# Patient Record
Sex: Male | Born: 1950 | Race: White | Hispanic: No | Smoking: Former smoker
Health system: Southern US, Community
[De-identification: ages and names within clinical notes are randomized; demographics above are authoritative.]

## PROBLEM LIST (undated history)

## (undated) DIAGNOSIS — E7800 Pure hypercholesterolemia, unspecified: Secondary | ICD-10-CM

## (undated) DIAGNOSIS — Z9289 Personal history of other medical treatment: Secondary | ICD-10-CM

## (undated) DIAGNOSIS — B001 Herpesviral vesicular dermatitis: Secondary | ICD-10-CM

## (undated) DIAGNOSIS — N529 Male erectile dysfunction, unspecified: Secondary | ICD-10-CM

## (undated) DIAGNOSIS — I10 Essential (primary) hypertension: Secondary | ICD-10-CM

## (undated) DIAGNOSIS — M1990 Unspecified osteoarthritis, unspecified site: Secondary | ICD-10-CM

## (undated) DIAGNOSIS — K635 Polyp of colon: Secondary | ICD-10-CM

## (undated) DIAGNOSIS — I209 Angina pectoris, unspecified: Secondary | ICD-10-CM

## (undated) DIAGNOSIS — Z85828 Personal history of other malignant neoplasm of skin: Secondary | ICD-10-CM

## (undated) DIAGNOSIS — F419 Anxiety disorder, unspecified: Secondary | ICD-10-CM

## (undated) DIAGNOSIS — C801 Malignant (primary) neoplasm, unspecified: Secondary | ICD-10-CM

## (undated) DIAGNOSIS — K219 Gastro-esophageal reflux disease without esophagitis: Secondary | ICD-10-CM

## (undated) DIAGNOSIS — I2510 Atherosclerotic heart disease of native coronary artery without angina pectoris: Secondary | ICD-10-CM

## (undated) HISTORY — DX: Male erectile dysfunction, unspecified: N52.9

## (undated) HISTORY — DX: Personal history of other medical treatment: Z92.89

## (undated) HISTORY — PX: SHOULDER SURGERY: SHX246

## (undated) HISTORY — PX: SKIN SURGERY: SHX2413

## (undated) HISTORY — PX: FINGER FRACTURE SURGERY: SHX638

## (undated) HISTORY — DX: Polyp of colon: K63.5

## (undated) HISTORY — PX: SKIN CANCER EXCISION: SHX779

## (undated) HISTORY — PX: CARDIAC CATHETERIZATION: SHX172

## (undated) HISTORY — PX: BACK SURGERY: SHX140

## (undated) HISTORY — PX: CERVICAL FUSION: SHX112

---

## 1998-08-23 ENCOUNTER — Ambulatory Visit (HOSPITAL_COMMUNITY): Admission: RE | Admit: 1998-08-23 | Discharge: 1998-08-23 | Payer: Self-pay | Admitting: Orthopedic Surgery

## 1998-08-23 ENCOUNTER — Encounter: Payer: Self-pay | Admitting: Orthopedic Surgery

## 1998-10-22 ENCOUNTER — Emergency Department (HOSPITAL_COMMUNITY): Admission: EM | Admit: 1998-10-22 | Discharge: 1998-10-22 | Payer: Self-pay | Admitting: Emergency Medicine

## 1999-08-21 ENCOUNTER — Emergency Department (HOSPITAL_COMMUNITY): Admission: EM | Admit: 1999-08-21 | Discharge: 1999-08-21 | Payer: Self-pay | Admitting: Emergency Medicine

## 2000-01-01 ENCOUNTER — Emergency Department (HOSPITAL_COMMUNITY): Admission: EM | Admit: 2000-01-01 | Discharge: 2000-01-01 | Payer: Self-pay | Admitting: *Deleted

## 2001-05-20 ENCOUNTER — Encounter: Payer: Self-pay | Admitting: Geriatric Medicine

## 2001-05-20 ENCOUNTER — Encounter: Admission: RE | Admit: 2001-05-20 | Discharge: 2001-05-20 | Payer: Self-pay | Admitting: Geriatric Medicine

## 2001-05-24 ENCOUNTER — Encounter: Payer: Self-pay | Admitting: Geriatric Medicine

## 2001-05-24 ENCOUNTER — Ambulatory Visit (HOSPITAL_COMMUNITY): Admission: RE | Admit: 2001-05-24 | Discharge: 2001-05-24 | Payer: Self-pay | Admitting: Geriatric Medicine

## 2002-03-24 ENCOUNTER — Encounter: Payer: Self-pay | Admitting: Orthopedic Surgery

## 2002-03-24 ENCOUNTER — Encounter: Admission: RE | Admit: 2002-03-24 | Discharge: 2002-03-24 | Payer: Self-pay | Admitting: Orthopedic Surgery

## 2002-03-26 ENCOUNTER — Encounter: Payer: Self-pay | Admitting: Orthopedic Surgery

## 2002-03-26 ENCOUNTER — Encounter: Admission: RE | Admit: 2002-03-26 | Discharge: 2002-03-26 | Payer: Self-pay | Admitting: Orthopedic Surgery

## 2003-11-04 ENCOUNTER — Encounter (INDEPENDENT_AMBULATORY_CARE_PROVIDER_SITE_OTHER): Payer: Self-pay | Admitting: Specialist

## 2003-11-04 ENCOUNTER — Ambulatory Visit (HOSPITAL_COMMUNITY): Admission: RE | Admit: 2003-11-04 | Discharge: 2003-11-04 | Payer: Self-pay | Admitting: Gastroenterology

## 2005-09-19 ENCOUNTER — Encounter: Admission: RE | Admit: 2005-09-19 | Discharge: 2005-09-19 | Payer: Self-pay | Admitting: Orthopedic Surgery

## 2005-09-20 ENCOUNTER — Encounter: Admission: RE | Admit: 2005-09-20 | Discharge: 2005-09-20 | Payer: Self-pay | Admitting: Orthopedic Surgery

## 2006-01-21 ENCOUNTER — Emergency Department (HOSPITAL_COMMUNITY): Admission: EM | Admit: 2006-01-21 | Discharge: 2006-01-22 | Payer: Self-pay | Admitting: Emergency Medicine

## 2006-09-21 ENCOUNTER — Ambulatory Visit (HOSPITAL_COMMUNITY)
Admission: RE | Admit: 2006-09-21 | Discharge: 2006-09-21 | Payer: Self-pay | Admitting: Physical Medicine and Rehabilitation

## 2007-01-04 ENCOUNTER — Ambulatory Visit (HOSPITAL_COMMUNITY): Admission: RE | Admit: 2007-01-04 | Discharge: 2007-01-06 | Payer: Self-pay | Admitting: Neurosurgery

## 2007-04-29 ENCOUNTER — Encounter: Admission: RE | Admit: 2007-04-29 | Discharge: 2007-04-29 | Payer: Self-pay | Admitting: Geriatric Medicine

## 2007-10-15 ENCOUNTER — Encounter: Admission: RE | Admit: 2007-10-15 | Discharge: 2007-10-15 | Payer: Self-pay | Admitting: Interventional Cardiology

## 2007-10-21 ENCOUNTER — Inpatient Hospital Stay (HOSPITAL_BASED_OUTPATIENT_CLINIC_OR_DEPARTMENT_OTHER): Admission: RE | Admit: 2007-10-21 | Discharge: 2007-10-21 | Payer: Self-pay | Admitting: Interventional Cardiology

## 2008-05-09 ENCOUNTER — Ambulatory Visit (HOSPITAL_COMMUNITY): Admission: RE | Admit: 2008-05-09 | Discharge: 2008-05-09 | Payer: Self-pay | Admitting: Orthopedic Surgery

## 2008-08-25 ENCOUNTER — Ambulatory Visit: Payer: Self-pay | Admitting: Family Medicine

## 2008-08-25 ENCOUNTER — Inpatient Hospital Stay (HOSPITAL_COMMUNITY): Admission: EM | Admit: 2008-08-25 | Discharge: 2008-08-26 | Payer: Self-pay | Admitting: Emergency Medicine

## 2008-11-07 DIAGNOSIS — Z9289 Personal history of other medical treatment: Secondary | ICD-10-CM

## 2008-11-07 HISTORY — DX: Personal history of other medical treatment: Z92.89

## 2008-11-20 ENCOUNTER — Ambulatory Visit: Payer: Self-pay | Admitting: *Deleted

## 2008-11-21 ENCOUNTER — Observation Stay (HOSPITAL_COMMUNITY): Admission: EM | Admit: 2008-11-21 | Discharge: 2008-11-23 | Payer: Self-pay | Admitting: Emergency Medicine

## 2009-01-04 ENCOUNTER — Encounter: Admission: RE | Admit: 2009-01-04 | Discharge: 2009-01-04 | Payer: Self-pay | Admitting: Neurosurgery

## 2009-03-22 ENCOUNTER — Encounter: Admission: RE | Admit: 2009-03-22 | Discharge: 2009-03-22 | Payer: Self-pay | Admitting: Orthopedic Surgery

## 2009-04-07 ENCOUNTER — Encounter: Admission: RE | Admit: 2009-04-07 | Discharge: 2009-04-07 | Payer: Self-pay | Admitting: Orthopedic Surgery

## 2009-04-13 ENCOUNTER — Inpatient Hospital Stay (HOSPITAL_COMMUNITY): Admission: RE | Admit: 2009-04-13 | Discharge: 2009-04-14 | Payer: Self-pay | Admitting: Neurosurgery

## 2010-10-13 LAB — CBC
HCT: 38.7 % — ABNORMAL LOW (ref 39.0–52.0)
MCV: 94 fL (ref 78.0–100.0)
Platelets: 160 10*3/uL (ref 150–400)
RBC: 4.11 MIL/uL — ABNORMAL LOW (ref 4.22–5.81)
RDW: 13.1 % (ref 11.5–15.5)
WBC: 4.5 10*3/uL (ref 4.0–10.5)

## 2010-10-13 LAB — BASIC METABOLIC PANEL
CO2: 31 mEq/L (ref 19–32)
Calcium: 9.3 mg/dL (ref 8.4–10.5)
Chloride: 106 mEq/L (ref 96–112)
Creatinine, Ser: 0.83 mg/dL (ref 0.4–1.5)
GFR calc Af Amer: >60 mL/min (ref >60)
Glucose, Bld: 93 mg/dL (ref 70–99)
Sodium: 142 mEq/L (ref 135–145)

## 2010-10-18 LAB — PROTIME-INR
INR: 1.1 (ref 0.00–1.49)
Prothrombin Time: 14.1 seconds (ref 11.6–15.2)

## 2010-10-18 LAB — BASIC METABOLIC PANEL
Calcium: 8.8 mg/dL (ref 8.4–10.5)
Chloride: 105 mEq/L (ref 96–112)
Creatinine, Ser: 0.91 mg/dL (ref 0.4–1.5)
GFR calc Af Amer: >60 mL/min (ref >60)

## 2010-10-18 LAB — CBC
MCHC: 34.8 g/dL (ref 30.0–36.0)
MCV: 89.8 fL (ref 78.0–100.0)
MCV: 88.7 fL (ref 78.0–100.0)
RBC: 3.92 MIL/uL — ABNORMAL LOW (ref 4.22–5.81)
RBC: 4.18 MIL/uL — ABNORMAL LOW (ref 4.22–5.81)
RDW: 12.7 % (ref 11.5–15.5)
WBC: 7.6 10*3/uL (ref 4.0–10.5)
WBC: 5 10*3/uL (ref 4.0–10.5)

## 2010-10-18 LAB — POCT CARDIAC MARKERS
CKMB, poc: <1 ng/mL — ABNORMAL LOW (ref 1.0–8.0)
Myoglobin, poc: 75 ng/mL (ref 12–200)

## 2010-10-18 LAB — LIPID PANEL
LDL Cholesterol: 84 mg/dL (ref 0–99)
Total CHOL/HDL Ratio: 2.7 RATIO
Triglycerides: 30 mg/dL (ref <150)
VLDL: 6 mg/dL (ref 0–40)

## 2010-10-18 LAB — DIFFERENTIAL
Lymphocytes Relative: 25 % (ref 12–46)
Lymphs Abs: 1.2 10*3/uL (ref 0.7–4.0)
Monocytes Relative: 8 % (ref 3–12)
Neutro Abs: 3.3 10*3/uL (ref 1.7–7.7)
Neutrophils Relative %: 67 % (ref 43–77)

## 2010-10-18 LAB — HEPARIN LEVEL (UNFRACTIONATED)
Heparin Unfractionated: <0.1 IU/mL — ABNORMAL LOW (ref 0.30–0.70)
Heparin Unfractionated: 0.54 IU/mL (ref 0.30–0.70)

## 2010-10-18 LAB — CARDIAC PANEL(CRET KIN+CKTOT+MB+TROPI)
CK, MB: 2.1 ng/mL (ref 0.3–4.0)
Relative Index: INVALID (ref 0.0–2.5)
Total CK: 68 U/L (ref 7–232)
Troponin I: 0.01 ng/mL (ref 0.00–0.06)

## 2010-10-25 LAB — TROPONIN I: Troponin I: <0.01 ng/mL (ref 0.00–0.06)

## 2010-10-25 LAB — POCT CARDIAC MARKERS
CKMB, poc: <1 ng/mL — ABNORMAL LOW (ref 1.0–8.0)
Troponin i, poc: <0.05 ng/mL (ref 0.00–0.09)

## 2010-10-25 LAB — CBC
MCHC: 34.9 g/dL (ref 30.0–36.0)
MCV: 89.9 fL (ref 78.0–100.0)
MCV: 89.3 fL (ref 78.0–100.0)
Platelets: 193 10*3/uL (ref 150–400)
Platelets: 191 10*3/uL (ref 150–400)
RDW: 13.3 % (ref 11.5–15.5)
RDW: 13.3 % (ref 11.5–15.5)
WBC: 7.9 10*3/uL (ref 4.0–10.5)
WBC: 7.4 10*3/uL (ref 4.0–10.5)

## 2010-10-25 LAB — COMPREHENSIVE METABOLIC PANEL
AST: 26 U/L (ref 0–37)
Albumin: 3.6 g/dL (ref 3.5–5.2)
Chloride: 105 mEq/L (ref 96–112)
Creatinine, Ser: 0.85 mg/dL (ref 0.4–1.5)
GFR calc Af Amer: >60 mL/min (ref >60)
Total Bilirubin: 0.4 mg/dL (ref 0.3–1.2)
Total Protein: 6 g/dL (ref 6.0–8.3)

## 2010-10-25 LAB — HEPATIC FUNCTION PANEL
ALT: 27 U/L (ref 0–53)
Bilirubin, Direct: <0.1 mg/dL (ref 0.0–0.3)
Total Protein: 6 g/dL (ref 6.0–8.3)

## 2010-10-25 LAB — BASIC METABOLIC PANEL
BUN: 8 mg/dL (ref 6–23)
BUN: 9 mg/dL (ref 6–23)
Calcium: 8.9 mg/dL (ref 8.4–10.5)
Creatinine, Ser: 0.88 mg/dL (ref 0.4–1.5)
Creatinine, Ser: 0.93 mg/dL (ref 0.4–1.5)
GFR calc non Af Amer: >60 mL/min (ref >60)
GFR calc non Af Amer: >60 mL/min (ref >60)
Glucose, Bld: 109 mg/dL — ABNORMAL HIGH (ref 70–99)
Glucose, Bld: 97 mg/dL (ref 70–99)

## 2010-10-25 LAB — DIFFERENTIAL
Basophils Absolute: 0 10*3/uL (ref 0.0–0.1)
Eosinophils Relative: 2 % (ref 0–5)
Lymphocytes Relative: 26 % (ref 12–46)
Lymphs Abs: 2.1 10*3/uL (ref 0.7–4.0)
Monocytes Absolute: 0.7 10*3/uL (ref 0.1–1.0)
Monocytes Relative: 9 % (ref 3–12)
Neutro Abs: 4.9 10*3/uL (ref 1.7–7.7)

## 2010-10-25 LAB — LIPID PANEL
HDL: 49 mg/dL (ref >39)
LDL Cholesterol: 98 mg/dL (ref 0–99)
Total CHOL/HDL Ratio: 3.3 RATIO
Triglycerides: 73 mg/dL (ref <150)
VLDL: 15 mg/dL (ref 0–40)

## 2010-10-25 LAB — MAGNESIUM: Magnesium: 2.3 mg/dL (ref 1.5–2.5)

## 2010-10-25 LAB — PROTIME-INR: INR: 1 (ref 0.00–1.49)

## 2010-10-25 LAB — HEPARIN LEVEL (UNFRACTIONATED): Heparin Unfractionated: 0.59 IU/mL (ref 0.30–0.70)

## 2010-10-25 LAB — CK TOTAL AND CKMB (NOT AT ARMC)
CK, MB: 0.8 ng/mL (ref 0.3–4.0)
Relative Index: INVALID (ref 0.0–2.5)
Total CK: 38 U/L (ref 7–232)

## 2010-10-25 LAB — APTT: aPTT: 86 seconds — ABNORMAL HIGH (ref 24–37)

## 2010-11-22 NOTE — H&P (Signed)
Edward [name], Edward [name] NO.:  [id number]   MEDICAL RECORD NO.:  [id number]          PATIENT TYPE:  INP   LOCATION:  6525                         FACILITY:  [facility]   PHYSICIAN:  Vernice Jefferson, MD          DATE OF BIRTH:  [birth year]   DATE OF ADMISSION:  08/24/2008  DATE OF DISCHARGE:                              HISTORY & PHYSICAL   REASON FOR ADMISSION:  Chest pain x3 days.   HISTORY OF PRESENT ILLNESS:  The patient is a 60 year old white male  with history of nonobstructive moderate coronary artery disease at last  cath in March 2009 who comes in with complaints of chest discomfort.  Reports that his chest discomfort has been in a crescendo pattern over  the past 3 days.  Reportedly gets chest pain about every 1-2 months;  however, over the past 3 days, the patient reports it has been occurring  about every 4-6 hours.  Reports this chest pain has been occurring with  rest and exertion.  Does not have any associated symptoms.  Additionally, he reports that chest pain has been somewhat relieved with  nitroglycerin occasionally.  Today, the chest pain was at its worse at  10 and presented to the ED and resolved while waiting here in the ED and  after receiving nitroglycerin.  The patient currently is chest pain  free.   PAST MEDICAL HISTORY:  1. Coronary artery disease status post left heart catheterization      secondary to abnormal nuclear study in April 2009.  At that time,      he had a 50-70% mid LAD, a 70% small OM-1 lesion, and a 50% mid      RCA.  His left ventricular systolic function was 75% by left      ventriculogram.  2. Hypertension.  3. DJD.  4. Depression.   SOCIAL HISTORY:  Lives in [place].  Negative for any smoking,  alcohol, or drug abuse.   FAMILY HISTORY:  Reviewed and is noncontributory of the patient's  current medical condition.   ALLERGIES:  No known drug allergies.   MEDICATIONS:  1. Aspirin 81 mg a day.  2. Protonix 40 mg a  day.  3. Zocor 80 mg nightly.  4. Avapro 40 mg a day.  5. Wellbutrin 50 mg a day.  6. Fish oil 1 g b.i.d.   REVIEW OF SYSTEMS:  Negative 11-point review of systems except for those  dictated in the above HPI.   PHYSICAL EXAMINATION:  VITAL SIGNS:  Blood pressure is 168/84, heart  rate of 115, respirations 12.  He is afebrile.  GENERAL:  Well-developed, well-nourished white man, in no acute  distress.  HEENT:  Moist mucous membranes.  No scleral icterus or conjunctival  pallor.  NECK:  Supple.  Full range of motion.  No jugular venous distention.  CARDIOVASCULAR:  Regular rate and rhythm.  No murmurs, rubs, or gallops.  CHEST:  Clear to auscultation bilaterally.  No wheezes, rales, or  rhonchi.  ABDOMEN: Soft, nontender, nondistended.  Normoactive bowel sounds.  EXTREMITIES:  No peripheral edema.  Pulses 2+ bilaterally.  NEURO:  Nonfocal.   His EKG demonstrates sinus tachycardia but no acute changes.  Chest x-  ray demonstrates no acute infiltrative process.   LABORATORY DATA:  Significant for hemoglobin of 14.4, white count of  7.9.  BUN and creatinine 11.83.  His first set of biomarkers are  negative.   IMPRESSION:  1. Acute coronary syndrome, unstable angina.  2. Nonobstructive coronary artery disease, but last left heart      catheterization were benign.  3. Hypertension.  4. Gastroesophageal reflux disease.   PLAN:  Admit the patient to hospital for Dr. Verdis Prime.  We will cycle  biomarkers, check a.m. EKG, and initiate heparin protocol for acute  coronary syndrome given possibility of acute plaque ruptures etiology of  her chest pain.  Pending biomarkers, eval an EKG.  May consider  noninvasive versus invasive risk stratification in the a.m.  We will  give him morphine and nitroglycerin as needed for pain.      Vernice Jefferson, MD  Electronically Signed     Vernice Jefferson, MD  Electronically Signed    JT/MEDQ  D:  08/25/2008  T:  08/25/2008  Job:  [id number]

## 2010-11-22 NOTE — Cardiovascular Report (Signed)
Edward [name], Edward [name] NO.:  [id number]   MEDICAL RECORD NO.:  [id number]          PATIENT TYPE:  INP   LOCATION:  6525                         FACILITY:  [facility]   PHYSICIAN:  Lyn Records, M.D.   DATE OF BIRTH:  Mar 19, 1951   DATE OF PROCEDURE:  08/25/2008  DATE OF DISCHARGE:                            CARDIAC CATHETERIZATION   INDICATIONS FOR PROCEDURE:  Progressive angina pectoris in this 60-year-  old gentleman with a history of moderate coronary artery disease  documented by prior catheterization within the past 12 months.   PROCEDURES PERFORMED:  1. Left heart catheterization.  2. Selective coronary angiography.  3. Left ventriculography.   DESCRIPTION:  A 6-French sheath was placed in the right femoral artery  using the modified Seldinger technique.  A 6-French A2 multipurpose  catheter was used for hemodynamic recordings, left ventriculography by  hand injection, and selective left and right coronary angiography.  We  used a #4 6-French left coronary catheter for left coronary angiography.  Intracoronary nitroglycerin 200 mcg was administered without  complications.  The case was terminated after reviewing the digital  images.   RESULTS:  1. Hemodynamic data:      a.     Aortic pressure 94/54.      b.     Left ventricular pressure 101/2 mmHg.  2. Left ventriculography:  The left ventricular cavity size is normal.      The systolic function is normal.  The ejection fraction is 60%.  No      mitral regurgitation is noted.  3. Coronary angiography.      a.     Left main coronary:  The left main coronary artery is       patent, but contains 30% proximal narrowing.      b.     Left anterior descending coronary:  The LAD is a large       vessel that reaches around the left ventricular apex.  It gives       origin to a very early first diagonal branch.  There is ostial       eccentric 60-70% narrowing in the first diagonal.  There is       diffuse mid  LAD narrowing up to 60-70% as well as diffuse second       diagonal narrowing.  No high-grade focal stenosis is noted.      c.     Circumflex artery:  The circumflex coronary artery gives       origin to 4 obtuse marginal branches.  The first 2 of which are       relatively large.  The second branch contains severe ostial and       proximal narrowing greater than 90%.  The fourth and fifth obtuse       marginal branches are large and free of significant obstruction.      d.     Right coronary:  The right coronary artery is dominant and       gives origin to a mid eccentric 40% narrowing.  The  PDA and left       ventricular branches are free of any significant obstruction.   CONCLUSIONS:  1. Moderately severe mid left anterior descending and second diagonal      atherosclerosis.  Moderate obstruction in the ostium of the large      first diagonal.  Moderate mid circumflex obstruction with severe      second obtuse marginal obstruction.  The second obtuse marginal is      small and could potentially be the source of the patient's pain.      There is mild-to-moderate mid right coronary artery disease.  When      compared to the prior study, there is      not a significant change.  2. Overall normal left ventricular function.   PLAN:  Intensify antianginal regimen by adding long-acting nitrate  therapy.      Lyn Records, M.D.  Electronically Signed     HWS/MEDQ  D:  08/25/2008  T:  08/25/2008  Job:  [id number]

## 2010-11-22 NOTE — Discharge Summary (Signed)
NAMEKEELYN, FJELSTAD NO.:  [id number]   MEDICAL RECORD NO.:  [id number]          PATIENT TYPE:  INP   LOCATION:  2313                         FACILITY:  [facility]   PHYSICIAN:  Lyn Records, M.D.   DATE OF BIRTH:  June 15, 1951   DATE OF ADMISSION:  11/20/2008  DATE OF DISCHARGE:  11/23/2008                               DISCHARGE SUMMARY   DISCHARGE DIAGNOSES:  1. Chest pain, resolved.  2. Coronary artery disease, status post Cardiolite this admission for      ischemia, negative.  3. Hyperlipidemia, treated.  4. Hypertension, treated.   HOSPITAL COURSE:  Mr. Gloster is a 60 year old male patient who has known  coronary artery disease.  He was recently discharged in February with  the cath showing a 25% left main, LAD with diffuse mid and diagonal  lesions up to 70%, a left circumflex was small with 60-70% stenosis.  The OM2 had 90% stenosis.  He was treated medically.   He is readmitted, and he states for chest pain and we did a Cardiolite  specifically in these areas were ischemic in nature, but they were not.  He was going to be discharged home, but he again started having chest  discomfort, but a GI cocktail seemed to help him.   MEDICATIONS:  We did have him on Lopressor 25 mg a day, he became  bradycardic in the 40s later that morning, which I assume was from his  sleep, but we will not go ahead and keep him on his medication.  Instead, I will increase his Imdur to 60 mg a day and he will otherwise  take these medications.  1. Enteric-coated aspirin 325 mg a day.  2. Bupropion HCl 150 mg 2 tablets daily.  3. Protonix 40 mg a day.  4. Avapro 150 mg a day.  5. Isosorbide mononitrate 30 mg 2 tablets daily.  6. Hydrocodone p.r.n.  7. Zocor 80 mg p.o. daily.  8. Sublingual nitroglycerin p.r.n. pain.  9. Ambien 10 mg as needed for sleep.   DISCHARGE INSTRUCTIONS:  The patient is to remain on low-sodium, heart-  healthy diet.  Return to work on Nov 24, 2008.   To remain on low-sodium,  heart-healthy diet.  Increase activity slowly.  Follow up with Dr.  Effie Shy, nurse practitioner on Nov 26, 2008, at 2:40 p.m.      Guy Franco, P.A.      Lyn Records, M.D.  Electronically Signed    LB/MEDQ  D:  11/23/2008  T:  11/23/2008  Job:  [id number]   cc:   Lyn Records, M.D.

## 2010-11-22 NOTE — Discharge Summary (Signed)
Edward [name], Edward [name] NO.:  [id number]   MEDICAL RECORD NO.:  [id number]          PATIENT TYPE:  INP   LOCATION:  6525                         FACILITY:  [facility]   PHYSICIAN:  Lyn Records, M.D.   DATE OF BIRTH:  12-07-1950   DATE OF ADMISSION:  08/24/2008  DATE OF DISCHARGE:  08/26/2008                               DISCHARGE SUMMARY   DISCHARGE DIAGNOSES:  1. Chest pain, resolved.  2. Coronary artery disease, medical management.  3. Hypertension.  4. Degenerative joint disease.  5. Hyperlipidemia.   HOSPITAL COURSE:  [name] is a 60 year old male patient who has had  crescendo angina complaint.  He was admitted to the hospital and his EKG  was unremarkable.  Lab studies showed cardiac markers which were  negative.  He does have total cholesterol of 162, LDL 98, HDL 49,  triglycerides 93.  Sodium 138, potassium 3.9, BUN 9, creatinine 0.93.  Hemoglobin 14.7, hematocrit 42, white count 7.4, platelets 191.  TSH  2.415.  Because of his complaint and he has had a past cath by [facility] several years ago and had nonobstructive disease, we felt  that it was prudent to go ahead and restudy him.   Cardiac catheterization showed normal EF.  Left main 25%, LAD with  diffuse proximal, and mid up to 70% stenosis with a first diagonal with  a 70% ostial stenosis.  The circumflex had an OM2 with a 90% stenosis  and had a small mid circumflex lesion 60-70%.  RCA had 50% mid stenosis.  Dr. Katrinka Blazing felt that the patient had moderate coronary artery disease,  but felt that this could be managed medically and thought placing him on  Imdur.   He was discharged to home on August 26, 2008, in stable, but improved  condition.   DISCHARGE MEDICATIONS:  1. Imdur 30 mg a day.  2. Sublingual nitroglycerin p.r.n. chest pain.  3. Enteric-coated aspirin 325 mg a day.  4. Protonix 40 mg a day.  5. Zocor 80 mg a day.  6. Avapro 150 mg a day.  7. Fish oil daily.  8.  Wellbutrin daily.   Remain on a low-sodium, heart-healthy diet.  Clean cath site gently with  soap and water without scrubbing.  Increase activity slowly.  No lifting  over 10 pounds for 1 week.  No driving for 2 days.  Follow up with Dr.  Effie Shy, nurse practitioner on September 09, 2008 at 9:30 a.m.      Guy Franco, P.A.      Lyn Records, M.D.  Electronically Signed    LB/MEDQ  D:  08/26/2008  T:  08/26/2008  Job:  [id number]

## 2010-11-22 NOTE — Op Note (Signed)
NAMECALI, [name] NO.:  [id number]   MEDICAL RECORD NO.:  [id number]          PATIENT TYPE:  OIB   LOCATION:  5153                         FACILITY:  [facility]   PHYSICIAN:  Danae Orleans. Venetia Maxon, M.D.  DATE OF BIRTH:  09/01/1950   DATE OF PROCEDURE:  01/04/2007  DATE OF DISCHARGE:                               OPERATIVE REPORT   PREOPERATIVE DIAGNOSIS:  Herniated lumbar disc L5-S1, right, with  spondylosis, degenerative disease, and radiculopathy.   POSTOPERATIVE DIAGNOSIS:  Herniated lumbar disc L5-S1, right, with  spondylosis, degenerative disease, and radiculopathy.   PROCEDURE:  Right L5-S1 microdiscectomy with microdissection.   ASSISTANT:  Danae Orleans. Venetia Maxon, M.D.   ASSISTANT:  Cristi Loron, M.D.   ANESTHESIA:  General endotracheal anesthesia.   BLOOD LOSS:  Minimal.   COMPLICATIONS:  None.   DISPOSITION:  Recovery.   INDICATIONS:  Edward [name] is a 60 year old man with a right L5-S1 disc  herniation with severe right leg pain.  It was elected to take him to  surgery for right L5-S1 microdiscectomy.   DESCRIPTION OF PROCEDURE:  Edward [name] was brought to the operating room.  Following the satisfactory and uncomplicated induction of general  endotracheal anesthesia and placement of intravenous lines, the patient  was placed in the prone position on the operating table.  His low back  was shaved then prepped and draped in the usual sterile fashion.  The  area of planned incision was infiltrated with 0.25% Marcaine, 0.5%  lidocaine, 1:200,000 epinephrine.  An incision was made in the midline  and carried to the lumbodorsal fascia which was incised to the right  side of midline.  Subperiosteal dissection was performed exposing the L5-  S1 interspace.  Intraoperative x-ray confirmed correct orientation with  the marker probe at the L5-S1 level.  Subsequently, a hemilaminectomy of  L5 was performed with removal of inferior and lateral aspect of the L5  lamina.  A foraminotomy was performed overlying the superior aspect of  the sacrum. The ligamentum flavum was then detached and removed in a  piecemeal fashion.  The lateral recess was also decompressed.  The  microscope was brought into field. Using microdissection technique, the  S1 nerve root was mobilized medially exposing a thinly contained  fragment of herniated disc material directly beneath the take off of the  S1 nerve root.  This fragment of disc material was then removed.  This  lead directly into the interspace and the disc was quite degenerated.  Multiple additional fragments were removed.  The lateral and medial  aspects of the interspace were also decompressed.  Hemostasis was  assured.  The disc space was irrigated with no evidence of residual disc  material.  The operative site was then bathed in Depo-Medrol and  fentanyl.  The lumbodorsal fascia was closed with 0 Vicryl sutures, the  subcutaneous tissues were approximated 2-0  Vicryl interrupted inverted sutures, and skin edges were approximated 3-  0 Vicryl subcuticular stitch.  The wound was dressed with Dermabond.  The patient was extubated in the operating room and taken to  the  recovery room in stable satisfactory having tolerated the operation  well.  Counts were correct at the end of the case.      Danae Orleans. Venetia Maxon, M.D.  Electronically Signed     JDS/MEDQ  D:  01/04/2007  T:  01/05/2007  Job:  [id number]

## 2010-11-22 NOTE — Cardiovascular Report (Signed)
NAMEJENNINGS, CORADO NO.:  [id number]   MEDICAL RECORD NO.:  [id number]          PATIENT TYPE:  OIB   LOCATION:  1962                         FACILITY:  [facility]   PHYSICIAN:  Lyn Records, M.D.   DATE OF BIRTH:  1951-02-14   DATE OF PROCEDURE:  10/21/2007  DATE OF DISCHARGE:  10/21/2007                            CARDIAC CATHETERIZATION   INDICATION:  Atypical chest pain with Cardiolite study done on  08/28/2007 with perfusion abnormalities in the mid-anterior wall and  inferobasal region.  Normal LVEF.  The study is being done to define  coronary anatomy given the patient's risk factors including a strong  family history of premature atherosclerosis.   PROCEDURES PERFORMED:  1. Left heart catheterization.  2. Selective coronary angiography.  3. Left ventriculography.  4. Intracoronary nitroglycerin.   DESCRIPTION:  After informed consent, a 4-French sheath was placed in  the right femoral artery using modified Seldinger technique.  A 4-French  A2 multipurpose catheter was used for hemodynamic recordings, left  ventriculography by hand injection, and selective right coronary  angiography.  We used a JL4 and a JR4, 4-French catheters for left and  right coronary angiography respectively.  200 mcg of intracoronary  nitroglycerin was administered into the left coronary.  The patient  tolerated the procedure without complications.   The patient received 3 mg of IV Versed and 50 mcg of fentanyl for  sedation.  Xylocaine 1% was used for local anesthesia.   Bimanual compression was used for hemostasis with good results.   RESULTS:  1. Hemodynamic data:      a.     Aortic pressure 142/74.      b.     Left ventricular pressure 141/12.  2. Left ventriculography:  The LV cavity size and function are normal.      EF is 75%.  3. Coronary angiography.      a.     Left main coronary:  Left main coronary artery is widely       patent.      b.     Left anterior  descending coronary:  Left anterior descending       coronary artery is large.  It divides into a large first diagonal       that really almost has the distribution of the ramus intermedius       branch.  There is also a moderate-sized second diagonal.  The LAD       in the mid segment is moderately and acutely diseased.  The first       diagonal contains ostial 50% narrowing.  The LAD beyond the first       diagonal contains diffuse 50-70% narrowing.  The second diagonal       beyond the paracentral periphery origin of the LAD contains       moderate diffuse disease, but no high-grade obstruction.  No high-       grade focal obstructions were noted in the LAD or either the large       diagonal.  c.     Circumflex artery:  The circumflex coronary artery is large.       It gives origin to three obtuse marginal branches.  The first       obtuse marginal branch contains 70% ostial and proximal narrowing.       Of the obtuse marginal branches noted, the first, second, and       third, which begin to arise from the circumflex are all equal in       size.  Left atrial recurrent branch arises from the mid       circumflex.      d.     Right coronary:  The right coronary artery contains an       eccentric 50% mid vessel stenosis.  It gives origin to PDA branch       and three left ventricular branches.  No significant obstructive       lesions were noted in the distal vessel.   CONCLUSIONS:  1. The patient has moderately severe coronary atherosclerosis without      focal high-grade obstruction in the major epicardial coronaries.      The involvement includes primarily the mid right coronary, the mid      LAD, and the mid circumflex.  There is high-grade obstruction in      the small first obtuse marginal and also 50% obstruction in the      first diagonal, which is a large vessel that rises near the ostium      of the left main.  2. Normal left ventricular function.   PLAN:  Aggressive  risk factor modification.  Nitropress for episodes of  chest discomfort, as the patient would be predisposed to endothelial  dysfunction.      Lyn Records, M.D.  Electronically Signed     HWS/MEDQ  D:  10/21/2007  T:  10/22/2007  Job:  [id number]

## 2010-11-22 NOTE — H&P (Signed)
NAMETAL, KEMPKER NO.:  [id number]   MEDICAL RECORD NO.:  [id number]          PATIENT TYPE:  INP   LOCATION:  2313                         FACILITY:  [facility]   PHYSICIAN:  Jennelle Human. Marisue Humble, MD DATE OF BIRTH:  [birth year]   DATE OF ADMISSION:  11/20/2008  DATE OF DISCHARGE:                              HISTORY & PHYSICAL   CARDIOLOGIST:  Lyn Records, MD, [facility].   CHIEF COMPLAINT:  Chest pain.   HISTORY OF PRESENT ILLNESS:  This is a 60 year old gentleman with known  moderate coronary artery disease who started having chest pain around  7:00 p.m. with which nitro did not relieve.  He felt short of breath and  diaphoretic.  He later approximately an hour later to be exact took an  aspirin 325 and another nitroglycerin without any benefit.  He left the  function that he was at and felt presyncopal on the drive home, so he  stopped at a fire station where he was subsequently given nitroglycerin  again and brought in via ambulance.  His pain is now down to 2-3 from an  8/10 on nitroglycerin drip and heparin.   PAST MEDICAL HISTORY:  1. Moderate-to-severe coronary artery disease.  He was recently      hospitalized in February for unstable angina.  At this time, he was      found to have a 25% left main and LAD with diffuse proximal and mid      disease up to 70% with the first diagonal of 70% as well.  His      circumflex was small in the midportion approximately 60-70%, and he      had an OM-2 that was small and approximately 90% stenosed.  His RCA      had 50% mid stenosis.  At that time, no PCI was done and he was      medically managed.  2. Hypertension.  3. Degenerative joint disease.  4. Hyperlipidemia.   SOCIAL HISTORY:  Lives in [place].  No tobacco or alcohol products.   FAMILY HISTORY:  With known coronary artery disease.   REVIEW OF SYSTEMS:  Negative x10 except that stated in the HPI.   ALLERGIES:  No allergies.    MEDICATIONS:  1. Imdur 30 mg daily.  2. Aspirin 325 mg daily.  3. Protonix 40 mg daily.  4. Zocor 80 mg daily.  5. Avapro 150 mg daily.  6. Fish oil 1 g daily.  7. Wellbutrin 300 mg daily.   PHYSICAL EXAMINATION:  VITAL SIGNS:  Currently afebrile, pulse is 75,  respirations 16, blood pressure 116/60.  HEENT:  Normal.  NECK:  Normal jugular venous pressure.  Carotid upstrokes normal.  No  bruits.  CARDIOVASCULAR:  Regular rate and rhythm without murmurs, gallops, or  rubs.  LUNGS:  Clear to auscultation bilaterally.  ABDOMEN:  Soft, nondistended, nontender.  Good bowel sounds.  No  hepatosplenomegaly.  EXTREMITIES:  No clubbing, cyanosis, or edema.  Dorsalis pedis,  posterior tibial pulses are normal.   Chest x-ray, normal.  ECG shows normal  sinus rhythm without any evidence  of ST or T-wave changes.   Labs show a white count of 5 and H and H of 12.8 and 37, platelets 166.  Sodium is 138, potassium 3.4, chloride 105, bicarb 23, BUN and  creatinine is 11 and 0.91.  INR is 1.1.  First set of cardiac enzymes  are negative.   ASSESSMENT AND PLAN:  1. Unstable angina.  We will continue to cycle his cardiac enzymes.      He is currently on a heparin drip and has been loaded with aspirin.      He has been treated with Imdur at home; however, currently he is on      a nitroglycerin drip.  In addition, I will add a beta-blocker to      his regimen.  2. Hypertension, currently controlled.  3. Hyperlipidemia.  We will check fasting lipid panel in the morning.  4. Followup.  Currently, he will be placed on Stepdown since he is on      a nitroglycerin drip and be closely monitored.  We will follow up      on his cardiac enzymes.      Jennelle Human Marisue Humble, MD  Electronically Signed     GBS/MEDQ  D:  11/21/2008  T:  11/21/2008  Job:  [id number]

## 2010-11-25 NOTE — Op Note (Signed)
NAME:  Edward [name], Edward [name]                             ACCOUNT NO.:  [id number]   MEDICAL RECORD NO.:  [id number]                   PATIENT TYPE:  AMB   LOCATION:  ENDO                                 FACILITY:  [facility]   PHYSICIAN:  Danise Edge, M.D.                DATE OF BIRTH:  07/10/1951   DATE OF PROCEDURE:  11/04/2003  DATE OF DISCHARGE:                                 OPERATIVE REPORT   PROCEDURE:  Esophagogastroduodenoscopy, colonoscopy and polypectomy.   INDICATIONS:  Edward [name] is a 60 year old male, born 1950-12-28.  Mr.  Edward [name] has chronic gastroesophageal reflux manifested by heartburn.  He is  taking a proton pump inhibitor.  When he takes the proton pump inhibitor, he  does not experience heartburn, dysphagia or odynophagia.   Edward [name] has undergone hemorrhoid surgery in the past.  He has passed fresh  blood with otherwise normal bowel movements.  He is scheduled to undergo his  first screening colonoscopy with polypectomy to prevent colon cancer.   ENDOSCOPIST:  Danise Edge, M.D.   PREMEDICATION:  Versed 10 mg, Demerol 100 mg for both procedures.   ESOPHAGOGASTRODUODENOSCOPY:  After obtaining informed consent, Edward [name] was  placed on the left lateral decubitus position.  I administered intravenous  Demerol and intravenous Versed to achieve conscious sedation for the  procedure.  The patient's blood pressure, oxygen saturation and cardiac  rhythm were monitored throughout the procedure and documented in the medical  record.   The Olympus gastroscope was passed through the posterior hypopharynx into  the proximal esophagus without difficulty.  The hypopharynx, larynx and  vocal cords appeared normal.   Esophagoscopy:  The proximal, mid and lower segments of the esophageal  mucosa appear normal.  The squamocolumnar junction and esophagogastric junction are noted at 40 cm  from the incisor teeth.  Endoscopically there is no evidence for the  presence of  erosive esophagitis, Barrett's esophagus, esophageal mucosal  scarring, or esophageal obstruction.   Gastroscopy:  Retroflexed view of the gastric cardia and fundus was normal.  The gastric body, antrum and pylorus appeared normal.   Duodenoscopy:  The duodenal bulb, mid duodenum and distal duodenum appeared  normal.   ASSESSMENT:  Chronic gastroesophageal reflux associated with a completely  normal esophagogastroduodenoscopy.   PROCTOCOLONOSCOPY WITH POLYPECTOMY:  Anal inspection and digital rectal exam  were normal.  The prostate was nonnodular.  The Olympus adjustable pediatric  colonoscope was introduced into the rectum and advanced to the cecum.  Colonic preparation for the exam today was excellent.   Rectum:  From the distal rectum, a 2 mm sessile polyp was removed with the  electrocautery snare.  Sigmoid colon and descending colon:  From the distal sigmoid colon, a 1 mm  sessile polyp was removed with the cold biopsy forceps.  Splenic flexure:  Normal.  Transverse colon:  Normal.  Hepatic flexure:  Normal.  Ascending colon:  Normal.  Cecum and ileocecal valve:  Normal.   ASSESSMENT:  A small polyp was removed from the distal rectum and a  diminutive polyp was removed from the distal sigmoid colon.  There is no  endoscopic evidence for the presence of colorectal cancer.  There is no  lower gastrointestinal bleeding.   RECOMMENDATIONS:  Repeat colonoscopy in five years if polyps return  neoplastic pathologically.                                               Danise Edge, M.D.    MJ/MEDQ  D:  11/04/2003  T:  11/04/2003  Job:  [id number]   cc:   Hal T. Stoneking, M.D.  [address] [place], [place] [address]  Fax: [phone]

## 2011-04-26 LAB — CBC
Platelets: 241
RDW: 12.6
WBC: 6.3

## 2011-05-31 NOTE — H&P (Signed)
Natale Milch DOB: February 22, 1951 Single / Language: Undefined / Race: Undefined Male   History of Present Illness The patient is a 60 year old male who presents today for follow up of their knee. The patient is being followed for their right knee pain. Symptoms reported today include: pain. The patient feels that they are doing well (The injection helped some but he still has pain.). The patient presents today following MRI.    Subjective  Natale Milch came in. His knee is still locked on the right. He can not totally extend his knee. He said he feels like something is catching in his knee.   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 12/22/1992 Pain in joint, shoulder (719.41). 03/27/1997 Disorder, shoulder region NEC (726.2). 06/16/1997 Sprain/strain, shoulder/arm NEC (840.8). 09/21/1997 Sprain/strain, rotator cuff (840.4). 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 02/05/1998 Degeneration, cervical disc (722.4). 08/19/1998 Pain in joint, upper arm (719.42). 09/28/1998 Epicondylitis, lateral (726.32). 10/21/1998 Wound open, finger w/o complication (883.0). 01/02/2000 Lumbago (724.2). 12/14/2006   Allergies No Known Drug Allergies.    Family History Cerebrovascular Accident. sister Depression. sister Congestive Heart Failure. mother and sister Heart Disease. mother and sister Diabetes Mellitus. sister Heart disease in male family member before age 58 Rheumatoid Arthritis. mother Hypertension. mother and sister   Social History Drug/Alcohol Rehab (Currently). 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  no Exercise. Exercises daily; does running / walking Children. 2 Alcohol use. former drinker Marital status. divorced Pain Contract. yes Number of flights of stairs before winded. 2-3 Tobacco use. Never smoker. former smoker; smoke(d) less than 1/2 pack(s) per day No alcohol use   Medication History Vicodin (5-500MG  Tablet, Oral) Active. Aspirin ( Oral) Specific dose unknown - Active. BuPROPion HCl (SR) (150MG  Tablet ER 12HR, Oral) Active. Protonix ( Oral) Specific dose unknown - Active. Avapro (150MG  Tablet, Oral) Active. Isosorbide Mononitrate CR (30MG  Tablet ER 24HR, Oral) Active. Fish Oil Active. Crestor ( Oral) Specific dose unknown - Active. CoQ10 Maximum Strength ( Oral) Specific dose unknown - Active. Multivitamin ( Oral) Specific dose unknown - Active. Nitroglycerin CR ( Oral) Specific dose unknown - Active.   Past Surgical History Spinal Surgery Spinal Decompression. neck and lower back Anal Fissure Repair Arthroscopy of Shoulder. left Rotator Cuff Repair. bilateral Neck Disc Surgery   Other Problems Migraine Headache Hypercholesterolemia Skin Cancer Coronary artery disease Anxiety Disorder Depression High blood pressure Gastroesophageal Reflux Disease   Objective  He has a knee effusion. His collateral ligaments and cruciates are intact. Popliteal space is normal. Circulation is intact. Calves soft, nontender, no phlebitis. Hip is negative.    RADIOGRAPHS: I went through the MRI report here. He has a flap tear of the posterior horn of the medial meniscus. He has some arthritic changes in the knee, about 50% of cartilage thickness of the femoral trochlear groove is involved. He has a large knee effusion, 6 mm wide posterior intra-articular osteochondral loose body.    Plans  He is going to need to have as we discussed arthroscopic medial meniscectomy and clean out of the right knee, general anesthesia. We went over the possible  complications today which are rare such as infection, blood clots, etc. As I mentioned to Sahli those are extremely rare. The patient wants to wait until after Christmas so we will write  this all up and have it ready. He will buy a bottle of adult aspirin 325 mg. He will start taking that after surgery. I do not want him on aspirin at least two to three days prior to surgery.    Jacki Cones, MD

## 2011-06-02 ENCOUNTER — Encounter (HOSPITAL_COMMUNITY): Payer: Self-pay

## 2011-06-12 ENCOUNTER — Other Ambulatory Visit: Payer: Self-pay

## 2011-06-12 ENCOUNTER — Ambulatory Visit (HOSPITAL_COMMUNITY)
Admission: RE | Admit: 2011-06-12 | Discharge: 2011-06-12 | Disposition: A | Discharge status: Home / Self Care | Payer: Medicare Other | Source: Ambulatory Visit | Attending: Orthopedic Surgery | Admitting: Orthopedic Surgery

## 2011-06-12 ENCOUNTER — Encounter (HOSPITAL_COMMUNITY)
Admission: RE | Admit: 2011-06-12 | Discharge: 2011-06-12 | Disposition: A | Discharge status: Home / Self Care | Payer: Medicare Other | Source: Ambulatory Visit | Attending: Orthopedic Surgery | Admitting: Orthopedic Surgery

## 2011-06-12 ENCOUNTER — Encounter (HOSPITAL_COMMUNITY): Payer: Self-pay

## 2011-06-12 HISTORY — DX: Anxiety disorder, unspecified: F41.9

## 2011-06-12 HISTORY — DX: Gastro-esophageal reflux disease without esophagitis: K21.9

## 2011-06-12 HISTORY — DX: Unspecified osteoarthritis, unspecified site: M19.90

## 2011-06-12 HISTORY — DX: Essential (primary) hypertension: I10

## 2011-06-12 HISTORY — DX: Pure hypercholesterolemia, unspecified: E78.00

## 2011-06-12 HISTORY — DX: Atherosclerotic heart disease of native coronary artery without angina pectoris: I25.10

## 2011-06-12 HISTORY — DX: Malignant (primary) neoplasm, unspecified: C80.1

## 2011-06-12 LAB — CBC
HCT: 39.2 % (ref 39.0–52.0)
Hemoglobin: 13.8 g/dL (ref 13.0–17.0)
RDW: 12.8 % (ref 11.5–15.5)
WBC: 5.1 10*3/uL (ref 4.0–10.5)

## 2011-06-12 LAB — COMPREHENSIVE METABOLIC PANEL
ALT: 15 U/L (ref 0–53)
AST: 16 U/L (ref 0–37)
Alkaline Phosphatase: 53 U/L (ref 39–117)
CO2: 27 mEq/L (ref 19–32)
Calcium: 9.4 mg/dL (ref 8.4–10.5)
GFR calc Af Amer: >90 mL/min (ref >90)
GFR calc non Af Amer: >90 mL/min (ref >90)
Glucose, Bld: 79 mg/dL (ref 70–99)
Potassium: 4.2 mEq/L (ref 3.5–5.1)
Sodium: 142 mEq/L (ref 135–145)

## 2011-06-12 LAB — SURGICAL PCR SCREEN
MRSA, PCR: NEGATIVE
Staphylococcus aureus: NEGATIVE

## 2011-06-12 LAB — DIFFERENTIAL
Basophils Absolute: 0 10*3/uL (ref 0.0–0.1)
Basophils Relative: 0 % (ref 0–1)
Lymphocytes Relative: 27 % (ref 12–46)
Monocytes Absolute: 0.5 10*3/uL (ref 0.1–1.0)
Monocytes Relative: 10 % (ref 3–12)
Neutro Abs: 3.1 10*3/uL (ref 1.7–7.7)
Neutrophils Relative %: 62 % (ref 43–77)

## 2011-06-12 LAB — URINALYSIS, ROUTINE W REFLEX MICROSCOPIC
Bilirubin Urine: NEGATIVE
Glucose, UA: NEGATIVE mg/dL
Hgb urine dipstick: NEGATIVE
Protein, ur: NEGATIVE mg/dL
Specific Gravity, Urine: 1.026 (ref 1.005–1.030)
Urobilinogen, UA: 0.2 mg/dL (ref 0.0–1.0)

## 2011-06-12 NOTE — Patient Instructions (Addendum)
20 Dashun LEE DEMITRIS POKORNY  06/12/2011   Your procedure is scheduled on:  12/04/121  Report to [facility] at 10:30 AM.  Call this number if you have problems the morning of surgery: [phone]   Remember:   Do not eat food:After Midnight.  May have clear liquids: up to 4 Hours before arrival.  Clear liquids include soda, tea, black coffee, apple or grape juice, broth.  Take these medicines the morning of surgery with A SIP OF WATER: WELLBUTRIN / IMDUR / RANEXA / VICODIN IF NEEDED   Do not wear jewelry, make-up or nail polish.  Do not wear lotions, powders, or perfumes. You may wear deodorant.  Do not shave 48 hours prior to surgery.  Do not bring valuables to the hospital.  Contacts, dentures or bridgework may not be worn into surgery.  Leave suitcase in the car. After surgery it may be brought to your room.  For patients admitted to the hospital, checkout time is 11:00 AM the day of discharge.   Patients discharged the day of surgery will not be allowed to drive home.  Name and phone number of your driver:   Special Instructions: CHG Shower Use Special Wash: 1/2 bottle night before surgery and 1/2 bottle morning of surgery.   Please read over the following fact sheets that you were given: MRSA Information

## 2011-06-13 ENCOUNTER — Encounter (HOSPITAL_COMMUNITY): Payer: Self-pay | Admitting: *Deleted

## 2011-06-13 ENCOUNTER — Ambulatory Visit (HOSPITAL_COMMUNITY)
Admission: RE | Admit: 2011-06-13 | Discharge: 2011-06-13 | Disposition: A | Discharge status: Home / Self Care | Payer: Medicare Other | Source: Ambulatory Visit | Attending: Orthopedic Surgery | Admitting: Orthopedic Surgery

## 2011-06-13 ENCOUNTER — Ambulatory Visit (HOSPITAL_COMMUNITY): Payer: Medicare Other | Admitting: Anesthesiology

## 2011-06-13 ENCOUNTER — Encounter (HOSPITAL_COMMUNITY): Payer: Self-pay | Admitting: Anesthesiology

## 2011-06-13 ENCOUNTER — Encounter (HOSPITAL_COMMUNITY)
Admission: RE | Disposition: A | Discharge status: Home / Self Care | Payer: Self-pay | Source: Ambulatory Visit | Attending: Orthopedic Surgery

## 2011-06-13 ENCOUNTER — Ambulatory Visit: Admit: 2011-06-13 | Payer: Self-pay | Admitting: Orthopedic Surgery

## 2011-06-13 DIAGNOSIS — M2240 Chondromalacia patellae, unspecified knee: Secondary | ICD-10-CM | POA: Insufficient documentation

## 2011-06-13 DIAGNOSIS — M23205 Derangement of unspecified medial meniscus due to old tear or injury, unspecified knee: Secondary | ICD-10-CM | POA: Diagnosis present

## 2011-06-13 DIAGNOSIS — M1710 Unilateral primary osteoarthritis, unspecified knee: Secondary | ICD-10-CM | POA: Insufficient documentation

## 2011-06-13 HISTORY — PX: KNEE ARTHROSCOPY: SHX127

## 2011-06-13 SURGERY — ARTHROSCOPY, KNEE
Anesthesia: General | Site: Knee | Laterality: Right

## 2011-06-13 SURGERY — ARTHROSCOPY, KNEE
Anesthesia: General | Site: Knee | Laterality: Right | Wound class: Clean

## 2011-06-13 MED ORDER — ONDANSETRON HCL 4 MG/2ML IJ SOLN
INTRAMUSCULAR | Status: AC
  Administered 2011-06-13: 4 mg
  Filled 2011-06-13: qty 2

## 2011-06-13 MED ORDER — OXYCODONE-ACETAMINOPHEN 10-650 MG PO TABS: 1.0000 | ORAL_TABLET | Freq: Four times a day (QID) | ORAL | Status: AC | PRN

## 2011-06-13 MED ORDER — ASPIRIN 325 MG PO TABS
325.0000 mg | ORAL_TABLET | Freq: Two times a day (BID) | ORAL | Status: DC
  Filled 2011-06-13 (×3): qty 1

## 2011-06-13 MED ORDER — FENTANYL CITRATE 0.05 MG/ML IJ SOLN
25.0000 ug | INTRAMUSCULAR | Status: DC | PRN
  Administered 2011-06-13: 25 ug via INTRAVENOUS
  Administered 2011-06-13: 50 ug via INTRAVENOUS
  Administered 2011-06-13: 25 ug via INTRAVENOUS
  Administered 2011-06-13: 50 ug via INTRAVENOUS

## 2011-06-13 MED ORDER — FENTANYL CITRATE 0.05 MG/ML IJ SOLN
INTRAMUSCULAR | Status: AC
  Filled 2011-06-13: qty 2

## 2011-06-13 MED ORDER — PROMETHAZINE HCL 25 MG/ML IJ SOLN: 6.2500 mg | INTRAMUSCULAR | Status: DC | PRN

## 2011-06-13 MED ORDER — BACITRACIN ZINC 500 UNIT/GM EX OINT
TOPICAL_OINTMENT | CUTANEOUS | Status: DC | PRN
  Administered 2011-06-13: 1 via TOPICAL

## 2011-06-13 MED ORDER — LACTATED RINGERS IR SOLN
Status: DC | PRN
  Administered 2011-06-13: 3000 mL
  Administered 2011-06-13: 6000 mL

## 2011-06-13 MED ORDER — LACTATED RINGERS IV SOLN
INTRAVENOUS | Status: DC | PRN
  Administered 2011-06-13 (×2): via INTRAVENOUS

## 2011-06-13 MED ORDER — PROPOFOL 10 MG/ML IV BOLUS
INTRAVENOUS | Status: DC | PRN
  Administered 2011-06-13: 180 mg via INTRAVENOUS

## 2011-06-13 MED ORDER — CEFAZOLIN SODIUM 1-5 GM-% IV SOLN
INTRAVENOUS | Status: AC
  Filled 2011-06-13: qty 100

## 2011-06-13 MED ORDER — ACETAMINOPHEN 10 MG/ML IV SOLN
INTRAVENOUS | Status: DC | PRN
  Administered 2011-06-13: 1000 mg via INTRAVENOUS

## 2011-06-13 MED ORDER — LIDOCAINE HCL (CARDIAC) 20 MG/ML IV SOLN
INTRAVENOUS | Status: DC | PRN
  Administered 2011-06-13: 100 mg via INTRAVENOUS

## 2011-06-13 MED ORDER — MIDAZOLAM HCL 5 MG/5ML IJ SOLN
INTRAMUSCULAR | Status: DC | PRN
  Administered 2011-06-13: 2 mg via INTRAVENOUS

## 2011-06-13 MED ORDER — LACTATED RINGERS IV SOLN: INTRAVENOUS | Status: DC

## 2011-06-13 MED ORDER — ACETAMINOPHEN 10 MG/ML IV SOLN
INTRAVENOUS | Status: AC
  Filled 2011-06-13: qty 100

## 2011-06-13 MED ORDER — BUPIVACAINE-EPINEPHRINE PF 0.25-1:200000 % IJ SOLN
INTRAMUSCULAR | Status: AC
  Filled 2011-06-13: qty 30

## 2011-06-13 MED ORDER — FENTANYL CITRATE 0.05 MG/ML IJ SOLN
INTRAMUSCULAR | Status: DC | PRN
  Administered 2011-06-13: 50 ug via INTRAVENOUS
  Administered 2011-06-13: 100 ug via INTRAVENOUS
  Administered 2011-06-13 (×2): 50 ug via INTRAVENOUS

## 2011-06-13 MED ORDER — BUPIVACAINE-EPINEPHRINE 0.25% -1:200000 IJ SOLN
INTRAMUSCULAR | Status: DC | PRN
  Administered 2011-06-13: 30 mL

## 2011-06-13 MED ORDER — CEFAZOLIN SODIUM-DEXTROSE 2-3 GM-% IV SOLR
2.0000 g | Freq: Once | INTRAVENOUS | Status: AC
  Administered 2011-06-13: 2 g via INTRAVENOUS

## 2011-06-13 MED ORDER — BACITRACIN ZINC 500 UNIT/GM EX OINT
TOPICAL_OINTMENT | CUTANEOUS | Status: AC
  Filled 2011-06-13: qty 15

## 2011-06-13 SURGICAL SUPPLY — 26 items
BANDAGE ELASTIC 4 VELCRO ST LF (GAUZE/BANDAGES/DRESSINGS) ×2 IMPLANT
BLADE GREAT WHITE 4.2 (BLADE) ×2 IMPLANT
BNDG COHESIVE 6X5 TAN STRL LF (GAUZE/BANDAGES/DRESSINGS) ×2 IMPLANT
CLOTH BEACON ORANGE TIMEOUT ST (SAFETY) ×2 IMPLANT
DRAPE LG THREE QUARTER DISP (DRAPES) ×2 IMPLANT
DRSG PAD ABDOMINAL 8X10 ST (GAUZE/BANDAGES/DRESSINGS) ×4 IMPLANT
DURAPREP 26ML APPLICATOR (WOUND CARE) ×2 IMPLANT
GLOVE BIOGEL PI IND STRL 8.5 (GLOVE) ×1 IMPLANT
GLOVE BIOGEL PI INDICATOR 8.5 (GLOVE) ×1
GLOVE ECLIPSE 8.0 STRL XLNG CF (GLOVE) ×4 IMPLANT
GOWN PREVENTION PLUS LG XLONG (DISPOSABLE) ×4 IMPLANT
GOWN PREVENTION PLUS XLARGE (GOWN DISPOSABLE) ×2 IMPLANT
GOWN STRL NON-REIN LRG LVL3 (GOWN DISPOSABLE) ×2 IMPLANT
GOWN STRL REIN XL XLG (GOWN DISPOSABLE) ×4 IMPLANT
MANIFOLD NEPTUNE II (INSTRUMENTS) ×2 IMPLANT
PACK ARTHROSCOPY WL (CUSTOM PROCEDURE TRAY) ×2 IMPLANT
PACK ICE MAXI GEL EZY WRAP (MISCELLANEOUS) ×2 IMPLANT
PAD MASON LEG HOLDER (PIN) ×2 IMPLANT
SET ARTHROSCOPY TUBING (MISCELLANEOUS) ×2
SET ARTHROSCOPY TUBING LN (MISCELLANEOUS) ×1 IMPLANT
SUT ETHILON 3 0 PS 1 (SUTURE) ×2 IMPLANT
TOWEL OR 17X26 10 PK STRL BLUE (TOWEL DISPOSABLE) ×6 IMPLANT
TUBING CONNECTING 10 (TUBING) ×2 IMPLANT
WAND 90 DEG TURBOVAC W/CORD (SURGICAL WAND) IMPLANT
WATER STERILE IRR 500ML POUR (IV SOLUTION) ×1 IMPLANT
WRAP KNEE MAXI GEL POST OP (GAUZE/BANDAGES/DRESSINGS) ×2 IMPLANT

## 2011-06-13 NOTE — Anesthesia Procedure Notes (Addendum)
Procedure Name: LMA Insertion Date/Time: 06/13/2011 1:22 PM Performed by: Tanith Dagostino, Ricki Rodriguez Pre-anesthesia Checklist: Patient identified, Emergency Drugs available, Suction available, Patient being monitored and Timeout performed Patient Re-evaluated:Patient Re-evaluated prior to inductionOxygen Delivery Method: Circle System Utilized Preoxygenation: Pre-oxygenation with 100% oxygen Intubation Type: IV induction Ventilation: Mask ventilation without difficulty LMA: LMA with gastric port inserted LMA Size: 4.0

## 2011-06-13 NOTE — Transfer of Care (Signed)
Immediate Anesthesia Transfer of Care Note  Patient: Edward [name]  Procedure(s) Performed:  ARTHROSCOPY KNEE - Right Knee Arthroscopy with Medial Menisectomy  Patient Location: PACU  Anesthesia Type: General  Level of Consciousness: awake, alert  and oriented  Airway & Oxygen Therapy: Patient connected to face mask oxygen  Post-op Assessment: Report given to PACU RN  Post vital signs: Reviewed and stable  Complications: No apparent anesthesia complications

## 2011-06-13 NOTE — Anesthesia Preprocedure Evaluation (Signed)
Anesthesia Evaluation  Patient identified by MRN, date of birth, ID band Patient awake    Reviewed: Allergy & Precautions, H&P , NPO status , Patient's Chart, lab work & pertinent test results  Airway Mallampati: II TM Distance: >3 FB Neck ROM: Full    Dental  (+) Teeth Intact and Dental Advisory Given   Pulmonary neg pulmonary ROS,  clear to auscultation  Pulmonary exam normal       Cardiovascular hypertension, Pt. on medications + CAD - DOE Regular Normal    Neuro/Psych PSYCHIATRIC DISORDERS Negative Neurological ROS  Negative Psych ROS   GI/Hepatic negative GI ROS, Neg liver ROS, GERD-  Medicated and Controlled,  Endo/Other  Negative Endocrine ROS  Renal/GU negative Renal ROS  Genitourinary negative   Musculoskeletal negative musculoskeletal ROS (+)   Abdominal Normal abdominal exam  (+)   Peds negative pediatric ROS (+)  Hematology negative hematology ROS (+)   Anesthesia Other Findings   Reproductive/Obstetrics negative OB ROS                           Anesthesia Physical Anesthesia Plan  ASA: III  Anesthesia Plan: General   Post-op Pain Management:    Induction: Intravenous  Airway Management Planned: LMA  Additional Equipment:   Intra-op Plan:   Post-operative Plan:   Informed Consent: I have reviewed the patients History and Physical, chart, labs and discussed the procedure including the risks, benefits and alternatives for the proposed anesthesia with the patient or authorized representative who has indicated his/her understanding and acceptance.   Dental advisory given  Plan Discussed with: CRNA  Anesthesia Plan Comments:         Anesthesia Quick Evaluation

## 2011-06-13 NOTE — Brief Op Note (Signed)
06/13/2011  2:09 PM  PATIENT:  Edward [name] Edward [name]  60 y.o. male  PRE-OPERATIVE DIAGNOSIS:  Right Knee Medial Meniscus Tear  POST-OPERATIVE DIAGNOSIS:  Right Knee Medial Meniscus Tear  PROCEDURE:  Procedure(s): ARTHROSCOPY KNEE  SURGEON:  Surgeon(s): Kandice Schmelter A Miral Hoopes  PHYSICIAN ASSISTANT:   ASSISTANTS: Nurse   ANESTHESIA:   local and general  EBL:  Total I/O In: 1000 [I.V.:1000] Out: -   BLOOD ADMINISTERED:none  DRAINS: none   LOCAL MEDICATIONS USED:  MARCAINE 30CC of 0.25%  SPECIMEN:  No Specimen  DISPOSITION OF SPECIMEN:  N/A  COUNTS:  YES  TOURNIQUET:  * No tourniquets in log *  DICTATION: .Other Dictation: Dictation Number [id number]  PLAN OF CARE: Discharge to home after PACU  PATIENT DISPOSITION:  PACU - hemodynamically stable.   Delay start of Pharmacological VTE agent (>24hrs) due to surgical blood loss or risk of bleeding:  {YES/NO/NOT APPLICABLE:20182

## 2011-06-13 NOTE — Interval H&P Note (Signed)
History and Physical Interval Note:  06/13/2011 12:39 PM  Edward [name]  has presented today for surgery, with the diagnosis of Right Knee Medial Meniscus Tear  The various methods of treatment have been discussed with the patient and family. After consideration of risks, benefits and other options for treatment, the patient has consented to  Procedure(s): ARTHROSCOPY KNEE as a surgical intervention .  The patients' history has been reviewed, patient examined, no change in status, stable for surgery.  I have reviewed the patients' chart and labs.  Questions were answered to the patient's satisfaction.     [name]

## 2011-06-13 NOTE — Anesthesia Postprocedure Evaluation (Signed)
Anesthesia Post Note  Patient: Edward [name]  Procedure(s) Performed:  ARTHROSCOPY KNEE - Right Knee Arthroscopy with Medial Menisectomy  Anesthesia type: General  Patient location: PACU  Post pain: Pain level controlled  Post assessment: Post-op Vital signs reviewed  Last Vitals:  Filed Vitals:   06/13/11 1442  BP: 131/66  Pulse:   Temp:   Resp: 16    Post vital signs: Reviewed  Level of consciousness: sedated  Complications: No apparent anesthesia complications

## 2011-06-13 NOTE — Op Note (Signed)
NAMECRISTIAN, DAVITT                  ACCOUNT NO.:  [id number]  MEDICAL RECORD NO.:  [id number]  LOCATION:  [facility]                         FACILITY:  [facility]  PHYSICIAN:  Georges Lynch. Koichi Platte, M.D.DATE OF BIRTH:  06/16/1951  DATE OF PROCEDURE:  06/13/2011 DATE OF DISCHARGE:                              OPERATIVE REPORT   SURGEON:  Georges Lynch. Darrelyn Hillock, M.D.  ASSISTANT:  Nurse.  PREOPERATIVE DIAGNOSES: 1. Severe degenerative arthritis, right knee. 2. Bucket-handle tear of the medial meniscus, right knee.  POSTOPERATIVE DIAGNOSES: 1. Severe degenerative arthritis, right knee. 2. Bucket-handle tear of the medial meniscus, right knee.  OPERATION: 1. Diagnostic arthroscopy, right knee. 2. Medial meniscectomy right knee. 3. Synovectomy, right knee. 4. Abrasion chondroplasty, medial femoral condyle, right knee. 5. Abrasion chondroplasty, patella, right knee. 6. Microfracture technique of the femoral condyle right knee.  PROCEDURE:  Under general anesthesia, routine orthopedic prep and drape in the right lower extremity was carried out.  The patient's right lower extremity was placed in the knee holder.  The appropriate time-out was carried out before any incisions were made.  Also at this time, his appropriate the right leg was marked in the holding area.  The surgeon as I mentioned, was Dr. Darrelyn Hillock, assistant was the nurse.  The procedure under general anesthesia, routine orthopedic prep and drape was carried out.  A small punctate incision made in suprapatellar pouch, inflow cannula was inserted.  Knee was distended with saline.  Another small punctate incision was made in the anterolateral joint.  The arthroscope was entered from lateral approach and a complete diagnostic arthroscopy was carried out.  I went through the medial portal and at this time, went up into the suprapatellar pouch, did a synovectomy and also did an abrasion chondroplasty, patella.  He had rather severe  chondromalacia of the patella.  I went down in the lateral joint.  The lateral joint showed some very mild arthritic changes.  The medial joint showed rather significant arthritic changes.  He had a large bucket-handle tear of the medial meniscus.  I introduced a shaver suction device followed by the ArthroCare and did a medial meniscectomy.  I also did an abrasion chondroplasty, medial femoral condyle followed by microfracture technique of the medial femoral condyle.  Cruciates were intact.  I thoroughly irrigated out the knee, removed all the fluid, closed all 3 punctate incisions with 3-0 nylon suture.  I injected 30 mL of 0.25% Marcaine epinephrine in the joint.  He also had 1 g of IV Ancef preop. Postop, the patient will be maintained on aspirin protocol 325 mg b.i.d., and anticoagulant. Also on Percocet 10/650 one every 4 hours p.r.n. for pain.  He will be seen in the office in 10 to 12 days or prior to if there is a problem. He will be on crutches, partial to full weightbearing as tolerated.          ______________________________ Georges Lynch. Darrelyn Hillock, M.D.     RAG/MEDQ  D:  06/13/2011  T:  06/13/2011  Job:  [id number]

## 2011-06-13 NOTE — Anesthesia Postprocedure Evaluation (Signed)
Anesthesia Post Note  Patient: Edward [name]  Procedure(s) Performed:  ARTHROSCOPY KNEE - Right Knee Arthroscopy with Medial Menisectomy  Anesthesia type: General  Patient location: PACU  Post pain: Pain level controlled  Post assessment: Post-op Vital signs reviewed  Last Vitals:  Filed Vitals:   06/13/11 1037  BP: 110/72  Pulse: 58  Temp: 36.9 C  Resp: 18    Post vital signs: Reviewed  Level of consciousness: sedated  Complications: No apparent anesthesia complications

## 2011-06-15 ENCOUNTER — Encounter (HOSPITAL_COMMUNITY): Payer: Self-pay | Admitting: Orthopedic Surgery

## 2011-09-04 ENCOUNTER — Encounter (HOSPITAL_COMMUNITY): Payer: Self-pay

## 2011-10-25 ENCOUNTER — Other Ambulatory Visit: Payer: Self-pay

## 2013-03-24 ENCOUNTER — Other Ambulatory Visit (HOSPITAL_COMMUNITY): Payer: Self-pay | Admitting: Orthopaedic Surgery

## 2013-03-24 ENCOUNTER — Ambulatory Visit (HOSPITAL_COMMUNITY)
Admission: RE | Admit: 2013-03-24 | Discharge: 2013-03-24 | Disposition: A | Discharge status: Home / Self Care | Payer: Medicare Other | Source: Ambulatory Visit | Attending: Orthopaedic Surgery | Admitting: Orthopaedic Surgery

## 2013-03-24 DIAGNOSIS — M25512 Pain in left shoulder: Secondary | ICD-10-CM

## 2013-03-24 DIAGNOSIS — Z1389 Encounter for screening for other disorder: Secondary | ICD-10-CM | POA: Insufficient documentation

## 2013-04-17 ENCOUNTER — Encounter: Payer: Self-pay | Admitting: Interventional Cardiology

## 2013-04-18 ENCOUNTER — Ambulatory Visit (INDEPENDENT_AMBULATORY_CARE_PROVIDER_SITE_OTHER): Payer: Medicare Other | Admitting: Interventional Cardiology

## 2013-04-18 ENCOUNTER — Encounter: Payer: Self-pay | Admitting: Interventional Cardiology

## 2013-04-18 VITALS — BP 119/74 | HR 60 | Ht 70.0 in | Wt 194.0 lb

## 2013-04-18 DIAGNOSIS — I25119 Atherosclerotic heart disease of native coronary artery with unspecified angina pectoris: Secondary | ICD-10-CM | POA: Insufficient documentation

## 2013-04-18 DIAGNOSIS — I10 Essential (primary) hypertension: Secondary | ICD-10-CM | POA: Insufficient documentation

## 2013-04-18 DIAGNOSIS — I2510 Atherosclerotic heart disease of native coronary artery without angina pectoris: Secondary | ICD-10-CM

## 2013-04-18 MED ORDER — NITROGLYCERIN 0.4 MG SL SUBL: 0.4000 mg | SUBLINGUAL_TABLET | SUBLINGUAL | Status: DC | PRN

## 2013-04-18 NOTE — Patient Instructions (Addendum)
Your physician recommends that you continue on your current medications as directed. Please refer to the Current Medication list given to you today.  Your physician recommends that you schedule a follow-up appointment in: 6-9 months

## 2013-04-18 NOTE — Progress Notes (Signed)
Patient ID: Edward [name], male   DOB: 1951/03/19, 62 y.o.   MRN: [id number]    HPI Edward [name] is having occasional angina. It is nitroglycerin responsive. He is relatively sedentary. He has no physical limitations because of CAD. He denies orthopnea, PND, palpitations, claudication, and other CV symptoms. His last exercise treadmill test was in April 2014 on which he did quite well.  No Known Allergies  Current Outpatient Prescriptions  Medication Sig Dispense Refill  . Ascorbic Acid (VITAMIN C) 1000 MG tablet Take 1,000 mg by mouth daily.        Marland Kitchen aspirin 325 MG tablet Take 325 mg by mouth daily.        . calcium carbonate (OS-CAL) 600 MG TABS Take 600 mg by mouth daily.        . Coenzyme Q10 (COQ10) 100 MG CAPS Take 1 capsule by mouth daily.        . Flaxseed, Linseed, (FLAXSEED OIL) 1000 MG CAPS Take 1 capsule by mouth daily.        Marland Kitchen HYDROcodone-acetaminophen (NORCO) 10-325 MG per tablet Take 1 tablet by mouth every 6 (six) hours as needed for pain.      Marland Kitchen losartan (COZAAR) 100 MG tablet Take 50 mg by mouth every evening.       . mometasone (NASONEX) 50 MCG/ACT nasal spray Place 2 sprays into the nose daily as needed. For sinus congestion       . Multiple Vitamins-Minerals (MULTIVITAMINS THER. W/MINERALS) TABS Take 1 tablet by mouth daily.        . nitroGLYCERIN (NITROSTAT) 0.4 MG SL tablet Place 0.4 mg under the tongue every 5 (five) minutes as needed for chest pain.      . Omega-3 Fatty Acids (FISH OIL) 1000 MG CAPS Take 1 capsule by mouth daily.        . pantoprazole (PROTONIX) 40 MG tablet Take 40 mg by mouth daily.       . ranolazine (RANEXA) 1000 MG SR tablet Take 500 mg by mouth every morning.       . rosuvastatin (CRESTOR) 20 MG tablet Take 20 mg by mouth every evening.       . vitamin E 400 UNIT capsule Take 400 Units by mouth daily.        Marland Kitchen zolpidem (AMBIEN) 10 MG tablet Take 10 mg by mouth at bedtime.         No current facility-administered medications for this  visit.    Past Medical History  Diagnosis Date  . Hypertension   . History of meniscal tear   . Arthritis   . GERD (gastroesophageal reflux disease)   . Anxiety   . Depression   . Cancer     skin cancer  . CAD (coronary artery disease)     with high grade obstruction second obtuse marginal and diffuse LAD and circumflex disease by cath 2010  . Erectile dysfunction   . Elevated cholesterol     LDL Goal < 70  . History of nuclear stress test 5/10    no ischemia  . Colon polyp     Past Surgical History  Procedure Laterality Date  . Shoulder surgery      x3 left / x1 right  . Back surgery    . Cervical fusion    . Knee arthroscopy  06/13/2011    Procedure: ARTHROSCOPY KNEE;  Surgeon: Jacki Cones;  Location: [facility];  Service: Orthopedics;  Laterality: Right;  Right Knee  Arthroscopy with Medial Menisectomy  . Cardiac catheterization      2/10, normal LV function    ROS: Denies transient neurological symptoms. Limited by bilateral knee arthritis.  PHYSICAL EXAM BP 119/74  Pulse 60  Ht 5\' 10"  (1.778 m)  Wt 194 lb (87.998 kg)  BMI 27.84 kg/m2 Chest is clear No murmur, rub, click, or gallop is heard on cardiac auscultation Extremities reveal no edema. Posterior tibial pulses are 2+. No carotid bruits are heard.  EKG: Normal sinus rhythm with normal tracing.  ASSESSMENT AND PLAN  1. Coronary disease with stable angina pectoris. The coronary disease was nonobstructive at the time of last cath in 2011. In April exercise treadmill test was nonischemic.  2. Blood pressure is under good control.  3. Hyperlipidemia, on therapy, and followed by primary care.  Overall, Edward [name] is doing well. I encouraged physical activity. We discussed nitroglycerin usage. We'll plan to see him back in 6 months. We'll likely do a functional test at some point in 2015.

## 2013-05-22 ENCOUNTER — Other Ambulatory Visit: Payer: Self-pay | Admitting: Neurosurgery

## 2013-05-26 ENCOUNTER — Other Ambulatory Visit (HOSPITAL_COMMUNITY): Payer: Self-pay | Admitting: *Deleted

## 2013-05-26 ENCOUNTER — Encounter (HOSPITAL_COMMUNITY): Payer: Self-pay | Admitting: Pharmacy Technician

## 2013-05-26 NOTE — H&P (Signed)
> [address] [place], [place] [id number] Phone: [phone]   Patient ID:   [id number] Patient: Edward [name]  Date of Birth: 05-Mar-1951 Visit Type: Office Visit   Date: 05/21/2013 01:45 PM Provider: Danae Orleans. Venetia Maxon    This 62 year old male presents for neck pain.  HISTORY OF PRESENT ILLNESS: 1.  neck pain   Mr. Cowdrey returns to discuss his MRI results.  He reports only worsening pain since his October visit.  MRI scanned into [facility].  Patient is examining room coaching his left arm and complaining of severe pain.  At this point he is having less pain into his hand and more into his biceps.  Patient's examination reveals significant left biceps weakness.  He also has rotator cuff pathology.  He has been told that he needs to have surgery on his left shoulder but is very concerned about his neck and left arm pain.  He continues to have a positive Spurling maneuver to the left and has weakness in his left biceps at 4/5.   Medical/Surgical/Interim History  Reviewed, no change.  Last detailed document date:04/14/2013.    PAST MEDICAL HISTORY, SURGICAL HISTORY, FAMILY HISTORY, SOCIAL HISTORY AND REVIEW OF SYSTEMS I have reviewed the patient's past medical, surgical, family and social history as well as the comprehensive review of systems as included on the [facility] history form dated, which I have signed.  Family History: Reviewed, no changes.  Last detailed document date:04/14/2013.   Social History: Reviewed, no changes. Last detailed document date: 04/14/2013.      MEDICATIONS(added, continued or stopped this visit):   Medication Dose Prescribed Else Ind Started Stopped  aspirin 81 mg chewable tablet 81 mg Y    B12 5,000 mcg-100 mcg sublingual lozenge 5,000 mcg-100 mcg Y    Crestor 20 mg tablet 20 mg Y    cyanocobalamin 2 mg-levomefolate cal 1.13 mg-pyridoxine 25 mg tablet 2 mg-1.13 mg-25 mg Y    Fish Oil 100 mg-160  mg-1,000 mg capsule 100 mg-160 mg-1,000 mg Y    hydrocodone 5 mg-acetaminophen 325 mg tablet 5 mg-325 mg Y    losartan 50 mg tablet 50 mg Y    multivitamin tablet  Y    Protonix 40 mg tablet,delayed release 40 mg Y    Xanax 0.5 mg tablet 0.5 mg Y       ALLERGIES:  Ingredient Reaction Medication Name Comment  NO KNOWN ALLERGIES     No known allergies.   Vitals Date Temp F BP Pulse Ht In Wt Lb BMI BSA Pain Score  05/21/2013    70 210 30.13        DIAGNOSTIC RESULTS MRI of the cervical spine was reviewed which demonstrates a new disc herniation at C5-C6 on the left.  I reviewed plain radiographs which appeared to show solid arthrodesis at the C6-C7 level although there is a linear lucency through the bone graft which raises the possibility of nonunion.    IMPRESSION Patient has significant left C6 radiculopathy.  He has a herniated disc at C5-C6 on the left.  Assessment/Plan # Detail Type Description   1. Assessment Cervical disc disorder w/ radiculopathy (723.4).       2. Assessment Neck pain (723.1).       3. Assessment Cervical disk displacement w/o myelopathy (722.0).       4. Assessment Cervical disc degeneration (722.4).       5. Assessment Rotator cuff arthropathy (716.91).  I recommended the patient undergo surgery.  This will consist of exploration of prior fusion at C6-C7 with anterior cervical decompression and fusion at C5-C6 level.  He says he is in miserable pain in the seated relief and is quite uncomfortable.  Risks and benefits were discussed in detail with the patient he wishes to proceed.  Orders: Diagnostic Procedures: Assessment Procedure  723.4 ACDF - C5-C6 with exploration of fusion c6/7 [facility]             Provider:  Danae Orleans. Venetia Maxon  05/23/2013 03:30 PM Dictation edited by: Danae Orleans. Venetia Maxon    CC Providers: Sheran Luz [address] [place], [place]  [address]- ----------------------------------------------------------------------------------------------------------------------------------------------------------------------         Electronically signed by Danae Orleans Venetia Maxon on 05/23/2013 03:31 PM  > [address] [place] [address] [place], [place] [id number] Phone: [phone]   Patient ID:   [id number] Patient: Edward [name]  Date of Birth: 08/12/1950 Visit Type: Office Visit   Date: 04/14/2013 11:45 AM Provider: Danae Orleans. Venetia Maxon   Historian: self  This 62 year old male presents for neck pain.  HISTORY OF PRESENT ILLNESS: 1.  neck pain   Patient is currently complaining of left hand pain and weakness.  This appears to involve his hand intrinsics and finger extensors.  He has full strength on the right hand for strength in his left arm with the exception of he intrinsics and finger extensors at 4/5.  He does not have a positive Tinel's sign at the left elbow.  He has been doing well from the standpoint of his low back.     PAST MEDICAL/SURGICAL HISTORY  (Detailed)  Disease/disorder Onset Date Management Date Comments    Discectomy, lumbar      Spinal fusion, cervical    Hyperlipidemia          Family History  (Detailed) Patient reports there is no relevant family history.   SOCIAL HISTORY  (Detailed) Tobacco use reviewed. Preferred language is Unknown.   Smoking status: Never smoker.  SMOKING STATUS Use Status Type Smoking Status Usage Per Day Years Used Total Pack Years  no/never  Never smoker             Medications (added, continued or stopped this visit):    Allergies:  Ingredient Reaction Medication Name Comment  NO KNOWN ALLERGIES     No known allergies.   Vitals Date Temp F BP Pulse Ht In Wt Lb BMI BSA Pain Score  04/14/2013  116/73 63 70 210 30.13  6/10        IMPRESSION Impression is of a left C8 radiculopathy with hand intrinsic weakness.  I have recommended that the patient  undergo an MRI of his cervical spine.  I will see him back after this has been done.  Completed Orders (this encounter) Order Details Reason Side Interpretation Result Initial Treatment Date Region  Cervical Spine- AP/Lat/Flex/Ex      04/14/2013    Assessment/Plan # Detail Type Description   1. Assessment Cervical disc disorder w/ radiculopathy (723.4).       2. Assessment Neck pain (723.1).        Cervical MRI as soon as possible with followup to me.  Orders: Diagnostic Procedures: Assessment Procedure   Cervical Spine- AP/Lat/Flex/Ex  723.4 MRI Spinal/cerv W/o Contrast             Provider:  Danae Orleans. Venetia Maxon  04/18/2013 03:40 PM Dictation edited by: Danae Orleans. Venetia Maxon    CC  Providers: Sheran Luz [address] [place], [place] [address]- ----------------------------------------------------------------------------------------------------------------------------------------------------------------------         Electronically signed by Danae Orleans. Venetia Maxon on 04/18/2013 03:40 PM

## 2013-05-26 NOTE — Pre-Procedure Instructions (Signed)
Edward [name]  05/26/2013   Your procedure is scheduled on:  Thursday, May 29, 2013 at 11:45 AM.   Report to [facility] Entrance "A" at 9:45 AM.   Call this number if you have problems the morning of surgery: [phone]   Remember:   Do not eat food or drink liquids after midnight Wednesday, 05/28/13.   Take these medicines the morning of surgery with A SIP OF WATER: pantoprazole (PROTONIX), ranolazine (RANEXA), HYDROcodone-acetaminophen (NORCO/VICODIN) - if needed, ALPRAZolam Prudy Feeler) - if needed.  Stop all Vitamins, Herbal Medications, Fish Oil, and Aspirin as of today, 05/27/13.                Do not wear jewelry.  Do not wear lotions, powders, or cologne. You may wear deodorant.             Men may shave face and neck.  Do not bring valuables to the hospital.  [facility] is not responsible                  for any belongings or valuables.               Contacts, dentures or bridgework may not be worn into surgery.  Leave suitcase in the car. After surgery it may be brought to your room.  For patients admitted to the hospital, discharge time is determined by your                treatment team.                 Special Instructions: Shower using CHG 2 nights before surgery and the night before surgery.  If you shower the day of surgery use CHG.  Use special wash - you have one bottle of CHG for all showers.  You should use approximately 1/3 of the bottle for each shower.   Please read over the following fact sheets that you were given: Pain Booklet, Coughing and Deep Breathing, MRSA Information and Surgical Site Infection Prevention

## 2013-05-27 ENCOUNTER — Ambulatory Visit (HOSPITAL_COMMUNITY)
Admission: RE | Admit: 2013-05-27 | Discharge: 2013-05-27 | Disposition: A | Discharge status: Home / Self Care | Payer: Medicare Other | Source: Ambulatory Visit | Attending: Neurosurgery | Admitting: Neurosurgery

## 2013-05-27 ENCOUNTER — Encounter (HOSPITAL_COMMUNITY): Payer: Self-pay

## 2013-05-27 ENCOUNTER — Encounter (HOSPITAL_COMMUNITY)
Admission: RE | Admit: 2013-05-27 | Discharge: 2013-05-27 | Disposition: A | Discharge status: Home / Self Care | Payer: Medicare Other | Source: Ambulatory Visit | Attending: Neurosurgery | Admitting: Neurosurgery

## 2013-05-27 DIAGNOSIS — Z01818 Encounter for other preprocedural examination: Secondary | ICD-10-CM | POA: Insufficient documentation

## 2013-05-27 HISTORY — DX: Angina pectoris, unspecified: I20.9

## 2013-05-27 LAB — CBC
HCT: 42.8 % (ref 39.0–52.0)
Hemoglobin: 15.2 g/dL (ref 13.0–17.0)
Platelets: 174 10*3/uL (ref 150–400)
RBC: 5.02 MIL/uL (ref 4.22–5.81)
WBC: 7.6 10*3/uL (ref 4.0–10.5)

## 2013-05-27 LAB — BASIC METABOLIC PANEL
Calcium: 9.4 mg/dL (ref 8.4–10.5)
GFR calc Af Amer: >90 mL/min (ref >90)
GFR calc non Af Amer: >90 mL/min (ref >90)
Glucose, Bld: 96 mg/dL (ref 70–99)
Sodium: 137 mEq/L (ref 135–145)

## 2013-05-27 LAB — SURGICAL PCR SCREEN
MRSA, PCR: NEGATIVE
Staphylococcus aureus: NEGATIVE

## 2013-05-28 MED ORDER — CEFAZOLIN SODIUM-DEXTROSE 2-3 GM-% IV SOLR: 2.0000 g | INTRAVENOUS | Status: DC

## 2013-05-29 ENCOUNTER — Encounter (HOSPITAL_COMMUNITY)
Admission: RE | Disposition: A | Discharge status: Home / Self Care | Payer: Self-pay | Source: Ambulatory Visit | Attending: Neurosurgery

## 2013-05-29 ENCOUNTER — Inpatient Hospital Stay (HOSPITAL_COMMUNITY): Payer: Medicare Other

## 2013-05-29 ENCOUNTER — Inpatient Hospital Stay (HOSPITAL_COMMUNITY): Payer: Medicare Other | Admitting: Anesthesiology

## 2013-05-29 ENCOUNTER — Encounter (HOSPITAL_COMMUNITY): Payer: Self-pay

## 2013-05-29 ENCOUNTER — Inpatient Hospital Stay (HOSPITAL_COMMUNITY)
Admission: RE | Admit: 2013-05-29 | Discharge: 2013-05-30 | DRG: 473 | Disposition: A | Discharge status: Home / Self Care | Payer: Medicare Other | Source: Ambulatory Visit | Attending: Neurosurgery | Admitting: Neurosurgery

## 2013-05-29 ENCOUNTER — Encounter (HOSPITAL_COMMUNITY): Payer: Medicare Other | Admitting: Anesthesiology

## 2013-05-29 DIAGNOSIS — M5030 Other cervical disc degeneration, unspecified cervical region: Secondary | ICD-10-CM | POA: Diagnosis present

## 2013-05-29 DIAGNOSIS — F341 Dysthymic disorder: Secondary | ICD-10-CM | POA: Diagnosis present

## 2013-05-29 DIAGNOSIS — Z472 Encounter for removal of internal fixation device: Secondary | ICD-10-CM

## 2013-05-29 DIAGNOSIS — M5020 Other cervical disc displacement, unspecified cervical region: Principal | ICD-10-CM | POA: Diagnosis present

## 2013-05-29 DIAGNOSIS — Z79899 Other long term (current) drug therapy: Secondary | ICD-10-CM

## 2013-05-29 DIAGNOSIS — I2510 Atherosclerotic heart disease of native coronary artery without angina pectoris: Secondary | ICD-10-CM | POA: Diagnosis present

## 2013-05-29 DIAGNOSIS — K219 Gastro-esophageal reflux disease without esophagitis: Secondary | ICD-10-CM | POA: Diagnosis present

## 2013-05-29 DIAGNOSIS — I10 Essential (primary) hypertension: Secondary | ICD-10-CM | POA: Diagnosis present

## 2013-05-29 DIAGNOSIS — Z7982 Long term (current) use of aspirin: Secondary | ICD-10-CM

## 2013-05-29 DIAGNOSIS — Z981 Arthrodesis status: Secondary | ICD-10-CM

## 2013-05-29 DIAGNOSIS — Z87891 Personal history of nicotine dependence: Secondary | ICD-10-CM

## 2013-05-29 HISTORY — PX: ANTERIOR CERVICAL DECOMP/DISCECTOMY FUSION: SHX1161

## 2013-05-29 HISTORY — PX: NECK SURGERY: SHX720

## 2013-05-29 SURGERY — ANTERIOR CERVICAL DECOMPRESSION/DISCECTOMY FUSION 1 LEVEL
Anesthesia: General | Site: Neck | Wound class: Clean

## 2013-05-29 MED ORDER — PANTOPRAZOLE SODIUM 40 MG PO TBEC: 40.0000 mg | DELAYED_RELEASE_TABLET | Freq: Every day | ORAL | Status: DC

## 2013-05-29 MED ORDER — MIDAZOLAM HCL 5 MG/5ML IJ SOLN
INTRAMUSCULAR | Status: DC | PRN
  Administered 2013-05-29: 2 mg via INTRAVENOUS

## 2013-05-29 MED ORDER — FLEET ENEMA 7-19 GM/118ML RE ENEM
1.0000 | ENEMA | Freq: Once | RECTAL | Status: AC | PRN
  Filled 2013-05-29: qty 1

## 2013-05-29 MED ORDER — ARTIFICIAL TEARS OP OINT
TOPICAL_OINTMENT | OPHTHALMIC | Status: DC | PRN
  Administered 2013-05-29: 1 via OPHTHALMIC

## 2013-05-29 MED ORDER — FENTANYL CITRATE 0.05 MG/ML IJ SOLN: 50.0000 ug | Freq: Once | INTRAMUSCULAR | Status: DC

## 2013-05-29 MED ORDER — HEMOSTATIC AGENTS (NO CHARGE) OPTIME
TOPICAL | Status: DC | PRN
  Administered 2013-05-29: 1 via TOPICAL

## 2013-05-29 MED ORDER — ALUM & MAG HYDROXIDE-SIMETH 200-200-20 MG/5ML PO SUSP: 30.0000 mL | Freq: Four times a day (QID) | ORAL | Status: DC | PRN

## 2013-05-29 MED ORDER — SENNA 8.6 MG PO TABS
1.0000 | ORAL_TABLET | Freq: Two times a day (BID) | ORAL | Status: DC
  Filled 2013-05-29 (×2): qty 1

## 2013-05-29 MED ORDER — VITAMIN E 180 MG (400 UNIT) PO CAPS
400.0000 [IU] | ORAL_CAPSULE | Freq: Every day | ORAL | Status: DC
  Filled 2013-05-29 (×2): qty 1

## 2013-05-29 MED ORDER — CEFAZOLIN SODIUM-DEXTROSE 2-3 GM-% IV SOLR
INTRAVENOUS | Status: AC
  Administered 2013-05-29: 2 g via INTRAVENOUS
  Filled 2013-05-29: qty 50

## 2013-05-29 MED ORDER — THROMBIN 5000 UNITS EX SOLR
CUTANEOUS | Status: DC | PRN
  Administered 2013-05-29 (×2): 5000 [IU] via TOPICAL

## 2013-05-29 MED ORDER — COQ10 100 MG PO CAPS: 1.0000 | ORAL_CAPSULE | Freq: Every day | ORAL | Status: DC

## 2013-05-29 MED ORDER — VITAMIN B-12 1000 MCG PO TABS
1000.0000 ug | ORAL_TABLET | Freq: Every day | ORAL | Status: DC
  Filled 2013-05-29 (×2): qty 1

## 2013-05-29 MED ORDER — HYDROCODONE-ACETAMINOPHEN 5-325 MG PO TABS
1.0000 | ORAL_TABLET | ORAL | Status: DC | PRN
  Administered 2013-05-29: 1 via ORAL
  Administered 2013-05-29: 2 via ORAL
  Filled 2013-05-29: qty 1
  Filled 2013-05-29: qty 2

## 2013-05-29 MED ORDER — OXYCODONE HCL 5 MG PO TABS
5.0000 mg | ORAL_TABLET | Freq: Once | ORAL | Status: AC | PRN
  Administered 2013-05-29: 5 mg via ORAL

## 2013-05-29 MED ORDER — MAGNESIUM OXIDE 400 MG PO TABS
400.0000 mg | ORAL_TABLET | Freq: Every day | ORAL | Status: DC
  Filled 2013-05-29 (×2): qty 1

## 2013-05-29 MED ORDER — DOCUSATE SODIUM 100 MG PO CAPS
100.0000 mg | ORAL_CAPSULE | Freq: Two times a day (BID) | ORAL | Status: DC
  Administered 2013-05-29: 100 mg via ORAL
  Filled 2013-05-29 (×3): qty 1

## 2013-05-29 MED ORDER — ONDANSETRON HCL 4 MG/2ML IJ SOLN
4.0000 mg | INTRAMUSCULAR | Status: DC | PRN
  Administered 2013-05-30: 4 mg via INTRAVENOUS
  Filled 2013-05-29: qty 2

## 2013-05-29 MED ORDER — ONDANSETRON HCL 4 MG/2ML IJ SOLN
INTRAMUSCULAR | Status: DC | PRN
  Administered 2013-05-29: 4 mg via INTRAVENOUS

## 2013-05-29 MED ORDER — SODIUM CHLORIDE 0.9 % IV SOLN: 250.0000 mL | INTRAVENOUS | Status: DC

## 2013-05-29 MED ORDER — HYDROMORPHONE HCL PF 1 MG/ML IJ SOLN
INTRAMUSCULAR | Status: AC
  Filled 2013-05-29: qty 1

## 2013-05-29 MED ORDER — METHOCARBAMOL 500 MG PO TABS
500.0000 mg | ORAL_TABLET | Freq: Four times a day (QID) | ORAL | Status: DC | PRN
  Administered 2013-05-29: 500 mg via ORAL
  Filled 2013-05-29: qty 1

## 2013-05-29 MED ORDER — MIDAZOLAM HCL 2 MG/2ML IJ SOLN: 1.0000 mg | INTRAMUSCULAR | Status: DC | PRN

## 2013-05-29 MED ORDER — SUFENTANIL CITRATE 50 MCG/ML IV SOLN
INTRAVENOUS | Status: DC | PRN
  Administered 2013-05-29: 15 ug via INTRAVENOUS
  Administered 2013-05-29: 5 ug via INTRAVENOUS

## 2013-05-29 MED ORDER — NEOSTIGMINE METHYLSULFATE 1 MG/ML IJ SOLN
INTRAMUSCULAR | Status: DC | PRN
  Administered 2013-05-29: 5 mg via INTRAVENOUS

## 2013-05-29 MED ORDER — RANOLAZINE ER 500 MG PO TB12: 250.0000 mg | ORAL_TABLET | Freq: Every day | ORAL | Status: DC

## 2013-05-29 MED ORDER — LIDOCAINE HCL (CARDIAC) 20 MG/ML IV SOLN
INTRAVENOUS | Status: DC | PRN
  Administered 2013-05-29: 100 mg via INTRAVENOUS

## 2013-05-29 MED ORDER — PROMETHAZINE HCL 25 MG/ML IJ SOLN: 6.2500 mg | INTRAMUSCULAR | Status: DC | PRN

## 2013-05-29 MED ORDER — KCL IN DEXTROSE-NACL 20-5-0.45 MEQ/L-%-% IV SOLN
INTRAVENOUS | Status: DC
  Filled 2013-05-29 (×3): qty 1000

## 2013-05-29 MED ORDER — SENNOSIDES-DOCUSATE SODIUM 8.6-50 MG PO TABS
1.0000 | ORAL_TABLET | Freq: Every evening | ORAL | Status: DC | PRN
  Administered 2013-05-29: 1 via ORAL
  Filled 2013-05-29: qty 1

## 2013-05-29 MED ORDER — CALCIUM CARBONATE 1250 (500 CA) MG PO TABS
1.0000 | ORAL_TABLET | Freq: Every day | ORAL | Status: DC
  Filled 2013-05-29 (×2): qty 1

## 2013-05-29 MED ORDER — LACTATED RINGERS IV SOLN
INTRAVENOUS | Status: DC
  Administered 2013-05-29: 10:00:00 via INTRAVENOUS

## 2013-05-29 MED ORDER — ZOLPIDEM TARTRATE 5 MG PO TABS: 5.0000 mg | ORAL_TABLET | Freq: Every evening | ORAL | Status: DC | PRN

## 2013-05-29 MED ORDER — BISACODYL 10 MG RE SUPP: 10.0000 mg | Freq: Every day | RECTAL | Status: DC | PRN

## 2013-05-29 MED ORDER — SODIUM CHLORIDE 0.9 % IJ SOLN: 3.0000 mL | INTRAMUSCULAR | Status: DC | PRN

## 2013-05-29 MED ORDER — ACETAMINOPHEN 325 MG PO TABS: 650.0000 mg | ORAL_TABLET | ORAL | Status: DC | PRN

## 2013-05-29 MED ORDER — PHENOL 1.4 % MT LIQD: 1.0000 | OROMUCOSAL | Status: DC | PRN

## 2013-05-29 MED ORDER — MORPHINE SULFATE 2 MG/ML IJ SOLN: 1.0000 mg | INTRAMUSCULAR | Status: DC | PRN

## 2013-05-29 MED ORDER — VITAMIN D-3 25 MCG (1000 UT) PO CAPS
1.0000 | ORAL_CAPSULE | Freq: Every day | ORAL | Status: DC
  Filled 2013-05-29: qty 1

## 2013-05-29 MED ORDER — PROPOFOL 10 MG/ML IV BOLUS
INTRAVENOUS | Status: DC | PRN
  Administered 2013-05-29: 180 mg via INTRAVENOUS

## 2013-05-29 MED ORDER — ATORVASTATIN CALCIUM 10 MG PO TABS
10.0000 mg | ORAL_TABLET | Freq: Every day | ORAL | Status: DC
  Administered 2013-05-29: 10 mg via ORAL
  Filled 2013-05-29 (×2): qty 1

## 2013-05-29 MED ORDER — ALPRAZOLAM 0.5 MG PO TABS
0.5000 mg | ORAL_TABLET | Freq: Every evening | ORAL | Status: DC | PRN
  Administered 2013-05-29: 0.5 mg via ORAL
  Filled 2013-05-29: qty 1

## 2013-05-29 MED ORDER — ZINC GLUCONATE 50 MG PO TABS: 50.0000 mg | ORAL_TABLET | Freq: Every day | ORAL | Status: DC

## 2013-05-29 MED ORDER — OXYCODONE HCL 5 MG PO TABS
ORAL_TABLET | ORAL | Status: AC
  Administered 2013-05-29: 5 mg via ORAL
  Filled 2013-05-29: qty 1

## 2013-05-29 MED ORDER — HYDROCODONE-ACETAMINOPHEN 5-325 MG PO TABS
1.0000 | ORAL_TABLET | Freq: Three times a day (TID) | ORAL | Status: DC
  Administered 2013-05-30: 1 via ORAL
  Filled 2013-05-29: qty 1

## 2013-05-29 MED ORDER — MENTHOL 3 MG MT LOZG: 1.0000 | LOZENGE | OROMUCOSAL | Status: DC | PRN

## 2013-05-29 MED ORDER — VITAMIN D3 25 MCG (1000 UNIT) PO TABS
1000.0000 [IU] | ORAL_TABLET | Freq: Every day | ORAL | Status: DC
  Filled 2013-05-29 (×2): qty 1

## 2013-05-29 MED ORDER — BUPIVACAINE HCL (PF) 0.5 % IJ SOLN
INTRAMUSCULAR | Status: DC | PRN
  Administered 2013-05-29: 2 mL

## 2013-05-29 MED ORDER — ROCURONIUM BROMIDE 100 MG/10ML IV SOLN
INTRAVENOUS | Status: DC | PRN
  Administered 2013-05-29: 50 mg via INTRAVENOUS
  Administered 2013-05-29: 10 mg via INTRAVENOUS

## 2013-05-29 MED ORDER — LOSARTAN POTASSIUM 50 MG PO TABS
50.0000 mg | ORAL_TABLET | Freq: Every evening | ORAL | Status: DC
  Administered 2013-05-29: 50 mg via ORAL
  Filled 2013-05-29 (×2): qty 1

## 2013-05-29 MED ORDER — NITROGLYCERIN 0.4 MG SL SUBL: 0.4000 mg | SUBLINGUAL_TABLET | SUBLINGUAL | Status: DC | PRN

## 2013-05-29 MED ORDER — LIDOCAINE-EPINEPHRINE 1 %-1:100000 IJ SOLN
INTRAMUSCULAR | Status: DC | PRN
  Administered 2013-05-29: 2 mL

## 2013-05-29 MED ORDER — ASPIRIN 325 MG PO TABS
325.0000 mg | ORAL_TABLET | Freq: Every day | ORAL | Status: DC
  Administered 2013-05-29: 325 mg via ORAL
  Filled 2013-05-29 (×2): qty 1

## 2013-05-29 MED ORDER — PHENYLEPHRINE HCL 10 MG/ML IJ SOLN
10.0000 mg | INTRAVENOUS | Status: DC | PRN
  Administered 2013-05-29: 20 ug/min via INTRAVENOUS

## 2013-05-29 MED ORDER — HYDROMORPHONE HCL PF 1 MG/ML IJ SOLN
INTRAMUSCULAR | Status: AC
  Administered 2013-05-29: 0.5 mg via INTRAVENOUS
  Filled 2013-05-29: qty 1

## 2013-05-29 MED ORDER — LACTATED RINGERS IV SOLN
INTRAVENOUS | Status: DC | PRN
  Administered 2013-05-29 (×2): via INTRAVENOUS

## 2013-05-29 MED ORDER — OXYCODONE-ACETAMINOPHEN 5-325 MG PO TABS
1.0000 | ORAL_TABLET | ORAL | Status: DC | PRN
  Filled 2013-05-29: qty 2

## 2013-05-29 MED ORDER — OXYCODONE HCL 5 MG/5ML PO SOLN: 5.0000 mg | Freq: Once | ORAL | Status: AC | PRN

## 2013-05-29 MED ORDER — DEXTROSE 5 % IV SOLN
500.0000 mg | Freq: Four times a day (QID) | INTRAVENOUS | Status: DC | PRN
  Filled 2013-05-29: qty 5

## 2013-05-29 MED ORDER — 0.9 % SODIUM CHLORIDE (POUR BTL) OPTIME
TOPICAL | Status: DC | PRN
  Administered 2013-05-29: 1000 mL

## 2013-05-29 MED ORDER — GLYCOPYRROLATE 0.2 MG/ML IJ SOLN
INTRAMUSCULAR | Status: DC | PRN
  Administered 2013-05-29: .6 mg via INTRAVENOUS

## 2013-05-29 MED ORDER — CALCIUM CARBONATE 600 MG PO TABS: 600.0000 mg | ORAL_TABLET | Freq: Every day | ORAL | Status: DC

## 2013-05-29 MED ORDER — LIDOCAINE HCL 4 % MT SOLN
OROMUCOSAL | Status: DC | PRN
  Administered 2013-05-29: 4 mL via TOPICAL

## 2013-05-29 MED ORDER — SODIUM CHLORIDE 0.9 % IJ SOLN
3.0000 mL | Freq: Two times a day (BID) | INTRAMUSCULAR | Status: DC
  Administered 2013-05-29 (×2): 3 mL via INTRAVENOUS

## 2013-05-29 MED ORDER — ACETAMINOPHEN 650 MG RE SUPP: 650.0000 mg | RECTAL | Status: DC | PRN

## 2013-05-29 MED ORDER — THERA M PLUS PO TABS: 1.0000 | ORAL_TABLET | Freq: Every day | ORAL | Status: DC

## 2013-05-29 MED ORDER — CEFAZOLIN SODIUM 1-5 GM-% IV SOLN
1.0000 g | Freq: Three times a day (TID) | INTRAVENOUS | Status: AC
  Administered 2013-05-29 – 2013-05-30 (×2): 1 g via INTRAVENOUS
  Filled 2013-05-29 (×3): qty 50

## 2013-05-29 MED ORDER — DEXAMETHASONE SODIUM PHOSPHATE 10 MG/ML IJ SOLN
INTRAMUSCULAR | Status: DC | PRN
  Administered 2013-05-29: 10 mg via INTRAVENOUS

## 2013-05-29 MED ORDER — HYDROMORPHONE HCL PF 1 MG/ML IJ SOLN
0.2500 mg | INTRAMUSCULAR | Status: DC | PRN
  Administered 2013-05-29 (×4): 0.5 mg via INTRAVENOUS

## 2013-05-29 MED ORDER — ADULT MULTIVITAMIN W/MINERALS CH
1.0000 | ORAL_TABLET | Freq: Every day | ORAL | Status: DC
  Filled 2013-05-29 (×2): qty 1

## 2013-05-29 SURGICAL SUPPLY — 75 items
ADH SKN CLS APL DERMABOND .7 (GAUZE/BANDAGES/DRESSINGS)
ADH SKN CLS LQ APL DERMABOND (GAUZE/BANDAGES/DRESSINGS) ×1
APL SKNCLS STERI-STRIP NONHPOA (GAUZE/BANDAGES/DRESSINGS)
BAG DECANTER FOR FLEXI CONT (MISCELLANEOUS) ×1 IMPLANT
BANDAGE GAUZE ELAST BULKY 4 IN (GAUZE/BANDAGES/DRESSINGS) IMPLANT
BENZOIN TINCTURE PRP APPL 2/3 (GAUZE/BANDAGES/DRESSINGS) IMPLANT
BIT DRILL 14MM (INSTRUMENTS) IMPLANT
BIT DRILL NEURO 2X3.1 SFT TUCH (MISCELLANEOUS) ×1 IMPLANT
BLADE ULTRA TIP 2M (BLADE) IMPLANT
BUR BARREL STRAIGHT FLUTE 4.0 (BURR) ×2 IMPLANT
CANISTER SUCT 3000ML (MISCELLANEOUS) ×2 IMPLANT
CONT SPEC 4OZ CLIKSEAL STRL BL (MISCELLANEOUS) ×2 IMPLANT
COVER MAYO STAND STRL (DRAPES) ×2 IMPLANT
DERMABOND ADHESIVE PROPEN (GAUZE/BANDAGES/DRESSINGS) ×1
DERMABOND ADVANCED (GAUZE/BANDAGES/DRESSINGS)
DERMABOND ADVANCED .7 DNX12 (GAUZE/BANDAGES/DRESSINGS) ×1 IMPLANT
DERMABOND ADVANCED .7 DNX6 (GAUZE/BANDAGES/DRESSINGS) IMPLANT
DRAPE LAPAROTOMY 100X72 PEDS (DRAPES) ×2 IMPLANT
DRAPE MICROSCOPE LEICA (MISCELLANEOUS) ×2 IMPLANT
DRAPE POUCH INSTRU U-SHP 10X18 (DRAPES) ×2 IMPLANT
DRAPE PROXIMA HALF (DRAPES) ×1 IMPLANT
DRESSING TELFA 8X3 (GAUZE/BANDAGES/DRESSINGS) IMPLANT
DRILL 14MM (INSTRUMENTS) ×2
DRILL NEURO 2X3.1 SOFT TOUCH (MISCELLANEOUS) ×2
DURAPREP 6ML APPLICATOR 50/CS (WOUND CARE) ×2 IMPLANT
ELECT COATED BLADE 2.86 ST (ELECTRODE) ×2 IMPLANT
ELECT REM PT RETURN 9FT ADLT (ELECTROSURGICAL) ×2
ELECTRODE REM PT RTRN 9FT ADLT (ELECTROSURGICAL) ×1 IMPLANT
GAUZE SPONGE 4X4 16PLY XRAY LF (GAUZE/BANDAGES/DRESSINGS) IMPLANT
GLOVE BIO SURGEON STRL SZ8 (GLOVE) ×2 IMPLANT
GLOVE BIOGEL PI IND STRL 7.0 (GLOVE) IMPLANT
GLOVE BIOGEL PI IND STRL 8 (GLOVE) ×1 IMPLANT
GLOVE BIOGEL PI IND STRL 8.5 (GLOVE) ×1 IMPLANT
GLOVE BIOGEL PI INDICATOR 7.0 (GLOVE) ×2
GLOVE BIOGEL PI INDICATOR 8 (GLOVE) ×1
GLOVE BIOGEL PI INDICATOR 8.5 (GLOVE) ×1
GLOVE ECLIPSE 7.5 STRL STRAW (GLOVE) ×1 IMPLANT
GLOVE ECLIPSE 8.0 STRL XLNG CF (GLOVE) ×2 IMPLANT
GLOVE EXAM NITRILE LRG STRL (GLOVE) IMPLANT
GLOVE EXAM NITRILE MD LF STRL (GLOVE) IMPLANT
GLOVE EXAM NITRILE XL STR (GLOVE) IMPLANT
GLOVE EXAM NITRILE XS STR PU (GLOVE) IMPLANT
GLOVE SURG SS PI 7.0 STRL IVOR (GLOVE) ×3 IMPLANT
GOWN BRE IMP SLV AUR LG STRL (GOWN DISPOSABLE) ×2 IMPLANT
GOWN BRE IMP SLV AUR XL STRL (GOWN DISPOSABLE) ×1 IMPLANT
GOWN STRL REIN 2XL LVL4 (GOWN DISPOSABLE) ×1 IMPLANT
HEAD HALTER (SOFTGOODS) ×2 IMPLANT
KIT BASIN OR (CUSTOM PROCEDURE TRAY) ×2 IMPLANT
KIT ROOM TURNOVER OR (KITS) ×2 IMPLANT
NDL HYPO 18GX1.5 BLUNT FILL (NEEDLE) IMPLANT
NDL HYPO 25X1 1.5 SAFETY (NEEDLE) ×1 IMPLANT
NDL SPNL 22GX3.5 QUINCKE BK (NEEDLE) ×1 IMPLANT
NEEDLE HYPO 18GX1.5 BLUNT FILL (NEEDLE) IMPLANT
NEEDLE HYPO 25X1 1.5 SAFETY (NEEDLE) ×2 IMPLANT
NEEDLE SPNL 22GX3.5 QUINCKE BK (NEEDLE) ×2 IMPLANT
NS IRRIG 1000ML POUR BTL (IV SOLUTION) ×2 IMPLANT
PACK LAMINECTOMY NEURO (CUSTOM PROCEDURE TRAY) ×2 IMPLANT
PAD ARMBOARD 7.5X6 YLW CONV (MISCELLANEOUS) ×6 IMPLANT
PIN DISTRACTION 14MM (PIN) ×4 IMPLANT
PLATE 16MM (Plate) ×1 IMPLANT
RUBBERBAND STERILE (MISCELLANEOUS) ×4 IMPLANT
SCREW 14MM (Screw) ×4 IMPLANT
SPACER ASSEM CERV LORD 8M (Spacer) ×1 IMPLANT
SPONGE GAUZE 4X4 12PLY (GAUZE/BANDAGES/DRESSINGS) IMPLANT
SPONGE INTESTINAL PEANUT (DISPOSABLE) ×2 IMPLANT
SPONGE SURGIFOAM ABS GEL SZ50 (HEMOSTASIS) ×2 IMPLANT
STAPLER SKIN PROX WIDE 3.9 (STAPLE) IMPLANT
STRIP CLOSURE SKIN 1/2X4 (GAUZE/BANDAGES/DRESSINGS) IMPLANT
SUT VIC AB 3-0 SH 8-18 (SUTURE) ×4 IMPLANT
SYR 20ML ECCENTRIC (SYRINGE) ×2 IMPLANT
SYR 3ML LL SCALE MARK (SYRINGE) IMPLANT
TOWEL OR 17X24 6PK STRL BLUE (TOWEL DISPOSABLE) ×2 IMPLANT
TOWEL OR 17X26 10 PK STRL BLUE (TOWEL DISPOSABLE) ×2 IMPLANT
TRAP SPECIMEN MUCOUS 40CC (MISCELLANEOUS) ×2 IMPLANT
WATER STERILE IRR 1000ML POUR (IV SOLUTION) ×2 IMPLANT

## 2013-05-29 NOTE — Preoperative (Signed)
Beta Blockers   Reason not to administer Beta Blockers:Not Applicable 

## 2013-05-29 NOTE — Plan of Care (Signed)
Problem: Consults Goal: Diagnosis - Spinal Surgery Outcome: Completed/Met Date Met:  05/29/13 Cervical Spine Fusion

## 2013-05-29 NOTE — Progress Notes (Signed)
Awake, alert, conversant.  Full strength both upper extremities.  Pain much improved from preop.  Doing well.

## 2013-05-29 NOTE — Progress Notes (Signed)
Utilization review completed.  

## 2013-05-29 NOTE — Progress Notes (Signed)
Pt. Shaved his neck this a.m., skin somewhat reddened & irritated .  Pt. Given cool washcloth, pt. Reports that he is feeling better.

## 2013-05-29 NOTE — Anesthesia Postprocedure Evaluation (Signed)
  Anesthesia Post-op Note  Patient: Edward [name]  Procedure(s) Performed: Procedure(s) with comments: Cervical five-six Anterior cervical decompression/diskectomy/fusion with exploration of Cervical six-seven, hardware removal  (N/A) - Cervical five-six Anterior cervical decompression/diskectomy/fusion with exploration of Cervical six-seven, Hardware removal   Patient Location: PACU  Anesthesia Type:General  Level of Consciousness: awake and alert   Airway and Oxygen Therapy: Patient Spontanous Breathing  Post-op Pain: mild  Post-op Assessment: Post-op Vital signs reviewed, Patient's Cardiovascular Status Stable, Respiratory Function Stable, Patent Airway, No signs of Nausea or vomiting and Pain level controlled  Post-op Vital Signs: Reviewed and stable  Complications: No apparent anesthesia complications

## 2013-05-29 NOTE — Op Note (Signed)
05/29/2013  1:35 PM  PATIENT:  Edward [name]  62 y.o. 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, Cervical degenerative disc disease C 56  PROCEDURE:  Procedure(s) with comments: Cervical five-six Anterior cervical decompression/diskectomy/fusion with exploration of Cervical six-seven, hardware removal  (N/A) - Cervical five-six Anterior cervical decompression/diskectomy/fusion with exploration of Cervical six-seven, Hardware removal with allograft/autograft bone graft and anterior cervical plate   SURGEON:  Surgeon(s) and Role:    * Ezana Hubbert, MD - Primary    * Dempster R Hirsch, MD - Assisting  PHYSICIAN ASSISTANT:   ASSISTANTS: Poteat, RN   ANESTHESIA:   general  EBL:  Total I/O In: 1000 [I.V.:1000] Out: -   BLOOD ADMINISTERED:none  DRAINS: none   LOCAL MEDICATIONS USED:  LIDOCAINE   SPECIMEN:  No Specimen  DISPOSITION OF SPECIMEN:  N/A  COUNTS:  YES  TOURNIQUET:  * No tourniquets in log *  DICTATION: Patient is 62 year old male with HNP C 56 Left with neck pain and prior ACDF C 67.  It was elected to take him to surgery for anterior cervical decompression and fusion C 56 level and exploration of C 67 fusion.  PROCEDURE: Patient was brought to operating room and following the smooth and uncomplicated induction of general endotracheal anesthesia his head was placed on a horseshoe head holder he was placed in 5 pounds of Holter traction and his anterior neck was prepped and draped in usual sterile fashion. An incision was made on the left side of midline after infiltrating the skin and subcutaneous tissues with local lidocaine through patient's prior scar. The platysmal layer was incised and subplatysmal dissection was performed exposing the anterior border  sternocleidomastoid muscle. Using blunt dissection the carotid sheath was kept lateral and trachea and esophagus kept medial exposing the anterior cervical spine. The previously placed plate was cleared of scar tissue and removed.  The fusion was carefully inspected and there did not appear to be abnormal motion or suggestion of pseudoarthrosis.  The fusion mass appeared to be confluent without disruption. Longus coli muscles were taken down from the anterior cervical spine using electrocautery and key elevator and self-retaining retractor was placed exposing the C 56 level. The interspace was incised and a thorough discectomy was performed. Distraction pins were placed. Uncinate spurs and central spondylitic ridges were drilled down with a high-speed drill. The spinal cord dura and both C6 nerve roots were widely decompressed. A large amount of herniated disc material was removed from overlying the left C 6 nerve root. Hemostasis was assured. After trial sizing an 8 mm lordotic allograft bone wedge was selected and packed with local autograft. This was tamped into position and countersunk appropriately. Distraction weight was removed. A 16 mm trestle luxe anterior cervical plate was affixed to the cervical spine with 14 mm variable-angle screws 2 at C5, 2 at C6. All screws were well-positioned and locking mechanisms were engaged. A final X ray was obtained which showed well positioned graft and anterior plate without complicating features at the superior aspect and at the correct level. Soft tissues were inspected and found to be in good repair. The wound was irrigated. The platysma layer was closed with 3-0 Vicryl stitches and the skin was reapproximated with 3-0 Vicryl subcuticular stitches. The wound was dressed with Dermabond. Counts were correct at the end of the case. Patient   was extubated and taken to recovery in stable and satisfactory condition.   PLAN OF CARE: Admit to inpatient   PATIENT  DISPOSITION:  PACU - hemodynamically stable.   Delay start of Pharmacological VTE agent (>24hrs) due to surgical blood loss or risk of bleeding: yes  

## 2013-05-29 NOTE — Brief Op Note (Signed)
05/29/2013  1:35 PM  PATIENT:  Edward [name]  62 y.o. 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, Cervical degenerative disc disease C 56  PROCEDURE:  Procedure(s) with comments: Cervical five-six Anterior cervical decompression/diskectomy/fusion with exploration of Cervical six-seven, hardware removal  (N/A) - Cervical five-six Anterior cervical decompression/diskectomy/fusion with exploration of Cervical six-seven, Hardware removal with allograft/autograft bone graft and anterior cervical plate   SURGEON:  Surgeon(s) and Role:    * Maeola Harman, MD - Primary    * Clydene Fake, MD - Assisting  PHYSICIAN ASSISTANT:   ASSISTANTS: Poteat, RN   ANESTHESIA:   general  EBL:  Total I/O In: 1000 [I.V.:1000] Out: -   BLOOD ADMINISTERED:none  DRAINS: none   LOCAL MEDICATIONS USED:  LIDOCAINE   SPECIMEN:  No Specimen  DISPOSITION OF SPECIMEN:  N/A  COUNTS:  YES  TOURNIQUET:  * No tourniquets in log *  DICTATION: Patient is 62 year old male with HNP C 56 Left with neck pain and prior ACDF C 67.  It was elected to take him to surgery for anterior cervical decompression and fusion C 56 level and exploration of C 67 fusion.  PROCEDURE: Patient was brought to operating room and following the smooth and uncomplicated induction of general endotracheal anesthesia his head was placed on a horseshoe head holder he was placed in 5 pounds of Holter traction and his anterior neck was prepped and draped in usual sterile fashion. An incision was made on the left side of midline after infiltrating the skin and subcutaneous tissues with local lidocaine through patient's prior scar. The platysmal layer was incised and subplatysmal dissection was performed exposing the anterior border  sternocleidomastoid muscle. Using blunt dissection the carotid sheath was kept lateral and trachea and esophagus kept medial exposing the anterior cervical spine. The previously placed plate was cleared of scar tissue and removed.  The fusion was carefully inspected and there did not appear to be abnormal motion or suggestion of pseudoarthrosis.  The fusion mass appeared to be confluent without disruption. Longus coli muscles were taken down from the anterior cervical spine using electrocautery and key elevator and self-retaining retractor was placed exposing the C 56 level. The interspace was incised and a thorough discectomy was performed. Distraction pins were placed. Uncinate spurs and central spondylitic ridges were drilled down with a high-speed drill. The spinal cord dura and both C6 nerve roots were widely decompressed. A large amount of herniated disc material was removed from overlying the left C 6 nerve root. Hemostasis was assured. After trial sizing an 8 mm lordotic allograft bone wedge was selected and packed with local autograft. This was tamped into position and countersunk appropriately. Distraction weight was removed. A 16 mm trestle luxe anterior cervical plate was affixed to the cervical spine with 14 mm variable-angle screws 2 at C5, 2 at C6. All screws were well-positioned and locking mechanisms were engaged. A final X ray was obtained which showed well positioned graft and anterior plate without complicating features at the superior aspect and at the correct level. Soft tissues were inspected and found to be in good repair. The wound was irrigated. The platysma layer was closed with 3-0 Vicryl stitches and the skin was reapproximated with 3-0 Vicryl subcuticular stitches. The wound was dressed with Dermabond. Counts were correct at the end of the case. Patient  was extubated and taken to recovery in stable and satisfactory condition.   PLAN OF CARE: Admit to inpatient   PATIENT  DISPOSITION:  PACU - hemodynamically stable.   Delay start of Pharmacological VTE agent (>24hrs) due to surgical blood loss or risk of bleeding: yes

## 2013-05-29 NOTE — Progress Notes (Signed)
Patient ID: Edward [name], male   DOB: 03/04/1951, 62 y.o.   MRN: [id number]  Post-Op: Awake, MAEW. Good strength BUE & hand intrinsics. Denies pain at present.  Will plan for 3500 bed for observation.  Georgiann Cocker, RN, BSN

## 2013-05-29 NOTE — Interval H&P Note (Signed)
History and Physical Interval Note:  05/29/2013 10:45 AM  Edward [name]  has presented today for surgery, with the diagnosis of Cervical radiculopathy, Cervicalgia, Cervical hnp without myelopathy, Cervical degenerative disc disease  The various methods of treatment have been discussed with the patient and family. After consideration of risks, benefits and other options for treatment, the patient has consented to  Procedure(s) with comments: ANTERIOR CERVICAL DECOMPRESSION/DISCECTOMY FUSION 1 LEVEL (N/A) - C5-6 Anterior cervical decompression/diskectomy/fusion with exploration of C6-7 as a surgical intervention .  The patient's history has been reviewed, patient examined, no change in status, stable for surgery.  I have reviewed the patient's chart and labs.  Questions were answered to the patient's satisfaction.     [name] D

## 2013-05-29 NOTE — Anesthesia Preprocedure Evaluation (Addendum)
Anesthesia Evaluation  Patient identified by MRN, date of birth, ID band Patient awake    Reviewed: Allergy & Precautions, H&P , NPO status , Patient's Chart, lab work & pertinent test results  Airway Mallampati: II TM Distance: >3 FB Neck ROM: Full    Dental   Pulmonary former smoker,  breath sounds clear to auscultation        Cardiovascular hypertension, + angina at rest + CAD Rhythm:Regular Rate:Normal  Chest pain 2-3 weeks ago relieved with NTG SL   Neuro/Psych Anxiety Depression    GI/Hepatic GERD-  ,  Endo/Other    Renal/GU      Musculoskeletal   Abdominal   Peds  Hematology   Anesthesia Other Findings   Reproductive/Obstetrics                          Anesthesia Physical Anesthesia Plan  ASA: III  Anesthesia Plan: General   Post-op Pain Management:    Induction: Intravenous  Airway Management Planned: Oral ETT  Additional Equipment:   Intra-op Plan:   Post-operative Plan: Extubation in OR  Informed Consent: I have reviewed the patients History and Physical, chart, labs and discussed the procedure including the risks, benefits and alternatives for the proposed anesthesia with the patient or authorized representative who has indicated his/her understanding and acceptance.     Plan Discussed with: CRNA and Surgeon  Anesthesia Plan Comments:         Anesthesia Quick Evaluation

## 2013-05-29 NOTE — Anesthesia Procedure Notes (Signed)
Procedure Name: Intubation Date/Time: 05/29/2013 10:56 AM Performed by: Coralee Rud Pre-anesthesia Checklist: Patient identified, Emergency Drugs available, Suction available, Patient being monitored and Timeout performed Patient Re-evaluated:Patient Re-evaluated prior to inductionOxygen Delivery Method: Circle system utilized Preoxygenation: Pre-oxygenation with 100% oxygen Intubation Type: IV induction Ventilation: Mask ventilation without difficulty Laryngoscope Size: Miller and 3 Grade View: Grade I Tube type: Oral Tube size: 8.0 mm Number of attempts: 1 Airway Equipment and Method: Stylet Placement Confirmation: ETT inserted through vocal cords under direct vision and positive ETCO2 Secured at: 22 cm Tube secured with: Tape Dental Injury: Teeth and Oropharynx as per pre-operative assessment

## 2013-05-29 NOTE — Transfer of Care (Signed)
Immediate Anesthesia Transfer of Care Note  Patient: Edward [name]  Procedure(s) Performed: Procedure(s) with comments: Cervical five-six Anterior cervical decompression/diskectomy/fusion with exploration of Cervical six-seven, hardware removal  (N/A) - Cervical five-six Anterior cervical decompression/diskectomy/fusion with exploration of Cervical six-seven, Hardware removal   Patient Location: PACU  Anesthesia Type:General  Level of Consciousness: awake, alert , oriented and patient cooperative  Airway & Oxygen Therapy: Patient Spontanous Breathing and Patient connected to nasal cannula oxygen  Post-op Assessment: Report given to PACU RN, Post -op Vital signs reviewed and stable and Patient moving all extremities  Post vital signs: Reviewed and stable  Complications: No apparent anesthesia complications

## 2013-05-30 ENCOUNTER — Other Ambulatory Visit (HOSPITAL_COMMUNITY): Payer: Medicare Other

## 2013-05-30 ENCOUNTER — Encounter (HOSPITAL_COMMUNITY): Payer: Self-pay | Admitting: Neurosurgery

## 2013-05-30 NOTE — Progress Notes (Signed)
Subjective: Patient reports "All that left shoulder and arm pain is gone...just sore across my shoulders"  Objective: Vital signs in last 24 hours: Temp:  [97.6 F (36.4 C)-98.4 F (36.9 C)] 97.9 F (36.6 C) (11/21 0303) Pulse Rate:  [54-83] 65 (11/21 0303) Resp:  [9-20] 16 (11/21 0303) BP: (115-147)/(59-76) 129/68 mmHg (11/21 0303) SpO2:  [93 %-100 %] 93 % (11/21 0303)  Intake/Output from previous day: 11/20 0701 - 11/21 0700 In: 1720 [P.O.:720; I.V.:1000] Out: -  Intake/Output this shift:    Alert, conversant. LUE pain resolved. Soreness across shoulders - reassured. Incision with dermabond. No erythema, swelling, or drainage. Full strength BUE.   Lab Results:  Recent Labs  05/27/13 1611  WBC 7.6  HGB 15.2  HCT 42.8  PLT 174   BMET  Recent Labs  05/27/13 1611  NA 137  K 4.6  CL 102  CO2 27  GLUCOSE 96  BUN 9  CREATININE 0.83  CALCIUM 9.4    Studies/Results: Dg Cervical Spine 1 View  05/29/2013   CLINICAL DATA:  ACDF C5-6  EXAM: DG CERVICAL SPINE - 1 VIEW  COMPARISON:  04/14/2013  FINDINGS: Single lateral view of the cervical spine reveals poor penetration of the lower cervical spine. Anterior plate and screws at C5-6. Previous study show anterior plate at R6-0 which is not visualized.  IMPRESSION: ACDF at C5-6. Followup radiographs are suggested to confirm position.   Electronically Signed   By: Marlan Palau M.D.   On: 05/29/2013 14:03    Assessment/Plan: Improved    LOS: 1 day  Per DrStern, d/cIV, d/c to home. Pt states he has pain meds at home from Dr. Ethelene Hal (pain mgt) & will d/w him resuming his Flexeril. Pt verbalizes understanding of d/c instructions & agrees to call ofiice to schedule 3-4 week f/u appt.   Georgiann Cocker 05/30/2013, 7:53 AM

## 2013-05-30 NOTE — Progress Notes (Signed)
Pt. Alert and oriented, follows simple instructions, denies pain. Incision area without swelling, redness or S/S of infection. Voiding adequate clear yellow urine. Moving all extremities well and vitals stable and documented. Patient discharged home with family. Anterior Cervical Fusion surgery notes instructions given to patient and family member for home safety and precautions. Pt. and family stated understanding of instructions given.  

## 2013-05-30 NOTE — Discharge Summary (Signed)
Physician Discharge Summary  Patient ID: Edward [name] MRN: [id number] DOB/AGE: December 30, 1950 62 y.o.  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 *   Discharged Condition: good  Hospital Course: Edward [name] was admitted for surgery with Dx HNP C5-6 and radiculopathy.  Following uncomplicated ACDF C5-6, he recovered nicely in Neuro PACU & transferred to 3500 for observation.   Consults: None  Significant Diagnostic Studies: radiology: X-Ray: intra-operative  Treatments: surgery: Cervical five-six Anterior cervical decompression/diskectomy/fusion with exploration of Cervical six-seven, hardware removal  (N/A) - Cervical five-six Anterior cervical decompression/diskectomy/fusion with exploration of Cervical six-seven, Hardware removal with allograft/autograft bone graft and anterior cervical plate   Discharge Exam: Blood pressure 129/68, pulse 65, temperature 97.9 F (36.6 C), temperature source Oral, resp. rate 16, height 0' (0 m), SpO2 93.00%. Alert, conversant. LUE pain resolved. Soreness across shoulders - reassured. Incision with dermabond. No erythema, swelling, or drainage. Full strength BUE.     Disposition: 01-Home or Self Care  Pt states he has pain meds at home from Dr. Ethelene Hal (pain mgt) & will  d/w him resuming his Flexeril. Pt verbalizes understanding of d/c instructions & agrees to call ofiice to schedule 3-4 week f/u appt.      Medication List    ASK your doctor about these medications       ALPRAZolam 0.5 MG tablet  Commonly known as:  XANAX  Take 0.5 mg by mouth at bedtime as needed for anxiety.     aspirin 325 MG tablet  Take 325 mg by mouth at bedtime.     Biotin 1000 MCG tablet  Take 1,000 mcg by mouth daily.     calcium carbonate 600 MG Tabs tablet  Commonly known as:  OS-CAL  Take 600 mg by mouth daily.     CoQ10 100 MG Caps  Take 1 capsule by mouth daily.     Fish Oil 1000 MG Caps  Take 1 capsule by mouth daily.     FLAXSEED OIL PO  Take 2,400 mg by mouth daily.     HYDROcodone-acetaminophen 5-325 MG per tablet  Commonly known as:  NORCO/VICODIN  Take 1 tablet by mouth every 8 (eight) hours.     Krill Oil 300 MG Caps  Take 1 capsule by mouth daily.     losartan 100 MG tablet  Commonly known as:  COZAAR  Take 50 mg by mouth every evening.     magnesium oxide 400 MG tablet  Commonly known as:  MAG-OX  Take 400 mg by mouth daily.     multivitamins ther. w/minerals Tabs tablet  Take 1 tablet by mouth daily.     nitroGLYCERIN 0.4 MG SL tablet  Commonly known as:  NITROSTAT  Place 0.4 mg under the tongue every 5 (five) minutes as needed for chest pain.     pantoprazole 40 MG tablet  Commonly known as:  PROTONIX  Take 40 mg by mouth daily as needed.     ranolazine 500 MG 12 hr tablet  Commonly  known as:  RANEXA  Take 250 mg by mouth daily.     rosuvastatin 20 MG tablet  Commonly known as:  CRESTOR  Take 20 mg by mouth every evening.     vitamin B-12 1000 MCG tablet  Commonly known as:  CYANOCOBALAMIN  Take 1,000 mcg by mouth daily.     Vitamin D-3 1000 UNITS Caps  Take 1 capsule by mouth daily.     vitamin E 400 UNIT capsule  Take 400 Units by mouth daily.     zinc gluconate 50 MG tablet  Take 50 mg by mouth daily.          Signed: Georgiann Cocker 05/30/2013, 7:57 AM

## 2013-07-11 ENCOUNTER — Other Ambulatory Visit: Payer: Self-pay | Admitting: Interventional Cardiology

## 2013-09-04 ENCOUNTER — Other Ambulatory Visit: Payer: Self-pay | Admitting: Cardiology

## 2013-10-13 ENCOUNTER — Encounter (HOSPITAL_COMMUNITY): Payer: Self-pay | Admitting: Emergency Medicine

## 2013-10-13 ENCOUNTER — Emergency Department (HOSPITAL_COMMUNITY)
Admission: EM | Admit: 2013-10-13 | Discharge: 2013-10-13 | Disposition: A | Discharge status: Home / Self Care | Payer: Medicare Other | Attending: Emergency Medicine | Admitting: Emergency Medicine

## 2013-10-13 DIAGNOSIS — I2510 Atherosclerotic heart disease of native coronary artery without angina pectoris: Secondary | ICD-10-CM | POA: Insufficient documentation

## 2013-10-13 DIAGNOSIS — M129 Arthropathy, unspecified: Secondary | ICD-10-CM | POA: Insufficient documentation

## 2013-10-13 DIAGNOSIS — F411 Generalized anxiety disorder: Secondary | ICD-10-CM | POA: Insufficient documentation

## 2013-10-13 DIAGNOSIS — E7800 Pure hypercholesterolemia, unspecified: Secondary | ICD-10-CM | POA: Insufficient documentation

## 2013-10-13 DIAGNOSIS — Z87891 Personal history of nicotine dependence: Secondary | ICD-10-CM | POA: Insufficient documentation

## 2013-10-13 DIAGNOSIS — Z860100 Personal history of colon polyps, unspecified: Secondary | ICD-10-CM | POA: Insufficient documentation

## 2013-10-13 DIAGNOSIS — Z95818 Presence of other cardiac implants and grafts: Secondary | ICD-10-CM | POA: Insufficient documentation

## 2013-10-13 DIAGNOSIS — F329 Major depressive disorder, single episode, unspecified: Secondary | ICD-10-CM | POA: Insufficient documentation

## 2013-10-13 DIAGNOSIS — F3289 Other specified depressive episodes: Secondary | ICD-10-CM | POA: Insufficient documentation

## 2013-10-13 DIAGNOSIS — I209 Angina pectoris, unspecified: Secondary | ICD-10-CM | POA: Insufficient documentation

## 2013-10-13 DIAGNOSIS — S91009A Unspecified open wound, unspecified ankle, initial encounter: Principal | ICD-10-CM

## 2013-10-13 DIAGNOSIS — K219 Gastro-esophageal reflux disease without esophagitis: Secondary | ICD-10-CM | POA: Insufficient documentation

## 2013-10-13 DIAGNOSIS — IMO0002 Reserved for concepts with insufficient information to code with codable children: Secondary | ICD-10-CM

## 2013-10-13 DIAGNOSIS — S81009A Unspecified open wound, unspecified knee, initial encounter: Secondary | ICD-10-CM | POA: Insufficient documentation

## 2013-10-13 DIAGNOSIS — Z23 Encounter for immunization: Secondary | ICD-10-CM | POA: Insufficient documentation

## 2013-10-13 DIAGNOSIS — Z9889 Other specified postprocedural states: Secondary | ICD-10-CM | POA: Insufficient documentation

## 2013-10-13 DIAGNOSIS — Z85828 Personal history of other malignant neoplasm of skin: Secondary | ICD-10-CM | POA: Insufficient documentation

## 2013-10-13 DIAGNOSIS — Z79899 Other long term (current) drug therapy: Secondary | ICD-10-CM | POA: Insufficient documentation

## 2013-10-13 DIAGNOSIS — Z87448 Personal history of other diseases of urinary system: Secondary | ICD-10-CM | POA: Insufficient documentation

## 2013-10-13 DIAGNOSIS — Y9389 Activity, other specified: Secondary | ICD-10-CM | POA: Insufficient documentation

## 2013-10-13 DIAGNOSIS — S81809A Unspecified open wound, unspecified lower leg, initial encounter: Principal | ICD-10-CM

## 2013-10-13 DIAGNOSIS — I10 Essential (primary) hypertension: Secondary | ICD-10-CM | POA: Insufficient documentation

## 2013-10-13 DIAGNOSIS — W2209XA Striking against other stationary object, initial encounter: Secondary | ICD-10-CM | POA: Insufficient documentation

## 2013-10-13 DIAGNOSIS — Z7982 Long term (current) use of aspirin: Secondary | ICD-10-CM | POA: Insufficient documentation

## 2013-10-13 DIAGNOSIS — Y929 Unspecified place or not applicable: Secondary | ICD-10-CM | POA: Insufficient documentation

## 2013-10-13 MED ORDER — LIDOCAINE HCL (PF) 1 % IJ SOLN
5.0000 mL | Freq: Once | INTRAMUSCULAR | Status: AC
  Administered 2013-10-13: 5 mL
  Filled 2013-10-13: qty 5

## 2013-10-13 MED ORDER — TETANUS-DIPHTH-ACELL PERTUSSIS 5-2.5-18.5 LF-MCG/0.5 IM SUSP
0.5000 mL | Freq: Once | INTRAMUSCULAR | Status: AC
  Administered 2013-10-13: 0.5 mL via INTRAMUSCULAR
  Filled 2013-10-13: qty 0.5

## 2013-10-13 NOTE — ED Notes (Signed)
Pt arrives with 3 mm laceration to left anterior ankle, states he was cutting wood and a piece of metal flew off and hit his leg. Still actively at initial assessment, pressure dressing applied.

## 2013-10-13 NOTE — Discharge Instructions (Signed)
Laceration Care, Adult °A laceration is a cut that goes through all layers of the skin. The cut goes into the tissue beneath the skin. °HOME CARE °For stitches (sutures) or staples: °· Keep the cut clean and dry. °· If you have a bandage (dressing), change it at least once a day. Change the bandage if it gets wet or dirty, or as told by your doctor. °· Wash the cut with soap and water 2 times a day. Rinse the cut with water. Pat it dry with a clean towel. °· Put a thin layer of medicated cream on the cut as told by your doctor. °· You may shower after the first 24 hours. Do not soak the cut in water until the stitches are removed. °· Only take medicines as told by your doctor. °· Have your stitches or staples removed as told by your doctor. °For skin adhesive strips: °· Keep the cut clean and dry. °· Do not get the strips wet. You may take a bath, but be careful to keep the cut dry. °· If the cut gets wet, pat it dry with a clean towel. °· The strips will fall off on their own. Do not remove the strips that are still stuck to the cut. °For wound glue: °· You may shower or take baths. Do not soak or scrub the cut. Do not swim. Avoid heavy sweating until the glue falls off on its own. After a shower or bath, pat the cut dry with a clean towel. °· Do not put medicine on your cut until the glue falls off. °· If you have a bandage, do not put tape over the glue. °· Avoid lots of sunlight or tanning lamps until the glue falls off. Put sunscreen on the cut for the first year to reduce your scar. °· The glue will fall off on its own. Do not pick at the glue. °You may need a tetanus shot if: °· You cannot remember when you had your last tetanus shot. °· You have never had a tetanus shot. °If you need a tetanus shot and you choose not to have one, you may get tetanus. Sickness from tetanus can be serious. °GET HELP RIGHT AWAY IF:  °· Your pain does not get better with medicine. °· Your arm, hand, leg, or foot loses feeling  (numbness) or changes color. °· Your cut is bleeding. °· Your joint feels weak, or you cannot use your joint. °· You have painful lumps on your body. °· Your cut is red, puffy (swollen), or painful. °· You have a red line on the skin near the cut. °· You have yellowish-white fluid (pus) coming from the cut. °· You have a fever. °· You have a bad smell coming from the cut or bandage. °· Your cut breaks open before or after stitches are removed. °· You notice something coming out of the cut, such as wood or glass. °· You cannot move a finger or toe. °MAKE SURE YOU:  °· Understand these instructions. °· Will watch your condition. °· Will get help right away if you are not doing well or get worse. °Document Released: 12/13/2007 Document Revised: 09/18/2011 Document Reviewed: 12/20/2010 °[employer]® Patient Information ©2014 [employer]. ° ° ° ° °

## 2013-10-13 NOTE — ED Notes (Signed)
Pt had a piece metal hit his left ankle and now has a lac to left anterior ankle

## 2013-10-13 NOTE — ED Provider Notes (Signed)
CSN: [id number]     Arrival date & time 10/13/13  1621 History   First MD Initiated Contact with Patient 10/13/13 1747     Chief Complaint  Patient presents with  . Extremity Laceration     (Consider location/radiation/quality/duration/timing/severity/associated sxs/prior Treatment) HPI Comments: Edward [name] is a 63 y.o. male who presents to the Emergency Department complaining of laceration to the left lower leg near the ankle.  Patient reports laceration occurred from a piece of metal.  Denies numbness or weakness of the ankle, or possible foreign body.  Bleeding controled prior to ED arrival per patient.  He is unsure of last tetanus.    The history is provided by the patient.    Past Medical History  Diagnosis Date  . Hypertension   . History of meniscal tear   . GERD (gastroesophageal reflux disease)   . Depression   . CAD (coronary artery disease)     with high grade obstruction second obtuse marginal and diffuse LAD and circumflex disease by cath 2010  . Erectile dysfunction   . Elevated cholesterol     LDL Goal < 70  . History of nuclear stress test 5/10    no ischemia  . Colon polyp   . Anginal pain     nitroglycerin last used - early November  . Anxiety     claustrophobic  . Cancer     skin cancer, on R ear - basal cell   . Arthritis     throughout arms, knees & back & neck   Past Surgical History  Procedure Laterality Date  . Shoulder surgery      x3 left / x1 right  . Back surgery    . Cervical fusion    . Knee arthroscopy  06/13/2011    Procedure: ARTHROSCOPY KNEE;  Surgeon: Tobi Bastos;  Location: [facility];  Service: Orthopedics;  Laterality: Right;  Right Knee Arthroscopy with Medial Menisectomy  . Cardiac catheterization      2004 & 2/10, normal LV function  . Finger fracture surgery      by Dr. Daylene Katayama  . Anterior cervical decomp/discectomy fusion N/A 05/29/2013    Procedure: Cervical five-six Anterior cervical decompression/diskectomy/fusion with  exploration of Cervical six-seven, hardware removal ;  Surgeon: Erline Levine, MD;  Location: [facility];  Service: Neurosurgery;  Laterality: N/A;  Cervical five-six Anterior cervical decompression/diskectomy/fusion with exploration of Cervical six-seven, Hardware removal   . Neck surgery  05/29/2013   Family History  Problem Relation Age of Onset  . Heart attack Mother   . Heart attack Sister   . CAD Sister   . CAD Sister     stents  . CVA Sister    History  Substance Use Topics  . Smoking status: Former Smoker    Quit date: 07/10/1998  . Smokeless tobacco: Former Systems developer    Quit date: 05/27/1998  . Alcohol Use: No    Review of Systems  Constitutional: Negative for fever and chills.  Musculoskeletal: Negative for arthralgias, back pain and joint swelling.  Skin: Positive for wound.       Laceration left lower leg  Neurological: Negative for dizziness, weakness and numbness.  Hematological: Does not bruise/bleed easily.  All other systems reviewed and are negative.     Allergies  Review of patient's allergies indicates no known allergies.  Home Medications   Current Outpatient Rx  Name  Route  Sig  Dispense  Refill  . ALPRAZolam (XANAX) 0.5 MG tablet  Oral   Take 0.5 mg by mouth at bedtime as needed for anxiety.         Marland Kitchen aspirin 325 MG tablet   Oral   Take 325 mg by mouth at bedtime.          . Biotin 1000 MCG tablet   Oral   Take 1,000 mcg by mouth daily.         . calcium carbonate (OS-CAL) 600 MG TABS   Oral   Take 600 mg by mouth daily.          . Cholecalciferol (VITAMIN D-3) 1000 UNITS CAPS   Oral   Take 1 capsule by mouth daily.         . Coenzyme Q10 (COQ10) 100 MG CAPS   Oral   Take 1 capsule by mouth daily.          . CRESTOR 20 MG tablet      Take 1 tablet by mouth once a day   30 tablet   3   . Flaxseed, Linseed, (FLAXSEED OIL PO)   Oral   Take 2,400 mg by mouth daily.         Marland Kitchen HYDROcodone-acetaminophen  (NORCO/VICODIN) 5-325 MG per tablet   Oral   Take 1 tablet by mouth every 8 (eight) hours.         Javier Docker Oil 300 MG CAPS   Oral   Take 1 capsule by mouth daily.         Marland Kitchen losartan (COZAAR) 100 MG tablet   Oral   Take 50 mg by mouth every evening.          . magnesium oxide (MAG-OX) 400 MG tablet   Oral   Take 400 mg by mouth daily.         . Multiple Vitamins-Minerals (MULTIVITAMINS THER. W/MINERALS) TABS   Oral   Take 1 tablet by mouth daily.          . nitroGLYCERIN (NITROSTAT) 0.4 MG SL tablet   Sublingual   Place 0.4 mg under the tongue every 5 (five) minutes as needed for chest pain.         . Omega-3 Fatty Acids (FISH OIL) 1000 MG CAPS   Oral   Take 1 capsule by mouth daily.          . pantoprazole (PROTONIX) 40 MG tablet   Oral   Take 40 mg by mouth daily as needed (for acid reflux).          Marland Kitchen RANEXA 500 MG 12 hr tablet      Take 1 tablet by mouth  daily   90 tablet   1   . vitamin B-12 (CYANOCOBALAMIN) 1000 MCG tablet   Oral   Take 1,000 mcg by mouth daily.         . vitamin E 400 UNIT capsule   Oral   Take 400 Units by mouth daily.          Marland Kitchen zinc gluconate 50 MG tablet   Oral   Take 50 mg by mouth daily.          BP 136/90  Pulse 73  Temp(Src) 99.6 F (37.6 C) (Oral)  Resp 14  Wt 195 lb (88.451 kg)  SpO2 97% Physical Exam  Nursing note and vitals reviewed. Constitutional: He is oriented to person, place, and time. He appears well-developed and well-nourished. No distress.  HENT:  Head: Normocephalic and atraumatic.  Cardiovascular: Normal rate, regular  rhythm, normal heart sounds and intact distal pulses.   No murmur heard. Pulmonary/Chest: Effort normal and breath sounds normal. No respiratory distress.  Musculoskeletal: He exhibits no edema and no tenderness.  Neurological: He is alert and oriented to person, place, and time. He exhibits normal muscle tone. Coordination normal.  Skin: Skin is warm. Laceration  noted.     Laceration to the anterior aspect of the left lower leg.  Bleeding controlled.  No edema.  Pt has full ROM of the ankle.  Distal sensation intact.  DP pulse brisk    ED Course  Procedures (including critical care time) Labs Review Labs Reviewed - No data to display Imaging Review No results found.   EKG Interpretation None     LACERATION REPAIR Performed by: Rosaire Cueto L. Authorized by: Hale Bogus Consent: Verbal consent obtained. Risks and benefits: risks, benefits and alternatives were discussed Consent given by: patient Patient identity confirmed: provided demographic data Prepped and Draped in normal sterile fashion Wound explored  Laceration Location: left lower leg Laceration Length: 3 cm  No Foreign Bodies seen or palpated  Anesthesia: local infiltration  Local anesthetic: lidocaine 1 % w/o epinephrine  Anesthetic total: 3 ml  Irrigation method: syringe Amount of cleaning: standard  Skin closure: 4-0 prolene Number of sutures: 4  Technique: simple interrupted Patient tolerance: Patient tolerated the procedure well with no immediate complications.  MDM   Final diagnoses:  Laceration   Wound(s) explored with adequate hemostasis through ROM, no apparent gross foreign body retained, no significant involvement of deep structures such as bone / joint / tendon / or neurovascular involvement noted.  Baseline Strength and Sensation to affected extremity(ies) with normal light touch for Pt, distal NVI with CR< 2 secs and pulse(s) intact to affected extremity(ies).   Pt agrees to wound care, Td updated, sutures out in 10 days, and to return here for any signs of infection     Leighla Chestnutt L. Menaal Russum, PA-C 10/15/13 1706

## 2013-10-17 NOTE — ED Provider Notes (Signed)
Medical screening examination/treatment/procedure(s) were performed by non-physician practitioner and as supervising physician I was immediately available for consultation/collaboration.   EKG Interpretation None       Nat Christen, MD 10/17/13 [phone]

## 2014-01-01 ENCOUNTER — Other Ambulatory Visit: Payer: Self-pay | Admitting: Interventional Cardiology

## 2014-01-29 ENCOUNTER — Ambulatory Visit (INDEPENDENT_AMBULATORY_CARE_PROVIDER_SITE_OTHER): Payer: Medicare Other | Admitting: Interventional Cardiology

## 2014-01-29 ENCOUNTER — Encounter: Payer: Self-pay | Admitting: Interventional Cardiology

## 2014-01-29 VITALS — BP 111/73 | HR 66 | Ht 69.0 in | Wt 194.0 lb

## 2014-01-29 DIAGNOSIS — E785 Hyperlipidemia, unspecified: Secondary | ICD-10-CM

## 2014-01-29 DIAGNOSIS — I25119 Atherosclerotic heart disease of native coronary artery with unspecified angina pectoris: Secondary | ICD-10-CM

## 2014-01-29 DIAGNOSIS — I10 Essential (primary) hypertension: Secondary | ICD-10-CM

## 2014-01-29 DIAGNOSIS — I209 Angina pectoris, unspecified: Secondary | ICD-10-CM

## 2014-01-29 DIAGNOSIS — I2510 Atherosclerotic heart disease of native coronary artery without angina pectoris: Secondary | ICD-10-CM

## 2014-01-29 MED ORDER — ROSUVASTATIN CALCIUM 20 MG PO TABS: ORAL_TABLET | ORAL | Status: DC

## 2014-01-29 NOTE — Progress Notes (Signed)
Patient ID: WAYDE GOPAUL, male   DOB: [birth year], 63 y.o.   MRN: [id number]    [address], [address] [place], [place]  [address] Phone: [phone] Fax:  [phone]  Date:  01/29/2014   ID:  MICAIAH LITLE, DOB 1950/11/11, MRN [id number]  PCP:  Mathews Argyle, MD   ASSESSMENT:  1. Coronary disease with angina, stable 2. Hyperlipidemia 3. Hypertension  PLAN:  1. Clinical followup in one year 2. Maintain an active lifestyle 3. Change lipid prescription to 90 day supply   SUBJECTIVE: IZELL LABAT is a 63 y.o. male is doing well. No significant episodes of angina. He has lost weight. Physical activity is stable. He does not feel he has limitations.   Wt Readings from Last 3 Encounters:  01/29/14 194 lb (87.998 kg)  10/13/13 195 lb (88.451 kg)  05/27/13 200 lb 4.8 oz (90.855 kg)     Past Medical History  Diagnosis Date  . Hypertension   . History of meniscal tear   . GERD (gastroesophageal reflux disease)   . Depression   . CAD (coronary artery disease)     with high grade obstruction second obtuse marginal and diffuse LAD and circumflex disease by cath 2010  . Erectile dysfunction   . Elevated cholesterol     LDL Goal < 70  . History of nuclear stress test 5/10    no ischemia  . Colon polyp   . Anginal pain     nitroglycerin last used - early November  . Anxiety     claustrophobic  . Cancer     skin cancer, on R ear - basal cell   . Arthritis     throughout arms, knees & back & neck    Current Outpatient Prescriptions  Medication Sig Dispense Refill  . ALPRAZolam (XANAX) 0.5 MG tablet Take 0.5 mg by mouth at bedtime as needed for anxiety.      Marland Kitchen aspirin 325 MG tablet Take 325 mg by mouth at bedtime.       . Coenzyme Q10 (COQ10) 100 MG CAPS Take 1 capsule by mouth daily.       . CRESTOR 20 MG tablet Take 1 tablet by mouth once a day  30 tablet  0  . HYDROcodone-acetaminophen (NORCO/VICODIN) 5-325 MG per tablet Take 1 tablet by mouth every 8 (eight)  hours.      Marland Kitchen losartan (COZAAR) 100 MG tablet Take 50 mg by mouth every evening.       . Multiple Vitamins-Minerals (MULTIVITAMINS THER. W/MINERALS) TABS Take 1 tablet by mouth daily.       . nitroGLYCERIN (NITROSTAT) 0.4 MG SL tablet Place 0.4 mg under the tongue every 5 (five) minutes as needed for chest pain.      . pantoprazole (PROTONIX) 40 MG tablet Take 40 mg by mouth daily as needed (for acid reflux).       Marland Kitchen RANEXA 500 MG 12 hr tablet Take 1 tablet by mouth  daily  90 tablet  1   No current facility-administered medications for this visit.    Allergies:   No Known Allergies  Social History:  The patient  reports that he quit smoking about 15 years ago. He quit smokeless tobacco use about 15 years ago. He reports that he does not drink alcohol or use illicit drugs.   ROS:  Please see the history of present illness.   No palpitations, transient neurological symptoms, swelling, orthopnea, PND, or other complaints  All other systems reviewed and negative.   OBJECTIVE: VS:  BP 111/73  Pulse 66  Ht 5\' 9"  (1.753 m)  Wt 194 lb (87.998 kg)  BMI 28.64 kg/m2 Well nourished, well developed, in no acute distress, younger than stated age and weight loss HEENT: normal Neck: JVD flat. Carotid bruit absent  Cardiac:  normal S1, S2; RRR; no murmur Lungs:  clear to auscultation bilaterally, no wheezing, rhonchi or rales Abd: soft, nontender, no hepatomegaly Ext: Edema absent. Pulses 2+ Skin: warm and dry Neuro:  CNs 2-12 intact, no focal abnormalities noted  EKG:  Not performed       Signed, Illene Labrador III, MD 01/29/2014 9:14 AM

## 2014-01-29 NOTE — Patient Instructions (Signed)
Your physician recommends that you continue on your current medications as directed. Please refer to the Current Medication list given to you today.  A refill for Crestor 20mg  daily 90 day supply  has been sent to your pharmacy  Your physician wants you to follow-up in: 1 year You will receive a reminder letter in the mail two months in advance. If you don't receive a letter, please call our office to schedule the follow-up appointment.

## 2014-04-06 ENCOUNTER — Other Ambulatory Visit: Payer: Self-pay | Admitting: Gastroenterology

## 2014-04-23 ENCOUNTER — Encounter (HOSPITAL_COMMUNITY): Payer: Self-pay | Admitting: Pharmacy Technician

## 2014-05-01 ENCOUNTER — Encounter (HOSPITAL_COMMUNITY): Payer: Self-pay | Admitting: *Deleted

## 2014-05-12 NOTE — Anesthesia Preprocedure Evaluation (Signed)
Anesthesia Evaluation  Patient identified by MRN, date of birth, ID band Patient awake    Reviewed: Allergy & Precautions, H&P , NPO status , Patient's Chart, lab work & pertinent test results  History of Anesthesia Complications Negative for: history of anesthetic complications  Airway Mallampati: II  TM Distance: >3 FB Neck ROM: Full    Dental no notable dental hx.    Pulmonary former smoker,  breath sounds clear to auscultation  Pulmonary exam normal       Cardiovascular hypertension, Pt. on medications + angina + CAD Rhythm:Regular Rate:Normal     Neuro/Psych PSYCHIATRIC DISORDERS Anxiety Depression negative neurological ROS     GI/Hepatic Neg liver ROS, GERD-  Medicated and Controlled,  Endo/Other  negative endocrine ROS  Renal/GU negative Renal ROS  negative genitourinary   Musculoskeletal  (+) Arthritis -, Osteoarthritis,    Abdominal   Peds negative pediatric ROS (+)  Hematology negative hematology ROS (+)   Anesthesia Other Findings   Reproductive/Obstetrics negative OB ROS                             Anesthesia Physical Anesthesia Plan  ASA: III  Anesthesia Plan: MAC   Post-op Pain Management:    Induction: Intravenous  Airway Management Planned: Nasal Cannula  Additional Equipment:   Intra-op Plan:   Post-operative Plan: Extubation in OR  Informed Consent: I have reviewed the patients History and Physical, chart, labs and discussed the procedure including the risks, benefits and alternatives for the proposed anesthesia with the patient or authorized representative who has indicated his/her understanding and acceptance.   Dental advisory given  Plan Discussed with: CRNA  Anesthesia Plan Comments:         Anesthesia Quick Evaluation

## 2014-05-14 ENCOUNTER — Ambulatory Visit (HOSPITAL_COMMUNITY): Payer: Medicare Other | Admitting: Anesthesiology

## 2014-05-14 ENCOUNTER — Encounter (HOSPITAL_COMMUNITY)
Admission: RE | Disposition: A | Discharge status: Home / Self Care | Payer: Self-pay | Source: Ambulatory Visit | Attending: Gastroenterology

## 2014-05-14 ENCOUNTER — Ambulatory Visit (HOSPITAL_COMMUNITY)
Admission: RE | Admit: 2014-05-14 | Discharge: 2014-05-14 | Disposition: A | Discharge status: Home / Self Care | Payer: Medicare Other | Source: Ambulatory Visit | Attending: Gastroenterology | Admitting: Gastroenterology

## 2014-05-14 ENCOUNTER — Encounter (HOSPITAL_COMMUNITY): Payer: Self-pay | Admitting: *Deleted

## 2014-05-14 DIAGNOSIS — F419 Anxiety disorder, unspecified: Secondary | ICD-10-CM | POA: Insufficient documentation

## 2014-05-14 DIAGNOSIS — Z87891 Personal history of nicotine dependence: Secondary | ICD-10-CM | POA: Insufficient documentation

## 2014-05-14 DIAGNOSIS — F329 Major depressive disorder, single episode, unspecified: Secondary | ICD-10-CM | POA: Insufficient documentation

## 2014-05-14 DIAGNOSIS — K219 Gastro-esophageal reflux disease without esophagitis: Secondary | ICD-10-CM | POA: Insufficient documentation

## 2014-05-14 DIAGNOSIS — D120 Benign neoplasm of cecum: Secondary | ICD-10-CM | POA: Diagnosis not present

## 2014-05-14 DIAGNOSIS — Z1211 Encounter for screening for malignant neoplasm of colon: Secondary | ICD-10-CM | POA: Insufficient documentation

## 2014-05-14 DIAGNOSIS — I10 Essential (primary) hypertension: Secondary | ICD-10-CM | POA: Diagnosis not present

## 2014-05-14 DIAGNOSIS — I2510 Atherosclerotic heart disease of native coronary artery without angina pectoris: Secondary | ICD-10-CM | POA: Diagnosis not present

## 2014-05-14 DIAGNOSIS — M1990 Unspecified osteoarthritis, unspecified site: Secondary | ICD-10-CM | POA: Insufficient documentation

## 2014-05-14 HISTORY — PX: COLONOSCOPY WITH PROPOFOL: SHX5780

## 2014-05-14 SURGERY — COLONOSCOPY WITH PROPOFOL
Anesthesia: Monitor Anesthesia Care

## 2014-05-14 MED ORDER — ONDANSETRON HCL 4 MG/2ML IJ SOLN
INTRAMUSCULAR | Status: AC
  Filled 2014-05-14: qty 2

## 2014-05-14 MED ORDER — ONDANSETRON HCL 4 MG/2ML IJ SOLN
INTRAMUSCULAR | Status: DC | PRN
  Administered 2014-05-14: 4 mg via INTRAVENOUS

## 2014-05-14 MED ORDER — PROPOFOL 10 MG/ML IV BOLUS
INTRAVENOUS | Status: DC | PRN
  Administered 2014-05-14: 20 mg via INTRAVENOUS
  Administered 2014-05-14: 30 mg via INTRAVENOUS

## 2014-05-14 MED ORDER — LIDOCAINE HCL (CARDIAC) 20 MG/ML IV SOLN
INTRAVENOUS | Status: AC
  Filled 2014-05-14: qty 5

## 2014-05-14 MED ORDER — PROPOFOL 10 MG/ML IV BOLUS
INTRAVENOUS | Status: AC
  Filled 2014-05-14: qty 20

## 2014-05-14 MED ORDER — PROPOFOL INFUSION 10 MG/ML OPTIME
INTRAVENOUS | Status: DC | PRN
  Administered 2014-05-14: 75 ug/kg/min via INTRAVENOUS

## 2014-05-14 MED ORDER — LIDOCAINE HCL (CARDIAC) 20 MG/ML IV SOLN
INTRAVENOUS | Status: DC | PRN
  Administered 2014-05-14: 90 mg via INTRAVENOUS

## 2014-05-14 MED ORDER — PROPOFOL 10 MG/ML IV BOLUS
INTRAVENOUS | Status: AC
Start: 2014-05-14 — End: 2014-05-14
  Filled 2014-05-14: qty 20

## 2014-05-14 MED ORDER — LACTATED RINGERS IV SOLN
INTRAVENOUS | Status: DC | PRN
  Administered 2014-05-14: 13:00:00 via INTRAVENOUS

## 2014-05-14 SURGICAL SUPPLY — 22 items

## 2014-05-14 NOTE — Discharge Instructions (Signed)
Colonoscopy, Care After °These instructions give you information on caring for yourself after your procedure. Your doctor may also give you more specific instructions. Call your doctor if you have any problems or questions after your procedure. °HOME CARE °· Do not drive for 24 hours. °· Do not sign important papers or use machinery for 24 hours. °· You may shower. °· You may go back to your usual activities, but go slower for the first 24 hours. °· Take rest breaks often during the first 24 hours. °· Walk around or use warm packs on your belly (abdomen) if you have belly cramping or gas. °· Drink enough fluids to keep your pee (urine) clear or pale yellow. °· Resume your normal diet. Avoid heavy or fried foods. °· Avoid drinking alcohol for 24 hours or as told by your doctor. °· Only take medicines as told by your doctor. °If a tissue sample (biopsy) was taken during the procedure:  °· Do not take aspirin or blood thinners for 7 days, or as told by your doctor. °· Do not drink alcohol for 7 days, or as told by your doctor. °· Eat soft foods for the first 24 hours. °GET HELP IF: °You still have a small amount of blood in your poop (stool) 2-3 days after the procedure. °GET HELP RIGHT AWAY IF: °· You have more than a small amount of blood in your poop. °· You see clumps of tissue (blood clots) in your poop. °· Your belly is puffy (swollen). °· You feel sick to your stomach (nauseous) or throw up (vomit). °· You have a fever. °· You have belly pain that gets worse and medicine does not help. °MAKE SURE YOU: °· Understand these instructions. °· Will watch your condition. °· Will get help right away if you are not doing well or get worse. °Document Released: 07/29/2010 Document Revised: 07/01/2013 Document Reviewed: 03/03/2013 °ExitCare® Patient Information ©2015 [employer]. This information is not intended to replace advice given to you by your health care provider. Make sure you discuss any questions you have with  your health care provider. ° °

## 2014-05-14 NOTE — H&P (Signed)
  Procedure: Surveillance colonoscopy. Colonoscopy with removal of a small tubular adenomatous polyp in 2005. Normal surveillance colonoscopy performed in 2010.  History: The patient is a 63 year old male born April 01, 1951. He is scheduled to undergo a surveillance colonoscopy with polypectomy to prevent colon cancer.  Medication allergies: None  Past medical history: Cervical spine surgery. Arthroscopic right knee surgery. Right shoulder surgery. Coronary artery disease. Hypertension. Hypercholesterolemia. Gastroesophageal reflux. Depression.  Exam: The patient is alert and lying comfortably on the endoscopy stretcher. Lungs are clear to auscultation. Cardiac exam reveals a regular rhythm. Abdomen is soft and nontender to palpation.  Plan: Proceed with surveillance colonoscopy

## 2014-05-14 NOTE — Anesthesia Postprocedure Evaluation (Signed)
  Anesthesia Post-op Note  Patient: Edward [name]  Procedure(s) Performed: Procedure(s) (LRB): COLONOSCOPY WITH PROPOFOL (N/A)  Patient Location: PACU  Anesthesia Type: MAC  Level of Consciousness: awake and alert   Airway and Oxygen Therapy: Patient Spontanous Breathing  Post-op Pain: mild  Post-op Assessment: Post-op Vital signs reviewed, Patient's Cardiovascular Status Stable, Respiratory Function Stable, Patent Airway and No signs of Nausea or vomiting  Last Vitals:  Filed Vitals:   05/14/14 1400  BP: 165/65  Pulse: 58  Temp: 36.7 C  Resp: 19    Post-op Vital Signs: stable   Complications: No apparent anesthesia complications

## 2014-05-14 NOTE — Op Note (Signed)
Procedure: Surveillance colonoscopy. Colonoscopy with removal of a small tubular adenomatous polyp performed in 2005. Normal surveillance colonoscopy performed in 2010.  Endoscopist: Earle Gell  Premedication: Propofol administered by anesthesia  Procedure: The patient was placed in the left lateral decubitus position. Anal inspection and digital rectal exam were normal. The Pentax pediatric colonoscope was introduced into the rectum at 1:24 PM and advanced to the cecum at 1:32 PM. A normal-appearing appendiceal orifice was identified. A normal-appearing ileocecal valve was identified. Colonic preparation for the exam today was good. The procedure ended at 1:49 PM. Withdrawal time was 17 minutes.  Rectum. Normal. Retroflexed view of the distal rectum normal  Sigmoid colon and descending colon. Normal  Splenic flexure. Normal  Transverse colon. Normal  Hepatic flexure. Normal  Ascending colon. Normal  Cecum and ileocecal valve. From the proximal cecum a 6 mm sessile polyp was removed with the cold snare and cold biopsy forceps.  Assessment: A 6 mm sessile polyp was removed from the proximal cecum with the cold snare; otherwise normal colonoscopy.  Recommendation: If the cecal polyp returns neoplastic pathologically, the patient should undergo a surveillance colonoscopy in 5 years. If the polyp returns nonneoplastic pathologically, he should undergo a surveillance colonoscopy in 10 years

## 2014-05-14 NOTE — Transfer of Care (Signed)
Immediate Anesthesia Transfer of Care Note  Patient: Edward [name]  Procedure(s) Performed: Procedure(s) (LRB): COLONOSCOPY WITH PROPOFOL (N/A)  Patient Location: endoscopy  Anesthesia Type: MAC  Level of Consciousness: awake, patient cooperative and responds to stimulation  Airway & Oxygen Therapy: Patient Spontanous Breathing and Patient connected to face mask oxgen  Post-op Assessment: Report given to endoscpy RN and Post -op Vital signs reviewed and stable  Post vital signs: Reviewed and stable  Complications: No apparent anesthesia complications

## 2014-05-15 ENCOUNTER — Encounter (HOSPITAL_COMMUNITY): Payer: Self-pay | Admitting: Gastroenterology

## 2014-06-17 ENCOUNTER — Ambulatory Visit (HOSPITAL_COMMUNITY)
Admission: RE | Admit: 2014-06-17 | Discharge: 2014-06-17 | Disposition: A | Discharge status: Home / Self Care | Payer: Medicare Other | Source: Ambulatory Visit | Attending: Orthopedic Surgery | Admitting: Orthopedic Surgery

## 2014-06-17 ENCOUNTER — Other Ambulatory Visit (HOSPITAL_COMMUNITY): Payer: Self-pay | Admitting: Orthopedic Surgery

## 2014-06-17 DIAGNOSIS — M25532 Pain in left wrist: Secondary | ICD-10-CM

## 2015-02-01 ENCOUNTER — Other Ambulatory Visit: Payer: Self-pay | Admitting: Interventional Cardiology

## 2015-02-02 ENCOUNTER — Other Ambulatory Visit: Payer: Self-pay

## 2015-02-02 MED ORDER — ROSUVASTATIN CALCIUM 20 MG PO TABS: 20.0000 mg | ORAL_TABLET | Freq: Every day | ORAL | Status: DC

## 2015-02-07 NOTE — Progress Notes (Signed)
Cardiology Office Note   Date:  02/08/2015   ID:  PEREZ DIRICO, DOB [birth year], MRN [id number]  PCP:  Mathews Argyle, MD  Cardiologist:  [name], MD   Chief Complaint  Patient presents with  . Coronary Artery Disease      History of Present Illness: Edward [name] is a 64 y.o. male who presents for coronary artery disease, gastroesophageal reflux, hyperlipidemia, and hypertension.  Patient is doing well. Nitroglycerin use is rare. He needs a more economic statin therapy.  Past Medical History  Diagnosis Date  . Hypertension   . History of meniscal tear   . GERD (gastroesophageal reflux disease)   . Depression   . CAD (coronary artery disease)     with high grade obstruction second obtuse marginal and diffuse LAD and circumflex disease by cath 2010  . Erectile dysfunction   . Elevated cholesterol     LDL Goal < 70  . History of nuclear stress test 5/10    no ischemia  . Colon polyp   . Anginal pain     nitroglycerin last used - early November  . Anxiety     claustrophobic  . Cancer     skin cancer, on R ear - basal cell   . Arthritis     throughout arms, knees & back & neck    Past Surgical History  Procedure Laterality Date  . Shoulder surgery      x3 left / x1 right  . Back surgery    . Cervical fusion    . Knee arthroscopy  06/13/2011    Procedure: ARTHROSCOPY KNEE;  Surgeon: Tobi Bastos;  Location: [facility];  Service: Orthopedics;  Laterality: Right;  Right Knee Arthroscopy with Medial Menisectomy  . Cardiac catheterization      2004 & 2/10, normal LV function  . Finger fracture surgery      by Dr. Daylene Katayama  . Anterior cervical decomp/discectomy fusion N/A 05/29/2013    Procedure: Cervical five-six Anterior cervical decompression/diskectomy/fusion with exploration of Cervical six-seven, hardware removal ;  Surgeon: Erline Levine, MD;  Location: [facility];  Service: Neurosurgery;  Laterality: N/A;  Cervical five-six Anterior cervical  decompression/diskectomy/fusion with exploration of Cervical six-seven, Hardware removal   . Neck surgery  05/29/2013  . Skin surgery      nose and right ear skin cancer surgery  . Skin cancer excision    . Colonoscopy with propofol N/A 05/14/2014    Procedure: COLONOSCOPY WITH PROPOFOL;  Surgeon: Garlan Fair, MD;  Location: [facility];  Service: Endoscopy;  Laterality: N/A;     Current Outpatient Prescriptions  Medication Sig Dispense Refill  . ALPRAZolam (XANAX) 0.5 MG tablet Take 0.5 mg by mouth at bedtime as needed for anxiety.    Marland Kitchen aspirin 325 MG tablet Take 325 mg by mouth at bedtime.     . dimenhyDRINATE (DRAMAMINE) 50 MG tablet Take 50 mg by mouth daily as needed for nausea.    Marland Kitchen HYDROcodone-acetaminophen (NORCO/VICODIN) 5-325 MG per tablet Take 1 tablet by mouth every 8 (eight) hours.    Marland Kitchen losartan (COZAAR) 100 MG tablet Take 50 mg by mouth every evening.     . nitroGLYCERIN (NITROSTAT) 0.4 MG SL tablet Place 0.4 mg under the tongue every 5 (five) minutes as needed for chest pain.    . pantoprazole (PROTONIX) 40 MG tablet Take 40 mg by mouth daily as needed (for acid reflux).     Marland Kitchen atorvastatin (LIPITOR) 40  MG tablet Take 1 tablet (40 mg total) by mouth daily. 90 tablet 3   No current facility-administered medications for this visit.    Allergies:   Review of patient's allergies indicates no known allergies.    Social History:  The patient  reports that he quit smoking about 16 years ago. He quit smokeless tobacco use about 16 years ago. He reports that he does not drink alcohol or use illicit drugs.   Family History:  The patient's family history includes CAD in his sister and sister; CVA in his sister; Heart attack in his mother and sister.    ROS:  Please see the history of present illness.   Otherwise, review of systems are positive for low back discomfort, decreased hearing, easy bruising, dizziness, and occasional chest pressure..   All other systems are reviewed  and negative.    PHYSICAL EXAM: VS:  BP 104/64 mmHg  Pulse 81  Ht 5\' 10"  (1.778 m)  Wt 88.633 kg (195 lb 6.4 oz)  BMI 28.04 kg/m2  SpO2 98% , BMI Body mass index is 28.04 kg/(m^2). GEN: Well nourished, well developed, in no acute distress HEENT: normal Neck: no JVD, carotid bruits, or masses Cardiac: RRR; no murmurs, rubs, or gallops,no edema  Respiratory:  clear to auscultation bilaterally, normal work of breathing GI: soft, nontender, nondistended, + BS MS: no deformity or atrophy Skin: warm and dry, no rash Neuro:  Strength and sensation are intact Psych: euthymic mood, full affect   EKG:  EKG is ordered today. The ekg ordered today demonstrates normal   Recent Labs: No results found for requested labs within last 365 days.    Lipid Panel    Component Value Date/Time   CHOL  11/21/2008 0620    143        ATP III CLASSIFICATION:  <200     mg/dL   Desirable  200-239  mg/dL   Borderline High  >=240    mg/dL   High          TRIG 30 11/21/2008 0620   HDL 53 11/21/2008 0620   CHOLHDL 2.7 11/21/2008 0620   VLDL 6 11/21/2008 0620   LDLCALC  11/21/2008 0620    84        Total Cholesterol/HDL:CHD Risk Coronary Heart Disease Risk Table                     Men   Women  1/2 Average Risk   3.4   3.3  Average Risk       5.0   4.4  2 X Average Risk   9.6   7.1  3 X Average Risk  23.4   11.0        Use the calculated Patient Ratio above and the CHD Risk Table to determine the patient's CHD Risk.        ATP III CLASSIFICATION (LDL):  <100     mg/dL   Optimal  100-129  mg/dL   Near or Above                    Optimal  130-159  mg/dL   Borderline  160-189  mg/dL   High  >190     mg/dL   Very High      Wt Readings from Last 3 Encounters:  02/08/15 88.633 kg (195 lb 6.4 oz)  05/14/14 88.451 kg (195 lb)  01/29/14 87.998 kg (194 lb)      Other  studies Reviewed: Additional studies/ records that were reviewed today include: . Review of the above records  demonstrates:    ASSESSMENT AND PLAN:  1. Atherosclerosis of native coronary artery of native heart with angina pectoris  The patient is having minimal angina. He has not used nitroglycerin in over 6 months.  2. Essential hypertension, benign  Well controlled.  3. Hyperlipidemia  On therapy and followed by primary care and cardiology.    Current medicines are reviewed at length with the patient today.  The patient has concerns regarding medicines.  The following changes have been made:  He is unable to afford Crestor. He also switch to a more economical statin. We will change to atorvastatin 40 mg per day. 8 weeks after starting atorvastatin 80 liver and lipid panel will be done.  Labs/ tests ordered today include:   Orders Placed This Encounter  Procedures  . Lipid panel  . ALT  . EKG 12-Lead     Disposition:   FU with HS in 1 year  Signed, [name], MD  02/08/2015 10:34 AM    [facility] [address], [place], [place]  [address] Phone: [phone]; Fax: [phone]

## 2015-02-08 ENCOUNTER — Ambulatory Visit (INDEPENDENT_AMBULATORY_CARE_PROVIDER_SITE_OTHER): Payer: Medicare Other | Admitting: Interventional Cardiology

## 2015-02-08 ENCOUNTER — Encounter: Payer: Self-pay | Admitting: Interventional Cardiology

## 2015-02-08 VITALS — BP 104/64 | HR 81 | Ht 70.0 in | Wt 195.4 lb

## 2015-02-08 DIAGNOSIS — I25119 Atherosclerotic heart disease of native coronary artery with unspecified angina pectoris: Secondary | ICD-10-CM | POA: Diagnosis not present

## 2015-02-08 DIAGNOSIS — E785 Hyperlipidemia, unspecified: Secondary | ICD-10-CM

## 2015-02-08 DIAGNOSIS — I10 Essential (primary) hypertension: Secondary | ICD-10-CM

## 2015-02-08 MED ORDER — ATORVASTATIN CALCIUM 40 MG PO TABS: 40.0000 mg | ORAL_TABLET | Freq: Every day | ORAL | Status: DC

## 2015-02-08 NOTE — Patient Instructions (Signed)
Medication Instructions:  Your physician has recommended you make the following change in your medication:  1)STOP Crestor 2)START Atorvastatin (Lipitor) 40 mg daily. An Rx has been sent to your pharmacy  Labwork: Your physician recommends that you return for a FASTING lipid profile and alt in 5 months  Testing/Procedures: None   Follow-Up: Your physician wants you to follow-up in: 1 year with Dr.Smith You will receive a reminder letter in the mail two months in advance. If you don't receive a letter, please call our office to schedule the follow-up appointment.   Any Other Special Instructions Will Be Listed Below (If Applicable).

## 2015-03-09 ENCOUNTER — Other Ambulatory Visit: Payer: Self-pay | Admitting: Surgical

## 2015-03-16 ENCOUNTER — Telehealth: Payer: Self-pay

## 2015-03-16 NOTE — Telephone Encounter (Signed)
Cardiac clearance placed in nurse fax box in MR to be faxed to [facility]

## 2015-03-19 NOTE — Patient Instructions (Addendum)
YOUR PROCEDURE IS SCHEDULED ON :  03/30/15  REPORT TO [facility] MAIN ENTRANCE FOLLOW SIGNS TO EAST ELEVATOR - GO TO 3rd FLOOR CHECK IN AT 3 EAST NURSES STATION (SHORT STAY) AT:  10:00 AM  CALL THIS NUMBER IF YOU HAVE PROBLEMS THE MORNING OF SURGERY [phone]  REMEMBER:ONLY 1 PER PERSON MAY GO TO SHORT STAY WITH YOU TO GET READY THE MORNING OF YOUR SURGERY  DO NOT EAT FOOD OR DRINK LIQUIDS AFTER MIDNIGHT  TAKE THESE MEDICINES THE MORNING OF SURGERY: PANTOPRAZOLE  YOU MAY NOT HAVE ANY METAL ON YOUR BODY INCLUDING HAIR PINS AND PIERCING'S. DO NOT WEAR JEWELRY, MAKEUP, LOTIONS, POWDERS OR PERFUMES. DO NOT WEAR NAIL POLISH. DO NOT SHAVE 48 HRS PRIOR TO SURGERY. MEN MAY SHAVE FACE AND NECK.  DO NOT Edward [name]. Edward [name] IS NOT RESPONSIBLE FOR VALUABLES.  CONTACTS, DENTURES OR PARTIALS MAY NOT BE WORN TO SURGERY. LEAVE SUITCASE IN CAR. CAN BE BROUGHT TO ROOM AFTER SURGERY.  PATIENTS DISCHARGED THE DAY OF SURGERY WILL NOT BE ALLOWED TO DRIVE HOME.  PLEASE READ OVER THE FOLLOWING INSTRUCTION SHEETS _________________________________________________________________________________                                          Edward [name] - PREPARING FOR SURGERY  Before surgery, you can play an important role.  Because skin is not sterile, your skin needs to be as free of germs as possible.  You can reduce the number of germs on your skin by washing with CHG (chlorahexidine gluconate) soap before surgery.  CHG is an antiseptic cleaner which kills germs and bonds with the skin to continue killing germs even after washing. Please DO NOT use if you have an allergy to CHG or antibacterial soaps.  If your skin becomes reddened/irritated stop using the CHG and inform your nurse when you arrive at Short Stay. Do not shave (including legs and underarms) for at least 48 hours prior to the first CHG shower.  You may shave your face. Please follow these instructions  carefully:   1.  Shower with CHG Soap the night before surgery and the  morning of Surgery.   2.  If you choose to wash your hair, wash your hair first as usual with your  normal  Shampoo.   3.  After you shampoo, rinse your hair and body thoroughly to remove the  shampoo.                                         4.  Use CHG as you would any other liquid soap.  You can apply chg directly  to the skin and wash . Gently wash with scrungie or clean wascloth    5.  Apply the CHG Soap to your body ONLY FROM THE NECK DOWN.   Do not use on open                           Wound or open sores. Avoid contact with eyes, ears mouth and genitals (private parts).                        Genitals (private parts) with your normal soap.  6.  Wash thoroughly, paying special attention to the area where your surgery  will be performed.   7.  Thoroughly rinse your body with warm water from the neck down.   8.  DO NOT shower/wash with your normal soap after using and rinsing off  the CHG Soap .                9.  Pat yourself dry with a clean towel.             10.  Wear clean night clothes to bed after shower             11.  Place clean sheets on your bed the night of your first shower and do not  sleep with pets.  Day of Surgery : Do not apply any lotions/deodorants the morning of surgery.  Please wear clean clothes to the hospital/surgery center.  FAILURE TO FOLLOW THESE INSTRUCTIONS MAY RESULT IN THE CANCELLATION OF YOUR SURGERY    PATIENT SIGNATURE_________________________________  ______________________________________________________________________     Edward [name]  An incentive spirometer is a tool that can help keep your lungs clear and active. This tool measures how well you are filling your lungs with each breath. Taking long deep breaths may help reverse or decrease the chance of developing breathing (pulmonary) problems (especially infection) following:  A long  period of time when you are unable to move or be active. BEFORE THE PROCEDURE   If the spirometer includes an indicator to show your best effort, your nurse or respiratory therapist will set it to a desired goal.  If possible, sit up straight or lean slightly forward. Try not to slouch.  Hold the incentive spirometer in an upright position. INSTRUCTIONS FOR USE   Sit on the edge of your bed if possible, or sit up as far as you can in bed or on a chair.  Hold the incentive spirometer in an upright position.  Breathe out normally.  Place the mouthpiece in your mouth and seal your lips tightly around it.  Breathe in slowly and as deeply as possible, raising the piston or the ball toward the top of the column.  Hold your breath for 3-5 seconds or for as long as possible. Allow the piston or ball to fall to the bottom of the column.  Remove the mouthpiece from your mouth and breathe out normally.  Rest for a few seconds and repeat Steps 1 through 7 at least 10 times every 1-2 hours when you are awake. Take your time and take a few normal breaths between deep breaths.  The spirometer may include an indicator to show your best effort. Use the indicator as a goal to work toward during each repetition.  After each set of 10 deep breaths, practice coughing to be sure your lungs are clear. If you have an incision (the cut made at the time of surgery), support your incision when coughing by placing a pillow or rolled up towels firmly against it. Once you are able to get out of bed, walk around indoors and cough well. You may stop using the incentive spirometer when instructed by your caregiver.  RISKS AND COMPLICATIONS  Take your time so you do not get dizzy or light-headed.  If you are in pain, you may need to take or ask for pain medication before doing incentive spirometry. It is harder to take a deep breath if you are having pain. AFTER USE  Rest and breathe slowly and easily.  It can  be helpful to keep track of a log of your progress. Your caregiver can provide you with a simple table to help with this. If you are using the spirometer at home, follow these instructions: Edward [name] IF:   You are having difficultly using the spirometer.  You have trouble using the spirometer as often as instructed.  Your pain medication is not giving enough relief while using the spirometer.  You develop fever of 100.5 F (38.1 C) or higher. SEEK IMMEDIATE MEDICAL CARE IF:   You cough up bloody sputum that had not been present before.  You develop fever of 102 F (38.9 C) or greater.  You develop worsening pain at or near the incision site. MAKE SURE YOU:   Understand these instructions.  Will watch your condition.  Will get help right away if you are not doing well or get worse. Document Released: 11/06/2006 Document Revised: 09/18/2011 Document Reviewed: 01/07/2007 Exit[employer] Patient Information 2014 [employer], [place].   ________________________________________________________________________  WHAT IS A BLOOD TRANSFUSION? Blood Transfusion Information  A transfusion is the replacement of blood or some of its parts. Blood is made up of multiple cells which provide different functions.  Red blood cells carry oxygen and are used for blood loss replacement.  White blood cells fight against infection.  Platelets control bleeding.  Plasma helps clot blood.  Other blood products are available for specialized needs, such as hemophilia or other clotting disorders. BEFORE THE TRANSFUSION  Who gives blood for transfusions?   Healthy volunteers who are fully evaluated to make sure their blood is safe. This is blood bank blood. Transfusion therapy is the safest it has ever been in the practice of medicine. Before blood is taken from a donor, a complete history is taken to make sure that person has no history of diseases nor engages in risky social behavior (examples are  intravenous drug use or sexual activity with multiple partners). The donor's travel history is screened to minimize risk of transmitting infections, such as malaria. The donated blood is tested for signs of infectious diseases, such as HIV and hepatitis. The blood is then tested to be sure it is compatible with you in order to minimize the chance of a transfusion reaction. If you or a relative donates blood, this is often done in anticipation of surgery and is not appropriate for emergency situations. It takes many days to process the donated blood. RISKS AND COMPLICATIONS Although transfusion therapy is very safe and saves many lives, the main dangers of transfusion include:   Getting an infectious disease.  Developing a transfusion reaction. This is an allergic reaction to something in the blood you were given. Every precaution is taken to prevent this. The decision to have a blood transfusion has been considered carefully by your caregiver before blood is given. Blood is not given unless the benefits outweigh the risks. AFTER THE TRANSFUSION  Right after receiving a blood transfusion, you will usually feel much better and more energetic. This is especially true if your red blood cells have gotten low (anemic). The transfusion raises the level of the red blood cells which carry oxygen, and this usually causes an energy increase.  The nurse administering the transfusion will monitor you carefully for complications. HOME CARE INSTRUCTIONS  No special instructions are needed after a transfusion. You may find your energy is better. Speak with your caregiver about any limitations on activity for underlying diseases you may have. SEEK MEDICAL CARE IF:   Your  condition is not improving after your transfusion.  You develop redness or irritation at the intravenous (IV) site. SEEK IMMEDIATE MEDICAL CARE IF:  Any of the following symptoms occur over the next 12 hours:  Shaking chills.  You have a  temperature by mouth above 102 F (38.9 C), not controlled by medicine.  Chest, back, or muscle pain.  People around you feel you are not acting correctly or are confused.  Shortness of breath or difficulty breathing.  Dizziness and fainting.  You get a rash or develop hives.  You have a decrease in urine output.  Your urine turns a dark color or changes to pink, red, or brown. Any of the following symptoms occur over the next 10 days:  You have a temperature by mouth above 102 F (38.9 C), not controlled by medicine.  Shortness of breath.  Weakness after normal activity.  The white part of the eye turns yellow (jaundice).  You have a decrease in the amount of urine or are urinating less often.  Your urine turns a dark color or changes to pink, red, or brown. Document Released: 06/23/2000 Document Revised: 09/18/2011 Document Reviewed: 02/10/2008 [facility] Patient Information 2014 [place], [place].  _______________________________________________________________________

## 2015-03-21 IMAGING — CR DG ORBITS FOR FOREIGN BODY
2 series · 2 of 2 positions shown · non-contrast
Comparison: Orbital films for foreign body on 03/24/2013

CLINICAL DATA: Left wrist pain, history of working with metal, pre
MRI

EXAM:
ORBITS FOR FOREIGN BODY - 2 VIEW

[w waters (1 of 2)]
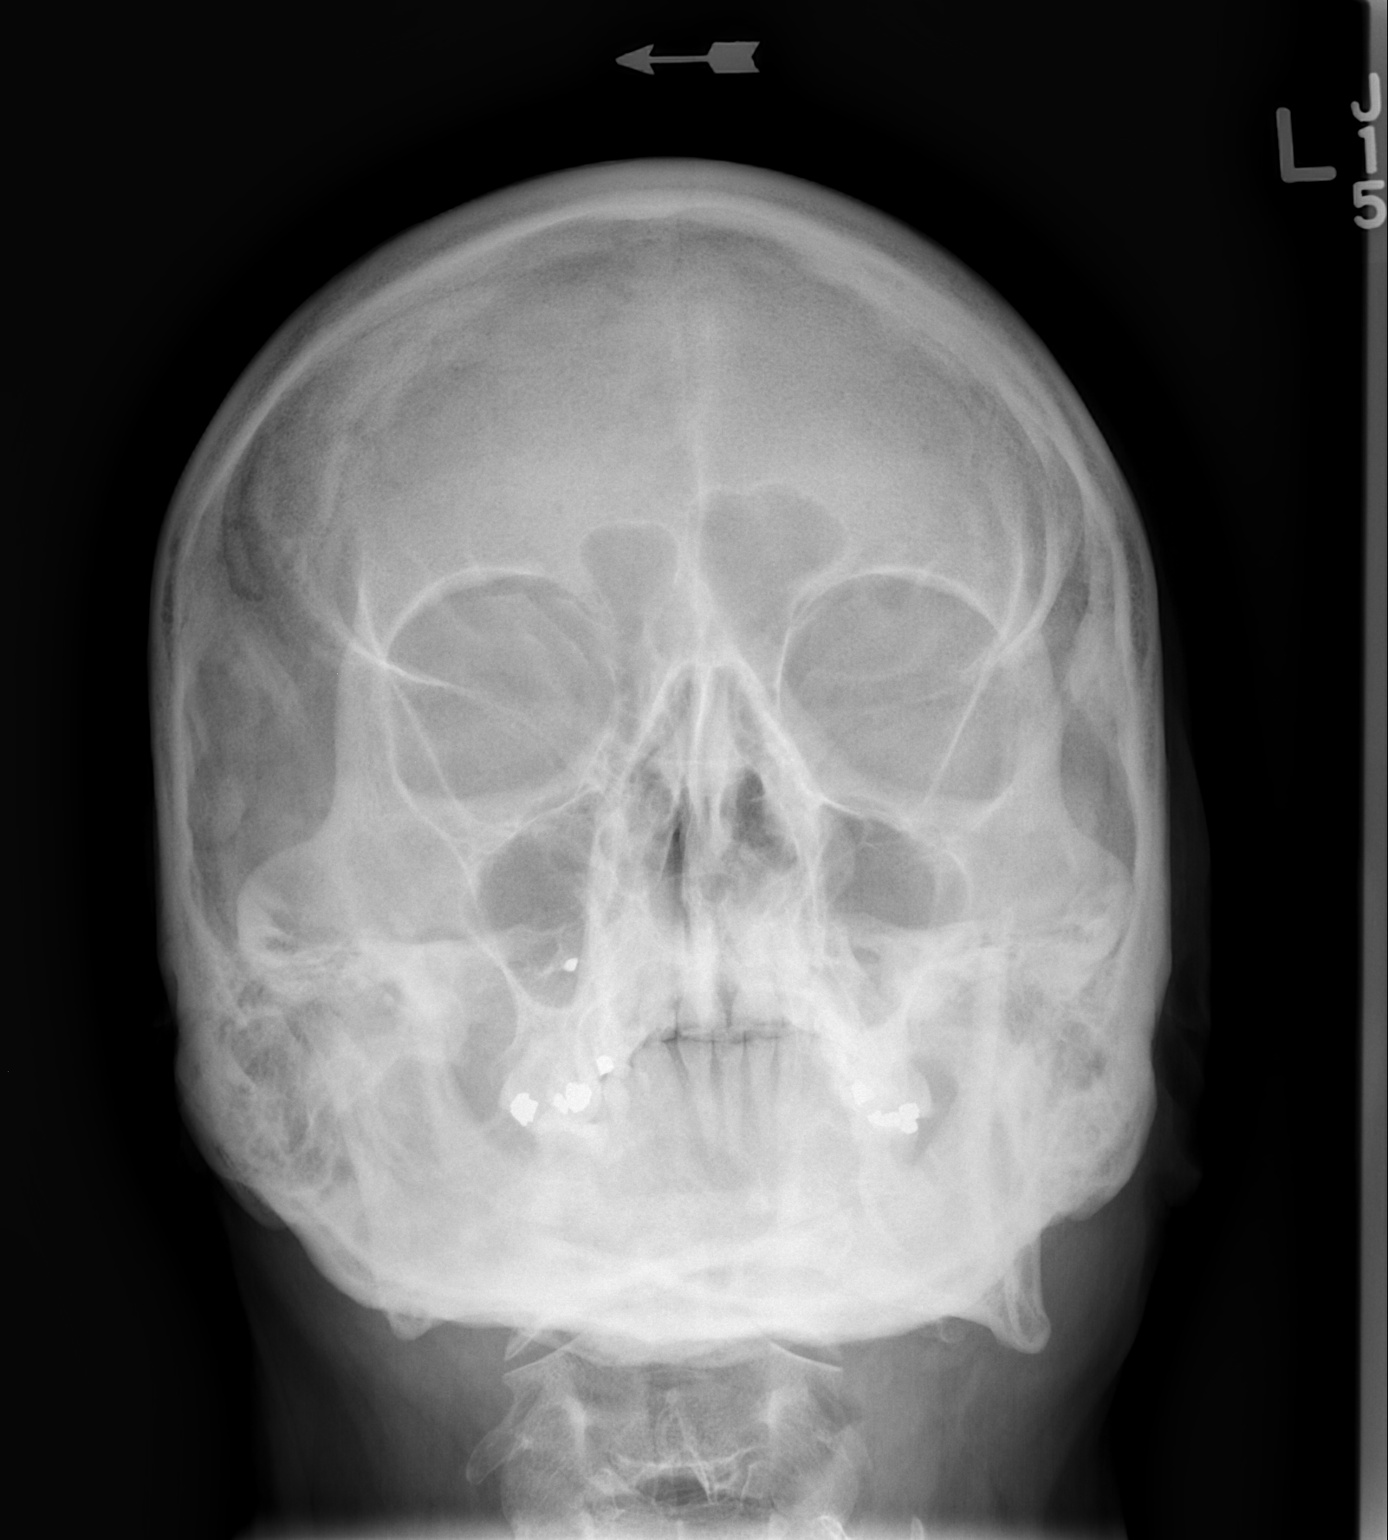

[w waters (2 of 2)]
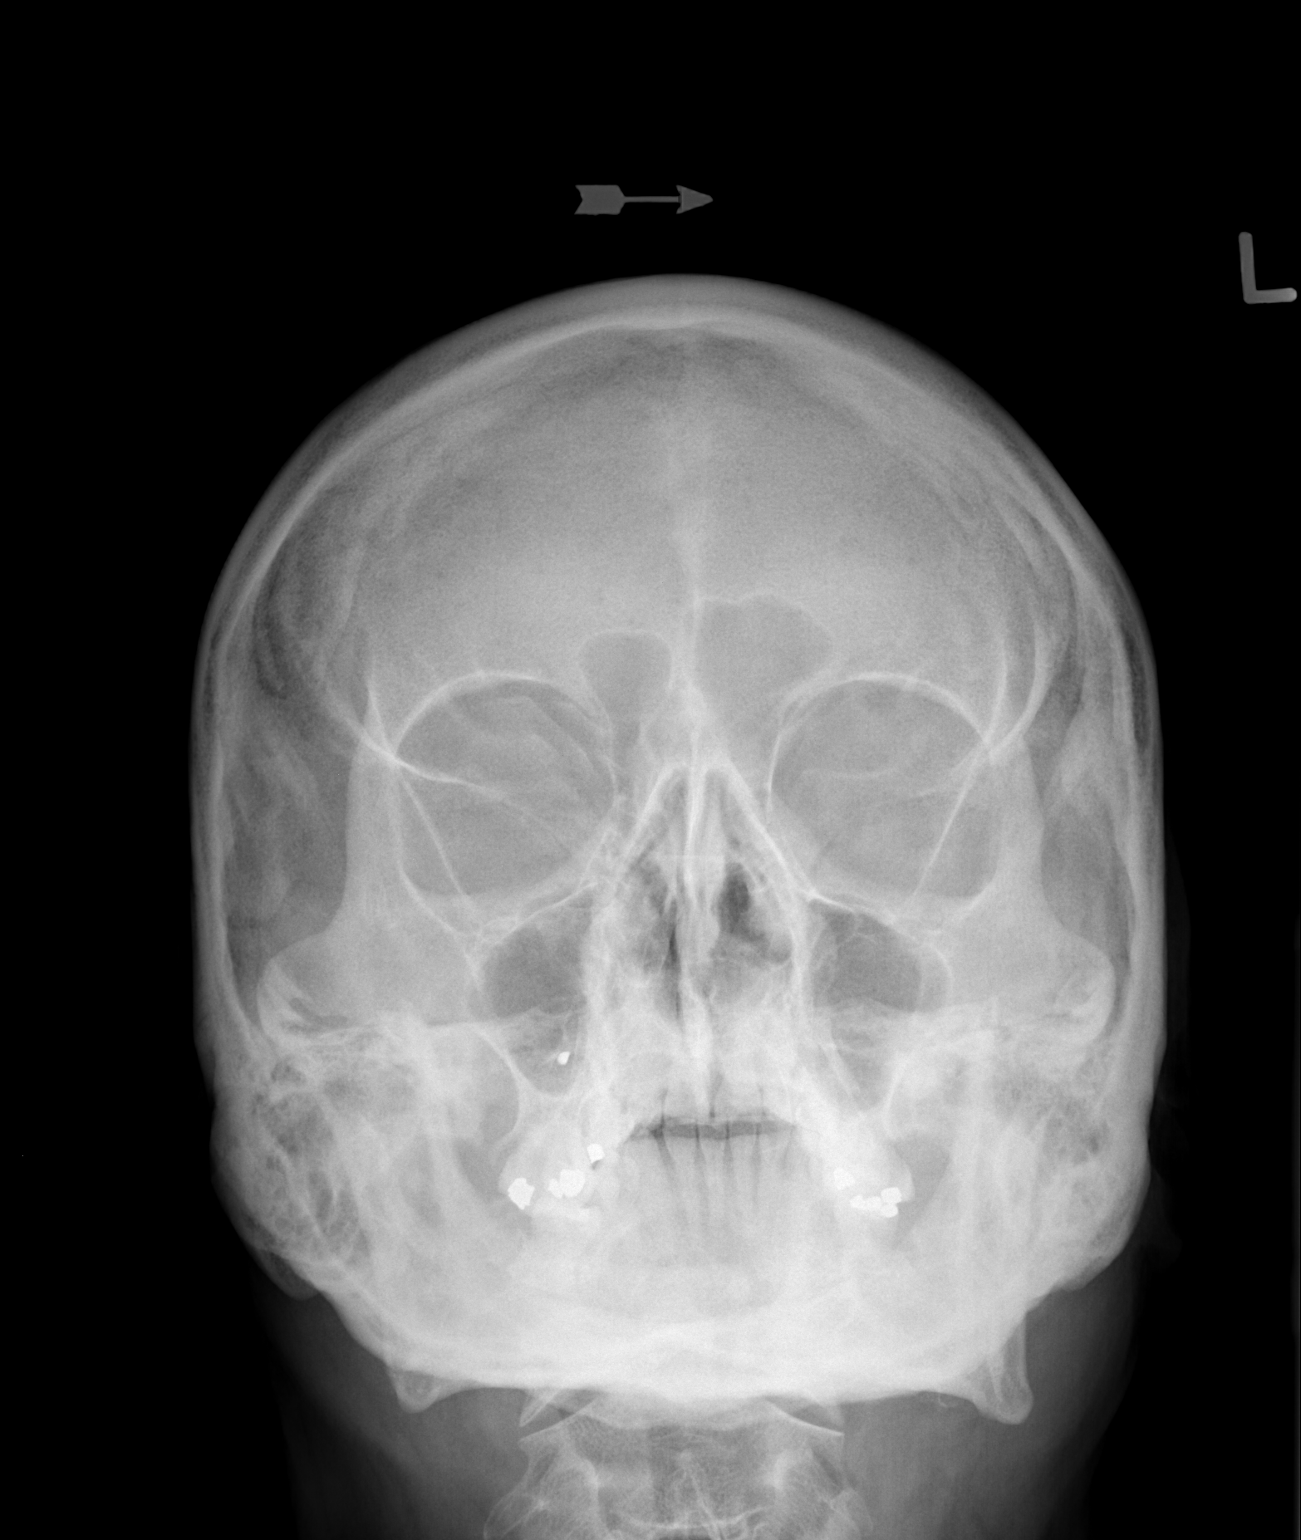

[2 of 2 positions shown; findings below may reference images not displayed]

FINDINGS: No orbital metallic foreign body is seen. Compared to the prior
orbital images, the small metallic foreign body overlying the right
maxillary sinus is unchanged in position. No bony abnormality is
seen.
IMPRESSION: No orbital metallic foreign body.

## 2015-03-23 ENCOUNTER — Encounter (HOSPITAL_COMMUNITY)
Admission: RE | Admit: 2015-03-23 | Discharge: 2015-03-23 | Disposition: A | Discharge status: Home / Self Care | Payer: Medicare Other | Source: Ambulatory Visit | Attending: Orthopedic Surgery | Admitting: Orthopedic Surgery

## 2015-03-23 ENCOUNTER — Encounter (INDEPENDENT_AMBULATORY_CARE_PROVIDER_SITE_OTHER): Payer: Self-pay

## 2015-03-23 ENCOUNTER — Ambulatory Visit (HOSPITAL_COMMUNITY)
Admission: RE | Admit: 2015-03-23 | Discharge: 2015-03-23 | Disposition: A | Discharge status: Home / Self Care | Payer: Medicare Other | Source: Ambulatory Visit | Attending: Surgical | Admitting: Surgical

## 2015-03-23 ENCOUNTER — Encounter (HOSPITAL_COMMUNITY): Payer: Self-pay

## 2015-03-23 DIAGNOSIS — Z01818 Encounter for other preprocedural examination: Secondary | ICD-10-CM | POA: Diagnosis present

## 2015-03-23 DIAGNOSIS — I10 Essential (primary) hypertension: Secondary | ICD-10-CM | POA: Insufficient documentation

## 2015-03-23 HISTORY — DX: Personal history of other malignant neoplasm of skin: Z85.828

## 2015-03-23 LAB — COMPREHENSIVE METABOLIC PANEL
ALT: 18 U/L (ref 17–63)
AST: 22 U/L (ref 15–41)
Albumin: 4.2 g/dL (ref 3.5–5.0)
Alkaline Phosphatase: 49 U/L (ref 38–126)
Anion gap: 5 (ref 5–15)
BUN: 14 mg/dL (ref 6–20)
CO2: 29 mmol/L (ref 22–32)
Calcium: 9.2 mg/dL (ref 8.9–10.3)
Chloride: 107 mmol/L (ref 101–111)
Creatinine, Ser: 0.73 mg/dL (ref 0.61–1.24)
GFR calc Af Amer: >60 mL/min (ref >60)
GFR calc non Af Amer: >60 mL/min (ref >60)
Glucose, Bld: 92 mg/dL (ref 65–99)
Potassium: 4.6 mmol/L (ref 3.5–5.1)
Sodium: 141 mmol/L (ref 135–145)
Total Bilirubin: 0.9 mg/dL (ref 0.3–1.2)
Total Protein: 6.6 g/dL (ref 6.5–8.1)

## 2015-03-23 LAB — PROTIME-INR
INR: 1.05 (ref 0.00–1.49)
Prothrombin Time: 13.9 seconds (ref 11.6–15.2)

## 2015-03-23 LAB — URINALYSIS, ROUTINE W REFLEX MICROSCOPIC
Bilirubin Urine: NEGATIVE
Glucose, UA: NEGATIVE mg/dL
Hgb urine dipstick: NEGATIVE
Ketones, ur: NEGATIVE mg/dL
Leukocytes, UA: NEGATIVE
Nitrite: NEGATIVE
Protein, ur: NEGATIVE mg/dL
Specific Gravity, Urine: 1.022 (ref 1.005–1.030)
Urobilinogen, UA: 1 mg/dL (ref 0.0–1.0)
pH: 5 (ref 5.0–8.0)

## 2015-03-23 LAB — CBC WITH DIFFERENTIAL/PLATELET
Basophils Absolute: 0 10*3/uL (ref 0.0–0.1)
Basophils Relative: 0 % (ref 0–1)
Eosinophils Absolute: 0.1 10*3/uL (ref 0.0–0.7)
Eosinophils Relative: 2 % (ref 0–5)
HCT: 38.7 % — ABNORMAL LOW (ref 39.0–52.0)
Hemoglobin: 13.3 g/dL (ref 13.0–17.0)
Lymphocytes Relative: 24 % (ref 12–46)
Lymphs Abs: 1.3 10*3/uL (ref 0.7–4.0)
MCH: 30.2 pg (ref 26.0–34.0)
MCHC: 34.4 g/dL (ref 30.0–36.0)
MCV: 88 fL (ref 78.0–100.0)
Monocytes Absolute: 0.5 10*3/uL (ref 0.1–1.0)
Monocytes Relative: 9 % (ref 3–12)
Neutro Abs: 3.5 10*3/uL (ref 1.7–7.7)
Neutrophils Relative %: 65 % (ref 43–77)
Platelets: 165 10*3/uL (ref 150–400)
RBC: 4.4 MIL/uL (ref 4.22–5.81)
RDW: 12.6 % (ref 11.5–15.5)
WBC: 5.4 10*3/uL (ref 4.0–10.5)

## 2015-03-23 LAB — ABO/RH: ABO/RH(D): O POS

## 2015-03-23 LAB — SURGICAL PCR SCREEN
MRSA, PCR: NEGATIVE
Staphylococcus aureus: NEGATIVE

## 2015-03-23 LAB — APTT: APTT: 26 s (ref 24–37)

## 2015-03-28 NOTE — H&P (Signed)
TOTAL KNEE ADMISSION H&P  Patient is being admitted for right total knee arthroplasty.  Subjective:  Chief Complaint:right knee pain.  HPI: Edward [name], 64 y.o. male, has a history of pain and functional disability in the right knee due to arthritis and has failed non-surgical conservative treatments for greater than 12 weeks to includeNSAID's and/or analgesics, corticosteriod injections and activity modification.  Onset of symptoms was gradual, starting 4 years ago with gradually worsening course since that time. The patient noted prior procedures on the knee to include  arthroscopy and menisectomy on the right knee(s).  Patient currently rates pain in the right knee(s) at 7 out of 10 with activity. Patient has night pain, worsening of pain with activity and weight bearing, pain that interferes with activities of daily living, pain with passive range of motion, crepitus and joint swelling.  Patient has evidence of periarticular osteophytes and joint space narrowing by imaging studies.  There is no active infection.  Patient Active Problem List   Diagnosis Date Noted  . Hyperlipidemia 01/29/2014  . Coronary atherosclerosis of native coronary artery 04/18/2013    Class: Chronic  . Essential hypertension, benign 04/18/2013    Class: Chronic  . Essential hypertension 04/18/2013  . Meniscus, medial, bucket handle tear, old 06/13/2011   Past Medical History  Diagnosis Date  . Hypertension   . CAD (coronary artery disease)     with high grade obstruction second obtuse marginal and diffuse LAD and circumflex disease by cath 2010  . Erectile dysfunction   . Elevated cholesterol     LDL Goal < 70  . History of nuclear stress test 5/10    no ischemia  . Colon polyp   . Anxiety     claustrophobic  . Cancer     skin cancer, on R ear - basal cell   . Arthritis     throughout arms, knees & back & neck  . Anginal pain     last NTG 3-4 months ago  . History of nonmelanoma skin cancer   .  GERD (gastroesophageal reflux disease)     has not had problem several months    Past Surgical History  Procedure Laterality Date  . Shoulder surgery      x3 left / x1 right  . Back surgery    . Cervical fusion    . Knee arthroscopy  06/13/2011    Procedure: ARTHROSCOPY KNEE;  Surgeon: Tobi Bastos;  Location: [facility];  Service: Orthopedics;  Laterality: Right;  Right Knee Arthroscopy with Medial Menisectomy  . Cardiac catheterization      2004 & 2/10, normal LV function  . Finger fracture surgery      by Dr. Daylene Katayama  . Anterior cervical decomp/discectomy fusion N/A 05/29/2013    Procedure: Cervical five-six Anterior cervical decompression/diskectomy/fusion with exploration of Cervical six-seven, hardware removal ;  Surgeon: Erline Levine, MD;  Location: [facility];  Service: Neurosurgery;  Laterality: N/A;  Cervical five-six Anterior cervical decompression/diskectomy/fusion with exploration of Cervical six-seven, Hardware removal   . Neck surgery  05/29/2013  . Skin surgery      nose and right ear skin cancer surgery  . Skin cancer excision    . Colonoscopy with propofol N/A 05/14/2014    Procedure: COLONOSCOPY WITH PROPOFOL;  Surgeon: Garlan Fair, MD;  Location: [facility];  Service: Endoscopy;  Laterality: N/A;      Current outpatient prescriptions:  .  ALPRAZolam (XANAX) 0.5 MG tablet, Take 0.5 mg by mouth  at bedtime as needed for anxiety., Disp: , Rfl:  .  aspirin 325 MG tablet, Take 325 mg by mouth at bedtime. , Disp: , Rfl:  .  CRESTOR 20 MG tablet, Take 20 mg by mouth every evening. , Disp: , Rfl:  .  HYDROcodone-acetaminophen (NORCO/VICODIN) 5-325 MG per tablet, Take 1 tablet by mouth every 8 (eight) hours., Disp: , Rfl:  .  losartan (COZAAR) 100 MG tablet, Take 50 mg by mouth every evening. , Disp: , Rfl:  .  nitroGLYCERIN (NITROSTAT) 0.4 MG SL tablet, Place 0.4 mg under the tongue every 5 (five) minutes as needed for chest pain., Disp: , Rfl:  .  pantoprazole  (PROTONIX) 40 MG tablet, Take 40 mg by mouth daily as needed (for acid reflux). , Disp: , Rfl:    No Known Allergies  Social History  Substance Use Topics  . Smoking status: Former Smoker    Quit date: 07/10/1998  . Smokeless tobacco: Former Systems developer    Quit date: 05/27/1998  . Alcohol Use: No    Family History  Problem Relation Age of Onset  . Heart attack Mother   . Heart attack Sister   . CAD Sister   . CAD Sister     stents  . CVA Sister      Review of Systems  Constitutional: Negative.   HENT: Positive for tinnitus. Negative for congestion, ear discharge, ear pain, hearing loss, nosebleeds and sore throat.   Eyes: Negative.   Respiratory: Negative.  Negative for stridor.   Cardiovascular: Positive for chest pain. Negative for palpitations, orthopnea, claudication, leg swelling and PND.       Hx of angina  Gastrointestinal: Negative.   Genitourinary: Negative.   Musculoskeletal: Positive for myalgias, back pain and joint pain. Negative for falls and neck pain.  Skin: Negative.   Neurological: Positive for headaches. Negative for dizziness, tingling, tremors, sensory change, speech change, focal weakness, seizures and loss of consciousness.  Endo/Heme/Allergies: Negative.   Psychiatric/Behavioral: Negative.     Objective:  Physical Exam  Constitutional: He is oriented to person, place, and time. He appears well-developed and well-nourished. No distress.  HENT:  Head: Normocephalic and atraumatic.  Right Ear: External ear normal.  Left Ear: External ear normal.  Nose: Nose normal.  Eyes: Conjunctivae and EOM are normal.  Neck: Normal range of motion. Neck supple.  Cardiovascular: Normal rate, regular rhythm, normal heart sounds and intact distal pulses.   No murmur heard. Respiratory: Effort normal and breath sounds normal. No respiratory distress. He has no wheezes.  GI: Soft. Bowel sounds are normal. He exhibits no distension. There is no tenderness.   Musculoskeletal:       Right hip: Normal.       Left hip: Normal.       Right knee: He exhibits decreased range of motion and swelling. He exhibits no effusion and no erythema. Tenderness found. Medial joint line and lateral joint line tenderness noted.       Left knee: Normal.  Neurological: He is alert and oriented to person, place, and time. He has normal strength and normal reflexes. No sensory deficit.  Skin: No rash noted. He is not diaphoretic. No erythema.  Psychiatric: He has a normal mood and affect. His behavior is normal.    Vitals Weight: 193.6 lb Height: 69in Body Surface Area: 2.04 m Body Mass Index: 28.59 kg/m  Pulse: 72 (Regular)  BP: 120/80 (Sitting, Left Arm, Standard)    Imaging Review Plain radiographs demonstrate  severe degenerative joint disease of the right knee(s). The overall alignment ismild varus. The bone quality appears to be good for age and reported activity level.  Assessment/Plan:  End stage arthritis, right knee   The patient history, physical examination, clinical judgment of the provider and imaging studies are consistent with end stage degenerative joint disease of the right knee(s) and total knee arthroplasty is deemed medically necessary. The treatment options including medical management, injection therapy arthroscopy and arthroplasty were discussed at length. The risks and benefits of total knee arthroplasty were presented and reviewed. The risks due to aseptic loosening, infection, stiffness, patella tracking problems, thromboembolic complications and other imponderables were discussed. The patient acknowledged the explanation, agreed to proceed with the plan and consent was signed. Patient is being admitted for inpatient treatment for surgery, pain control, PT, OT, prophylactic antibiotics, VTE prophylaxis, progressive ambulation and ADL's and discharge planning. The patient is planning to be discharged home with home health  services  PCP: Dr. Daneen Schick Allergic to Percocet but can tolerate Norco Topical TXA   Ardeen Jourdain, PA-C

## 2015-03-30 ENCOUNTER — Inpatient Hospital Stay (HOSPITAL_COMMUNITY): Payer: Medicare Other | Admitting: Anesthesiology

## 2015-03-30 ENCOUNTER — Encounter (HOSPITAL_COMMUNITY): Payer: Self-pay | Admitting: *Deleted

## 2015-03-30 ENCOUNTER — Encounter (HOSPITAL_COMMUNITY)
Admission: RE | Disposition: A | Discharge status: Home Health | Payer: Self-pay | Source: Ambulatory Visit | Attending: Orthopedic Surgery

## 2015-03-30 ENCOUNTER — Inpatient Hospital Stay (HOSPITAL_COMMUNITY)
Admission: RE | Admit: 2015-03-30 | Discharge: 2015-04-01 | DRG: 470 | Disposition: A | Discharge status: Home Health | Payer: Medicare Other | Source: Ambulatory Visit | Attending: Orthopedic Surgery | Admitting: Orthopedic Surgery

## 2015-03-30 DIAGNOSIS — Z7982 Long term (current) use of aspirin: Secondary | ICD-10-CM

## 2015-03-30 DIAGNOSIS — I2510 Atherosclerotic heart disease of native coronary artery without angina pectoris: Secondary | ICD-10-CM | POA: Diagnosis present

## 2015-03-30 DIAGNOSIS — Z8249 Family history of ischemic heart disease and other diseases of the circulatory system: Secondary | ICD-10-CM

## 2015-03-30 DIAGNOSIS — Z79899 Other long term (current) drug therapy: Secondary | ICD-10-CM

## 2015-03-30 DIAGNOSIS — Z87891 Personal history of nicotine dependence: Secondary | ICD-10-CM | POA: Diagnosis not present

## 2015-03-30 DIAGNOSIS — Z981 Arthrodesis status: Secondary | ICD-10-CM | POA: Diagnosis not present

## 2015-03-30 DIAGNOSIS — F419 Anxiety disorder, unspecified: Secondary | ICD-10-CM | POA: Diagnosis not present

## 2015-03-30 DIAGNOSIS — K219 Gastro-esophageal reflux disease without esophagitis: Secondary | ICD-10-CM | POA: Diagnosis present

## 2015-03-30 DIAGNOSIS — I10 Essential (primary) hypertension: Secondary | ICD-10-CM | POA: Diagnosis present

## 2015-03-30 DIAGNOSIS — M25561 Pain in right knee: Secondary | ICD-10-CM | POA: Diagnosis present

## 2015-03-30 DIAGNOSIS — E780 Pure hypercholesterolemia: Secondary | ICD-10-CM | POA: Diagnosis not present

## 2015-03-30 DIAGNOSIS — E785 Hyperlipidemia, unspecified: Secondary | ICD-10-CM | POA: Diagnosis present

## 2015-03-30 DIAGNOSIS — Z85828 Personal history of other malignant neoplasm of skin: Secondary | ICD-10-CM

## 2015-03-30 DIAGNOSIS — M1711 Unilateral primary osteoarthritis, right knee: Secondary | ICD-10-CM | POA: Diagnosis present

## 2015-03-30 DIAGNOSIS — Z96659 Presence of unspecified artificial knee joint: Secondary | ICD-10-CM

## 2015-03-30 HISTORY — PX: TOTAL KNEE ARTHROPLASTY: SHX125

## 2015-03-30 LAB — TYPE AND SCREEN
ABO/RH(D): O POS
Antibody Screen: NEGATIVE

## 2015-03-30 SURGERY — ARTHROPLASTY, KNEE, TOTAL
Anesthesia: General | Site: Knee | Laterality: Right

## 2015-03-30 MED ORDER — ACETAMINOPHEN 10 MG/ML IV SOLN
1000.0000 mg | Freq: Once | INTRAVENOUS | Status: AC
  Administered 2015-03-30: 1000 mg via INTRAVENOUS

## 2015-03-30 MED ORDER — HYDROCODONE-ACETAMINOPHEN 5-325 MG PO TABS
1.0000 | ORAL_TABLET | Freq: Three times a day (TID) | ORAL | Status: DC
  Administered 2015-03-30 – 2015-03-31 (×2): 1 via ORAL
  Filled 2015-03-30 (×5): qty 1

## 2015-03-30 MED ORDER — ACETAMINOPHEN 650 MG RE SUPP: 650.0000 mg | Freq: Four times a day (QID) | RECTAL | Status: DC | PRN

## 2015-03-30 MED ORDER — CELECOXIB 200 MG PO CAPS
200.0000 mg | ORAL_CAPSULE | Freq: Two times a day (BID) | ORAL | Status: DC
  Administered 2015-03-30 – 2015-04-01 (×4): 200 mg via ORAL
  Filled 2015-03-30 (×6): qty 1

## 2015-03-30 MED ORDER — OXYCODONE-ACETAMINOPHEN 5-325 MG PO TABS
2.0000 | ORAL_TABLET | ORAL | Status: DC | PRN
  Filled 2015-03-30: qty 2

## 2015-03-30 MED ORDER — MEPERIDINE HCL 50 MG/ML IJ SOLN
6.2500 mg | INTRAMUSCULAR | Status: DC | PRN
  Administered 2015-03-30: 12.5 mg via INTRAVENOUS

## 2015-03-30 MED ORDER — SODIUM CHLORIDE 0.9 % IR SOLN
Status: DC | PRN
  Administered 2015-03-30: 500 mL

## 2015-03-30 MED ORDER — LIDOCAINE HCL (CARDIAC) 20 MG/ML IV SOLN
INTRAVENOUS | Status: AC
  Filled 2015-03-30: qty 5

## 2015-03-30 MED ORDER — ATORVASTATIN CALCIUM 40 MG PO TABS
40.0000 mg | ORAL_TABLET | Freq: Every day | ORAL | Status: DC
  Administered 2015-03-30 – 2015-04-01 (×3): 40 mg via ORAL
  Filled 2015-03-30 (×3): qty 1

## 2015-03-30 MED ORDER — PROPOFOL 10 MG/ML IV BOLUS
INTRAVENOUS | Status: DC | PRN
  Administered 2015-03-30: 150 mg via INTRAVENOUS

## 2015-03-30 MED ORDER — BUPIVACAINE HCL (PF) 0.25 % IJ SOLN
INTRAMUSCULAR | Status: AC
  Filled 2015-03-30: qty 30

## 2015-03-30 MED ORDER — CHLORHEXIDINE GLUCONATE 4 % EX LIQD: 60.0000 mL | Freq: Once | CUTANEOUS | Status: DC

## 2015-03-30 MED ORDER — FENTANYL CITRATE (PF) 250 MCG/5ML IJ SOLN
INTRAMUSCULAR | Status: AC
  Filled 2015-03-30: qty 25

## 2015-03-30 MED ORDER — PROPOFOL 10 MG/ML IV BOLUS
INTRAVENOUS | Status: AC
  Filled 2015-03-30: qty 20

## 2015-03-30 MED ORDER — MEPERIDINE HCL 50 MG/ML IJ SOLN
INTRAMUSCULAR | Status: AC
  Filled 2015-03-30: qty 1

## 2015-03-30 MED ORDER — LACTATED RINGERS IV SOLN: INTRAVENOUS | Status: DC

## 2015-03-30 MED ORDER — METHOCARBAMOL 1000 MG/10ML IJ SOLN
500.0000 mg | Freq: Four times a day (QID) | INTRAMUSCULAR | Status: DC | PRN
  Administered 2015-03-30: 500 mg via INTRAVENOUS
  Filled 2015-03-30 (×2): qty 5

## 2015-03-30 MED ORDER — GLYCOPYRROLATE 0.2 MG/ML IJ SOLN
INTRAMUSCULAR | Status: AC
  Filled 2015-03-30: qty 1

## 2015-03-30 MED ORDER — CEFAZOLIN SODIUM-DEXTROSE 2-3 GM-% IV SOLR
INTRAVENOUS | Status: AC
  Filled 2015-03-30: qty 50

## 2015-03-30 MED ORDER — SODIUM CHLORIDE 0.9 % IR SOLN
Status: AC
  Filled 2015-03-30: qty 1

## 2015-03-30 MED ORDER — FENTANYL CITRATE (PF) 100 MCG/2ML IJ SOLN
INTRAMUSCULAR | Status: DC | PRN
  Administered 2015-03-30 (×3): 50 ug via INTRAVENOUS
  Administered 2015-03-30: 100 ug via INTRAVENOUS

## 2015-03-30 MED ORDER — THROMBIN 5000 UNITS EX SOLR
CUTANEOUS | Status: AC
  Filled 2015-03-30: qty 10000

## 2015-03-30 MED ORDER — ONDANSETRON HCL 4 MG PO TABS
4.0000 mg | ORAL_TABLET | Freq: Four times a day (QID) | ORAL | Status: DC | PRN
  Administered 2015-03-31: 4 mg via ORAL
  Filled 2015-03-30: qty 1

## 2015-03-30 MED ORDER — HYDROMORPHONE HCL 1 MG/ML IJ SOLN
0.2500 mg | INTRAMUSCULAR | Status: DC | PRN
  Administered 2015-03-30 (×4): 0.5 mg via INTRAVENOUS

## 2015-03-30 MED ORDER — MIDAZOLAM HCL 2 MG/2ML IJ SOLN
INTRAMUSCULAR | Status: AC
  Filled 2015-03-30: qty 4

## 2015-03-30 MED ORDER — ACETAMINOPHEN 325 MG PO TABS: 650.0000 mg | ORAL_TABLET | Freq: Four times a day (QID) | ORAL | Status: DC | PRN

## 2015-03-30 MED ORDER — HYDROMORPHONE HCL 1 MG/ML IJ SOLN
1.0000 mg | INTRAMUSCULAR | Status: DC | PRN
  Administered 2015-03-30 – 2015-03-31 (×4): 1 mg via INTRAVENOUS
  Filled 2015-03-30 (×4): qty 1

## 2015-03-30 MED ORDER — HYDROMORPHONE HCL 1 MG/ML IJ SOLN
INTRAMUSCULAR | Status: AC
  Administered 2015-03-31: 1 mg via INTRAVENOUS
  Filled 2015-03-30: qty 2

## 2015-03-30 MED ORDER — ROCURONIUM BROMIDE 100 MG/10ML IV SOLN
INTRAVENOUS | Status: DC | PRN
  Administered 2015-03-30: 35 mg via INTRAVENOUS

## 2015-03-30 MED ORDER — THROMBIN 5000 UNITS EX SOLR
OROMUCOSAL | Status: DC | PRN
  Administered 2015-03-30: 5 mL

## 2015-03-30 MED ORDER — SODIUM CHLORIDE 0.9 % IJ SOLN
INTRAMUSCULAR | Status: DC | PRN
  Administered 2015-03-30: 20 mL

## 2015-03-30 MED ORDER — ONDANSETRON HCL 4 MG/2ML IJ SOLN
4.0000 mg | Freq: Four times a day (QID) | INTRAMUSCULAR | Status: DC | PRN
  Administered 2015-03-30: 4 mg via INTRAVENOUS
  Filled 2015-03-30: qty 2

## 2015-03-30 MED ORDER — LACTATED RINGERS IV SOLN
INTRAVENOUS | Status: DC
  Administered 2015-03-30: 19:00:00 via INTRAVENOUS

## 2015-03-30 MED ORDER — PHENOL 1.4 % MT LIQD
1.0000 | OROMUCOSAL | Status: DC | PRN
  Filled 2015-03-30: qty 177

## 2015-03-30 MED ORDER — RIVAROXABAN 10 MG PO TABS
10.0000 mg | ORAL_TABLET | Freq: Every day | ORAL | Status: DC
  Administered 2015-03-31 – 2015-04-01 (×2): 10 mg via ORAL
  Filled 2015-03-30 (×3): qty 1

## 2015-03-30 MED ORDER — FERROUS SULFATE 325 (65 FE) MG PO TABS
325.0000 mg | ORAL_TABLET | Freq: Three times a day (TID) | ORAL | Status: DC
  Administered 2015-03-30 – 2015-04-01 (×4): 325 mg via ORAL
  Filled 2015-03-30 (×8): qty 1

## 2015-03-30 MED ORDER — INFLUENZA VAC SPLIT QUAD 0.5 ML IM SUSY
0.5000 mL | PREFILLED_SYRINGE | INTRAMUSCULAR | Status: AC
  Administered 2015-03-31: 0.5 mL via INTRAMUSCULAR
  Filled 2015-03-30 (×2): qty 0.5

## 2015-03-30 MED ORDER — SODIUM CHLORIDE 0.9 % IJ SOLN
INTRAMUSCULAR | Status: AC
Start: 2015-03-30 — End: 2015-03-30
  Filled 2015-03-30: qty 50

## 2015-03-30 MED ORDER — TRANEXAMIC ACID 1000 MG/10ML IV SOLN
2000.0000 mg | Freq: Once | INTRAVENOUS | Status: DC
  Filled 2015-03-30: qty 20

## 2015-03-30 MED ORDER — CHLORHEXIDINE GLUCONATE 4 % EX LIQD
60.0000 mL | Freq: Once | CUTANEOUS | Status: DC
Start: 2015-03-30 — End: 2015-03-30

## 2015-03-30 MED ORDER — POLYETHYLENE GLYCOL 3350 17 G PO PACK: 17.0000 g | PACK | Freq: Every day | ORAL | Status: DC | PRN

## 2015-03-30 MED ORDER — MIDAZOLAM HCL 5 MG/5ML IJ SOLN
INTRAMUSCULAR | Status: DC | PRN
  Administered 2015-03-30: 2 mg via INTRAVENOUS

## 2015-03-30 MED ORDER — HYDROMORPHONE HCL 1 MG/ML IJ SOLN
INTRAMUSCULAR | Status: AC
  Administered 2015-03-30: 1 mg
  Filled 2015-03-30: qty 1

## 2015-03-30 MED ORDER — BUPIVACAINE LIPOSOME 1.3 % IJ SUSP
20.0000 mL | Freq: Once | INTRAMUSCULAR | Status: DC
  Filled 2015-03-30: qty 20

## 2015-03-30 MED ORDER — ONDANSETRON HCL 4 MG/2ML IJ SOLN
INTRAMUSCULAR | Status: DC | PRN
  Administered 2015-03-30: 4 mg via INTRAVENOUS

## 2015-03-30 MED ORDER — GLYCOPYRROLATE 0.2 MG/ML IJ SOLN
INTRAMUSCULAR | Status: DC | PRN
  Administered 2015-03-30: 0.6 mg via INTRAVENOUS

## 2015-03-30 MED ORDER — BUPIVACAINE LIPOSOME 1.3 % IJ SUSP
INTRAMUSCULAR | Status: DC | PRN
  Administered 2015-03-30: 20 mL

## 2015-03-30 MED ORDER — PANTOPRAZOLE SODIUM 40 MG PO TBEC: 40.0000 mg | DELAYED_RELEASE_TABLET | Freq: Every day | ORAL | Status: DC | PRN

## 2015-03-30 MED ORDER — NITROGLYCERIN 0.4 MG SL SUBL: 0.4000 mg | SUBLINGUAL_TABLET | SUBLINGUAL | Status: DC | PRN

## 2015-03-30 MED ORDER — ALUM & MAG HYDROXIDE-SIMETH 200-200-20 MG/5ML PO SUSP: 30.0000 mL | ORAL | Status: DC | PRN

## 2015-03-30 MED ORDER — ACETAMINOPHEN 10 MG/ML IV SOLN
INTRAVENOUS | Status: AC
  Filled 2015-03-30: qty 100

## 2015-03-30 MED ORDER — MENTHOL 3 MG MT LOZG: 1.0000 | LOZENGE | OROMUCOSAL | Status: DC | PRN

## 2015-03-30 MED ORDER — CEFAZOLIN SODIUM-DEXTROSE 2-3 GM-% IV SOLR
2.0000 g | INTRAVENOUS | Status: AC
  Administered 2015-03-30: 2 g via INTRAVENOUS

## 2015-03-30 MED ORDER — BISACODYL 5 MG PO TBEC: 5.0000 mg | DELAYED_RELEASE_TABLET | Freq: Every day | ORAL | Status: DC | PRN

## 2015-03-30 MED ORDER — FLEET ENEMA 7-19 GM/118ML RE ENEM: 1.0000 | ENEMA | Freq: Once | RECTAL | Status: DC | PRN

## 2015-03-30 MED ORDER — LOSARTAN POTASSIUM 50 MG PO TABS
50.0000 mg | ORAL_TABLET | Freq: Every evening | ORAL | Status: DC
  Administered 2015-03-30 – 2015-03-31 (×2): 50 mg via ORAL
  Filled 2015-03-30 (×3): qty 1

## 2015-03-30 MED ORDER — HYDROMORPHONE HCL 1 MG/ML IJ SOLN
INTRAMUSCULAR | Status: AC
  Filled 2015-03-30: qty 1

## 2015-03-30 MED ORDER — LIDOCAINE HCL (CARDIAC) 20 MG/ML IV SOLN
INTRAVENOUS | Status: DC | PRN
  Administered 2015-03-30: 90 mg via INTRAVENOUS

## 2015-03-30 MED ORDER — NEOSTIGMINE METHYLSULFATE 10 MG/10ML IV SOLN
INTRAVENOUS | Status: DC | PRN
  Administered 2015-03-30: 4 mg via INTRAVENOUS

## 2015-03-30 MED ORDER — METHOCARBAMOL 500 MG PO TABS
500.0000 mg | ORAL_TABLET | Freq: Four times a day (QID) | ORAL | Status: DC | PRN
  Administered 2015-03-31 – 2015-04-01 (×5): 500 mg via ORAL
  Filled 2015-03-30 (×5): qty 1

## 2015-03-30 MED ORDER — NEOSTIGMINE METHYLSULFATE 10 MG/10ML IV SOLN
INTRAVENOUS | Status: AC
  Filled 2015-03-30: qty 1

## 2015-03-30 MED ORDER — TRANEXAMIC ACID 1000 MG/10ML IV SOLN
2000.0000 mg | INTRAVENOUS | Status: DC | PRN
  Administered 2015-03-30: 2000 mg via TOPICAL

## 2015-03-30 MED ORDER — BUPIVACAINE HCL (PF) 0.25 % IJ SOLN
INTRAMUSCULAR | Status: DC | PRN
  Administered 2015-03-30: 20 mL

## 2015-03-30 MED ORDER — ALPRAZOLAM 0.5 MG PO TABS
0.5000 mg | ORAL_TABLET | Freq: Every evening | ORAL | Status: DC | PRN
  Administered 2015-03-31: 0.5 mg via ORAL
  Filled 2015-03-30: qty 1

## 2015-03-30 MED ORDER — LACTATED RINGERS IV SOLN
INTRAVENOUS | Status: DC
  Administered 2015-03-30: 12:00:00 via INTRAVENOUS

## 2015-03-30 MED ORDER — CEFAZOLIN SODIUM 1-5 GM-% IV SOLN
1.0000 g | Freq: Four times a day (QID) | INTRAVENOUS | Status: AC
  Administered 2015-03-30 – 2015-03-31 (×2): 1 g via INTRAVENOUS
  Filled 2015-03-30 (×3): qty 50

## 2015-03-30 SURGICAL SUPPLY — 82 items
BAG DECANTER FOR FLEXI CONT (MISCELLANEOUS) ×2 IMPLANT
BAG SPEC THK2 15X12 ZIP CLS (MISCELLANEOUS) ×1
BAG ZIPLOCK 12X15 (MISCELLANEOUS) ×2 IMPLANT
BANDAGE ELASTIC 4 VELCRO ST LF (GAUZE/BANDAGES/DRESSINGS) ×3 IMPLANT
BANDAGE ELASTIC 6 VELCRO ST LF (GAUZE/BANDAGES/DRESSINGS) ×3 IMPLANT
BANDAGE ESMARK 6X9 LF (GAUZE/BANDAGES/DRESSINGS) ×1 IMPLANT
BLADE SAG 18X100X1.27 (BLADE) ×3 IMPLANT
BLADE SAW SGTL 11.0X1.19X90.0M (BLADE) ×3 IMPLANT
BNDG CMPR 9X6 STRL LF SNTH (GAUZE/BANDAGES/DRESSINGS) ×1
BNDG ESMARK 6X9 LF (GAUZE/BANDAGES/DRESSINGS) ×3
BONE CEMENT GENTAMICIN (Cement) ×6 IMPLANT
CAP KNEE TOTAL 3 SIGMA ×2 IMPLANT
CEMENT BONE GENTAMICIN 40 (Cement) ×2 IMPLANT
CUFF TOURN SGL QUICK 34 (TOURNIQUET CUFF) ×3
CUFF TRNQT CYL 34X4X40X1 (TOURNIQUET CUFF) ×1 IMPLANT
DECANTER SPIKE VIAL GLASS SM (MISCELLANEOUS) ×3 IMPLANT
DRAPE EXTREMITY T 121X128X90 (DRAPE) ×3 IMPLANT
DRAPE INCISE IOBAN 66X45 STRL (DRAPES) IMPLANT
DRAPE POUCH INSTRU U-SHP 10X18 (DRAPES) ×3 IMPLANT
DRAPE SHEET LG 3/4 BI-LAMINATE (DRAPES) ×3 IMPLANT
DRAPE U-SHAPE 47X51 STRL (DRAPES) ×3 IMPLANT
DRSG AQUACEL AG ADV 3.5X10 (GAUZE/BANDAGES/DRESSINGS) ×3 IMPLANT
DRSG PAD ABDOMINAL 8X10 ST (GAUZE/BANDAGES/DRESSINGS) IMPLANT
DRSG TEGADERM 4X4.75 (GAUZE/BANDAGES/DRESSINGS) ×3 IMPLANT
DURAPREP 26ML APPLICATOR (WOUND CARE) ×3 IMPLANT
ELECT REM PT RETURN 9FT ADLT (ELECTROSURGICAL) ×3
ELECTRODE REM PT RTRN 9FT ADLT (ELECTROSURGICAL) ×1 IMPLANT
EVACUATOR 1/8 PVC DRAIN (DRAIN) ×3 IMPLANT
FACESHIELD WRAPAROUND (MASK) ×18 IMPLANT
FACESHIELD WRAPAROUND OR TEAM (MASK) ×5 IMPLANT
GAUZE SPONGE 2X2 8PLY STRL LF (GAUZE/BANDAGES/DRESSINGS) ×1 IMPLANT
GLOVE BIOGEL PI IND STRL 6.5 (GLOVE) ×1 IMPLANT
GLOVE BIOGEL PI IND STRL 7.5 (GLOVE) IMPLANT
GLOVE BIOGEL PI IND STRL 8 (GLOVE) ×1 IMPLANT
GLOVE BIOGEL PI INDICATOR 6.5 (GLOVE) ×2
GLOVE BIOGEL PI INDICATOR 7.5 (GLOVE) ×2
GLOVE BIOGEL PI INDICATOR 8 (GLOVE) ×2
GLOVE ECLIPSE 8.0 STRL XLNG CF (GLOVE) ×6 IMPLANT
GLOVE INDICATOR 6.5 STRL GRN (GLOVE) ×2 IMPLANT
GLOVE SURG SS PI 6.5 STRL IVOR (GLOVE) ×7 IMPLANT
GLOVE SURG SS PI 7.0 STRL IVOR (GLOVE) ×2 IMPLANT
GOWN STRL REUS W/TWL LRG LVL3 (GOWN DISPOSABLE) ×5 IMPLANT
GOWN STRL REUS W/TWL XL LVL3 (GOWN DISPOSABLE) ×5 IMPLANT
HANDPIECE INTERPULSE COAX TIP (DISPOSABLE) ×3
IMMOBILIZER KNEE 20 (SOFTGOODS) ×3
IMMOBILIZER KNEE 20 THIGH 36 (SOFTGOODS) ×1 IMPLANT
KIT BASIN OR (CUSTOM PROCEDURE TRAY) ×3 IMPLANT
LIQUID BAND (GAUZE/BANDAGES/DRESSINGS) ×3 IMPLANT
MANIFOLD NEPTUNE II (INSTRUMENTS) ×3 IMPLANT
NDL SAFETY ECLIPSE 18X1.5 (NEEDLE) ×2 IMPLANT
NEEDLE HYPO 18GX1.5 SHARP (NEEDLE) ×9
NEEDLE HYPO 22GX1.5 SAFETY (NEEDLE) ×3 IMPLANT
NS IRRIG 1000ML POUR BTL (IV SOLUTION) IMPLANT
PACK TOTAL JOINT (CUSTOM PROCEDURE TRAY) ×3 IMPLANT
PADDING CAST COTTON 6X4 STRL (CAST SUPPLIES) ×2 IMPLANT
PEN SKIN MARKING BROAD (MISCELLANEOUS) ×3 IMPLANT
POSITIONER SURGICAL ARM (MISCELLANEOUS) ×3 IMPLANT
SET HNDPC FAN SPRY TIP SCT (DISPOSABLE) ×1 IMPLANT
SET PAD KNEE POSITIONER (MISCELLANEOUS) ×3 IMPLANT
SPONGE GAUZE 2X2 STER 10/PKG (GAUZE/BANDAGES/DRESSINGS) ×2
SPONGE LAP 18X18 X RAY DECT (DISPOSABLE) ×2 IMPLANT
SPONGE SURGIFOAM ABS GEL 100 (HEMOSTASIS) ×3 IMPLANT
STAPLER VISISTAT 35W (STAPLE) IMPLANT
SUCTION FRAZIER 12FR DISP (SUCTIONS) ×3 IMPLANT
SUT BONE WAX W31G (SUTURE) ×3 IMPLANT
SUT MNCRL AB 4-0 PS2 18 (SUTURE) ×3 IMPLANT
SUT VIC AB 1 CT1 27 (SUTURE) ×6
SUT VIC AB 1 CT1 27XBRD ANTBC (SUTURE) ×2 IMPLANT
SUT VIC AB 2-0 CT1 27 (SUTURE) ×9
SUT VIC AB 2-0 CT1 TAPERPNT 27 (SUTURE) ×3 IMPLANT
SUT VLOC 180 0 24IN GS25 (SUTURE) ×3 IMPLANT
SYR 20CC LL (SYRINGE) ×6 IMPLANT
SYR 50ML LL SCALE MARK (SYRINGE) ×3 IMPLANT
TOWEL OR 17X26 10 PK STRL BLUE (TOWEL DISPOSABLE) ×3 IMPLANT
TOWEL OR NON WOVEN STRL DISP B (DISPOSABLE) ×2 IMPLANT
TOWER CARTRIDGE SMART MIX (DISPOSABLE) ×3 IMPLANT
TRAY FOLEY W/METER SILVER 14FR (SET/KITS/TRAYS/PACK) ×1 IMPLANT
TRAY FOLEY W/METER SILVER 16FR (SET/KITS/TRAYS/PACK) ×3 IMPLANT
WATER STERILE IRR 1000ML POUR (IV SOLUTION) ×4 IMPLANT
WATER STERILE IRR 1500ML POUR (IV SOLUTION) ×1 IMPLANT
WRAP KNEE MAXI GEL POST OP (GAUZE/BANDAGES/DRESSINGS) ×3 IMPLANT
YANKAUER SUCT BULB TIP 10FT TU (MISCELLANEOUS) ×3 IMPLANT

## 2015-03-30 NOTE — Op Note (Signed)
Edward [name], Edward [name]                  ACCOUNT NO.:  [id number]  MEDICAL RECORD NO.:  [id number]  LOCATION:  21                         FACILITY:  [facility]  PHYSICIAN:  Kipp Brood. Gioffre, M.D.DATE OF BIRTH:  [birth year]  DATE OF PROCEDURE:  03/30/2015 DATE OF DISCHARGE:                              OPERATIVE REPORT   SURGEON:  Kipp Brood. Gladstone Lighter, M.D.  ASSISTANT:  Ardeen Jourdain, [place].  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  OPERATION:  Right total knee arthroplasty.  Note, gentamicin was used in the cement.  DESCRIPTION OF PROCEDURE:  At this time, the appropriate time-out was first carried out.  I also marked the appropriate right leg in the holding area.  After sterile prep and draping was carried out, we then exsanguinated the leg with an Esmarch.  Tourniquet was elevated at 325 mmHg.  The right leg was placed then in the [facility] knee holder.  The anterior approach to the knee was carried out.  Two flaps were created. I then did a median parapatellar incision, reflected patella laterally and did medial and lateral meniscectomies, and excised the anterior posterior cruciate ligaments.  We did a synovectomy as well.  Next, attention was directed to the femur, drill hole was made in the intercondylar notch region of the femur.  The canal finder was inserted to make sure we were in the canal and we were.  I thoroughly irrigated the canal and after removed the canal finder.  We then measured the femur to be a size 4 right.  We then did anterior-posterior and chamfering cuts for a size 4 right femur.  Following that, we then directed attention to the tibial plateau.  We made an initial drill hole in the tibial plateau in the usual fashion.  We measured the tibial plateau to be a size 4 tray.  We then  inserted our next K guide and removed 6 mm thickness off the affected medial side of the tibia.  Note, the medial side was extremely hard bone highlight, extremely hard bone. After this, we then inserted our lamina spreaders, removed the posterior spurs and loose bodies.  Following that, we then went on and inserted our spacer blocks and felt that a size 10 mm thickness insert was the best at this point.  We then prepared the remaining part of the tibia. We cut our keel cut out of the tibial plateau.  We then cut our notch cut out of the distal femur.  Trial components were inserted.  We had excellent function with a size 10 mm thickness insert.  We did try trial 5 and it was too big.  Following that, we then did a resurfacing procedure on the patella for a size 41 patella.  Three drill holes were made in the articular surface of the patella.  All trial components were removed.  We thoroughly water picked out the knee, dried the knee out and inserted all 3 components in simultaneously with gentamicin in the cement.  Once the cement was hardened, all loose pieces were  removed. Took the knee through motion again, once again agreed to use a size 10 mm thickness insert.  Further sizes used were as follows, tibial tray was a size 40 insert and a size 410 mm thickness rotating platform, insert the patella was a size 41 with 3 pegs.  The femoral component was a size 4 right, posterior cruciate sacrificing type.  After we thoroughly water picked out the knee, removed all the cement.  We then inserted a Hemovac drain and closed the knee in layers in the usual fashion.  We during the middle of procedure injected 20 mL of 0.5% plain Marcaine into the soft tissue structures.  At the end, we injected a mixture of 20 mL of Exparel with 20 mL of normal saline.  We also injected within the capsule the tranexamic acid in usual fashion.  Prior to surgery, the patient had 2 g of IV Ancef.           ______________________________ Kipp Brood. Gladstone Lighter, M.D.     RAG/MEDQ  D:  03/30/2015  T:  03/30/2015  Job:  [id number]

## 2015-03-30 NOTE — Anesthesia Preprocedure Evaluation (Addendum)
Anesthesia Evaluation  Patient identified by MRN, date of birth, ID band Patient awake    Reviewed: Allergy & Precautions, H&P , NPO status , Patient's Chart, lab work & pertinent test results  History of Anesthesia Complications Negative for: history of anesthetic complications  Airway Mallampati: II  TM Distance: >3 FB Neck ROM: Full    Dental no notable dental hx. (+) Dental Advisory Given, Teeth Intact   Pulmonary former smoker,    Pulmonary exam normal breath sounds clear to auscultation       Cardiovascular Exercise Tolerance: Good hypertension, Pt. on medications + angina with exertion + CAD  Normal cardiovascular exam Rhythm:Regular Rate:Normal  Last angina with NTG was 4 months ago. Has cardiac clearance   Neuro/Psych PSYCHIATRIC DISORDERS Anxiety Depression Cervical fusion. Back surgery negative neurological ROS     GI/Hepatic Neg liver ROS, GERD  Medicated and Controlled,  Endo/Other  negative endocrine ROS  Renal/GU negative Renal ROS  negative genitourinary   Musculoskeletal  (+) Arthritis , Osteoarthritis,    Abdominal   Peds negative pediatric ROS (+)  Hematology negative hematology ROS (+)   Anesthesia Other Findings   Reproductive/Obstetrics negative OB ROS                          Anesthesia Physical Anesthesia Plan  ASA: III  Anesthesia Plan:    Post-op Pain Management:    Induction: Intravenous  Airway Management Planned: Oral ETT  Additional Equipment:   Intra-op Plan:   Post-operative Plan: Extubation in OR  Informed Consent:   Plan Discussed with: Surgeon  Anesthesia Plan Comments:        Anesthesia Quick Evaluation

## 2015-03-30 NOTE — Brief Op Note (Signed)
03/30/2015  2:09 PM  PATIENT:  Edward [name]  64 y.o. male  PRE-OPERATIVE DIAGNOSIS:  Right Knee Primary  Osteoarthritis  POST-OPERATIVE DIAGNOSIS:  Right Knee  Primary Osteoarthritis  PROCEDURE:  Procedure(s): RIGHT TOTAL KNEE ARTHROPLASTY (Right)  SURGEON:  Surgeon(s) and Role:    * Latanya Maudlin, MD - Primary  PHYSICIAN ASSISTANT: Ardeen Jourdain PA  ASSISTANTS:Amber Glenwood PA  ANESTHESIA:   general  EBL:  Total I/O In: -  Out: 250 [Urine:250]  BLOOD ADMINISTERED:none  DRAINS: (One) Hemovact drain(s) in the Right Knee with  Suction Open   LOCAL MEDICATIONS USED:  MARCAINE  20cc of 0.25%Plain and 20cc of Exparel mixed with 20cc of Normal Saline.   SPECIMEN:  No Specimen  DISPOSITION OF SPECIMEN:  N/A  COUNTS:  YES  TOURNIQUET:   Total Tourniquet Time Documented: Thigh (Right) - 83 minutes Total: Thigh (Right) - 83 minutes   DICTATION: .Other Dictation: Dictation Number [id number]  PLAN OF CARE: Admit to inpatient   PATIENT DISPOSITION:  Stable in OR   Delay start of Pharmacological VTE agent (>24hrs) due to surgical blood loss or risk of bleeding: yes

## 2015-03-30 NOTE — Interval H&P Note (Signed)
History and Physical Interval Note:  03/30/2015 12:09 PM  Edward [name]  has presented today for surgery, with the diagnosis of Right Knee Osteoarthritis  The various methods of treatment have been discussed with the patient and family. After consideration of risks, benefits and other options for treatment, the patient has consented to  Procedure(s): RIGHT TOTAL KNEE ARTHROPLASTY (Right) as a surgical intervention .  The patient's history has been reviewed, patient examined, no change in status, stable for surgery.  I have reviewed the patient's chart and labs.  Questions were answered to the patient's satisfaction.     GIOFFRE,RONALD A

## 2015-03-30 NOTE — Anesthesia Postprocedure Evaluation (Signed)
  Anesthesia Post-op Note  Patient: Edward [name]  Procedure(s) Performed: Procedure(s) (LRB): RIGHT TOTAL KNEE ARTHROPLASTY (Right)  Patient Location: PACU  Anesthesia Type: General  Level of Consciousness: awake and alert   Airway and Oxygen Therapy: Patient Spontanous Breathing  Post-op Pain: mild  Post-op Assessment: Post-op Vital signs reviewed, Patient's Cardiovascular Status Stable, Respiratory Function Stable, Patent Airway and No signs of Nausea or vomiting  Last Vitals:  Filed Vitals:   03/30/15 1515  BP: 114/52  Pulse: 63  Temp:   Resp: 11    Post-op Vital Signs: stable   Complications: No apparent anesthesia complications

## 2015-03-30 NOTE — Anesthesia Procedure Notes (Signed)
Date/Time: 03/30/2015 12:23 PM Performed by: Chyrel Masson Pre-anesthesia Checklist: Patient identified, Emergency Drugs available, Suction available, Patient being monitored and Timeout performed Patient Re-evaluated:Patient Re-evaluated prior to inductionOxygen Delivery Method: Circle system utilized and Simple face mask Preoxygenation: Pre-oxygenation with 100% oxygen Intubation Type: IV induction Ventilation: Mask ventilation without difficulty Laryngoscope Size: Mac and 4 Placement Confirmation: ETT inserted through vocal cords under direct vision,  positive ETCO2 and breath sounds checked- equal and bilateral Secured at: 21 cm Tube secured with: Tape Dental Injury: Teeth and Oropharynx as per pre-operative assessment

## 2015-03-30 NOTE — Transfer of Care (Signed)
Immediate Anesthesia Transfer of Care Note  Patient: Edward [name]  Procedure(s) Performed: Procedure(s): RIGHT TOTAL KNEE ARTHROPLASTY (Right)  Patient Location: PACU  Anesthesia Type:General  Level of Consciousness: awake, alert  and oriented  Airway & Oxygen Therapy: Patient Spontanous Breathing and Patient connected to face mask oxygen  Post-op Assessment: Report given to RN and Post -op Vital signs reviewed and stable  Post vital signs: Reviewed and stable  Last Vitals:  Filed Vitals:   03/30/15 0955  BP: 119/77  Pulse: 69  Temp: 36.7 C  Resp: 18    Complications: No apparent anesthesia complications

## 2015-03-31 DIAGNOSIS — M1711 Unilateral primary osteoarthritis, right knee: Secondary | ICD-10-CM | POA: Diagnosis not present

## 2015-03-31 LAB — BASIC METABOLIC PANEL
Anion gap: 7 (ref 5–15)
BUN: 9 mg/dL (ref 6–20)
CO2: 29 mmol/L (ref 22–32)
CREATININE: 0.57 mg/dL — AB (ref 0.61–1.24)
Calcium: 8.8 mg/dL — ABNORMAL LOW (ref 8.9–10.3)
Chloride: 103 mmol/L (ref 101–111)
GFR calc Af Amer: >60 mL/min (ref >60)
Glucose, Bld: 125 mg/dL — ABNORMAL HIGH (ref 65–99)
Potassium: 3.9 mmol/L (ref 3.5–5.1)
SODIUM: 139 mmol/L (ref 135–145)

## 2015-03-31 LAB — CBC
HCT: 35.2 % — ABNORMAL LOW (ref 39.0–52.0)
Hemoglobin: 12 g/dL — ABNORMAL LOW (ref 13.0–17.0)
MCH: 30.2 pg (ref 26.0–34.0)
MCHC: 34.1 g/dL (ref 30.0–36.0)
MCV: 88.4 fL (ref 78.0–100.0)
PLATELETS: 141 10*3/uL — AB (ref 150–400)
RBC: 3.98 MIL/uL — ABNORMAL LOW (ref 4.22–5.81)
RDW: 12.7 % (ref 11.5–15.5)
WBC: 9.8 10*3/uL (ref 4.0–10.5)

## 2015-03-31 MED ORDER — TIZANIDINE HCL 4 MG PO TABS: 4.0000 mg | ORAL_TABLET | Freq: Three times a day (TID) | ORAL | Status: DC | PRN

## 2015-03-31 MED ORDER — HYDROMORPHONE HCL 2 MG PO TABS: 2.0000 mg | ORAL_TABLET | ORAL | Status: DC | PRN

## 2015-03-31 MED ORDER — HYDROCODONE-ACETAMINOPHEN 5-325 MG PO TABS: 1.0000 | ORAL_TABLET | Freq: Four times a day (QID) | ORAL | Status: DC | PRN

## 2015-03-31 MED ORDER — HYDROMORPHONE HCL 2 MG PO TABS
2.0000 mg | ORAL_TABLET | ORAL | Status: DC | PRN
  Administered 2015-04-01: 2 mg via ORAL
  Filled 2015-03-31: qty 1

## 2015-03-31 MED ORDER — RIVAROXABAN 10 MG PO TABS: 10.0000 mg | ORAL_TABLET | Freq: Every day | ORAL | Status: DC

## 2015-03-31 MED ORDER — HYDROCODONE-ACETAMINOPHEN 5-325 MG PO TABS
1.0000 | ORAL_TABLET | ORAL | Status: DC | PRN
  Administered 2015-03-31 – 2015-04-01 (×7): 2 via ORAL
  Filled 2015-03-31 (×7): qty 2

## 2015-03-31 NOTE — Evaluation (Signed)
Physical Therapy Evaluation Patient Details Name: Edward [name] MRN: [id number] DOB: January 18, 1951 Today's Date: 03/31/2015   History of Present Illness  s/p R TKA  Clinical Impression  Pt admitted with above diagnosis. Pt currently with functional limitations due to the deficits listed below (see PT Problem List).  Pt will benefit from skilled PT to increase their independence and safety with mobility to allow discharge to the venue listed below.  Pt will benefit from continued PT, limited by pain this am-- therefore exercises deferred; will see again later;     Follow Up Recommendations Home health PT    Equipment Recommendations  None recommended by PT    Recommendations for Other Services       Precautions / Restrictions Precautions Precautions: Knee Required Braces or Orthoses: Knee Immobilizer - Right Knee Immobilizer - Right: Discontinue once straight leg raise with < 10 degree lag Restrictions Other Position/Activity Restrictions: WBAT      Mobility  Bed Mobility Overal bed mobility: Needs Assistance Bed Mobility: Supine to Sit     Supine to sit: Min assist     General bed mobility comments: assist with RLE, cues for technique  Transfers Overall transfer level: Needs assistance Equipment used: Rolling walker (2 wheeled) Transfers: Sit to/from Stand Sit to Stand: Min assist         General transfer comment: cues for hand placement  Ambulation/Gait Ambulation/Gait assistance: Min guard Ambulation Distance (Feet): 30 Feet Assistive device: Rolling walker (2 wheeled) Gait Pattern/deviations: Step-to pattern;Antalgic Gait velocity: decr   General Gait Details: cues for sequence, RW distance from self  Stairs            Wheelchair Mobility    Modified Rankin (Stroke Patients Only)       Balance Overall balance assessment: Needs assistance           Standing balance-Leahy Scale: Poor Standing balance comment: needs walker support to  maintain standing                             Pertinent Vitals/Pain Pain Assessment: 0-10 Pain Score: 2  Pain Location: right knee Pain Descriptors / Indicators: Sore Pain Intervention(s): Limited activity within patient's tolerance;Monitored during session;Premedicated before session    Home Living Family/patient expects to be discharged to:: Private residence   Available Help at Discharge: Family Type of Home: House (sister's) Home Access: Stairs to enter   Technical brewer of Steps: 2 Home Layout: Two level;Able to live on main level with bedroom/bathroom Home Equipment: Gilford Rile - 2 wheels;Bedside commode;Cane - single point      Prior Function Level of Independence: Independent               Hand Dominance        Extremity/Trunk Assessment   Upper Extremity Assessment: Defer to OT evaluation           Lower Extremity Assessment: RLE deficits/detail RLE Deficits / Details: ankle WFL, SLR with assist, too painful to test further       Communication   Communication: No difficulties  Cognition Arousal/Alertness: Awake/alert Behavior During Therapy: WFL for tasks assessed/performed Overall Cognitive Status: Within Functional Limits for tasks assessed                      General Comments      Exercises Total Joint Exercises Ankle Circles/Pumps: AROM;10 reps      Assessment/Plan    PT Assessment  Patient needs continued PT services  PT Diagnosis Difficulty walking   PT Problem List Decreased strength;Decreased range of motion;Decreased activity tolerance;Decreased mobility;Pain  PT Treatment Interventions DME instruction;Gait training;Functional mobility training;Therapeutic activities;Patient/family education;Therapeutic exercise;Stair training   PT Goals (Current goals can be found in the Care Plan section) Acute Rehab PT Goals Patient Stated Goal: back to playing golf PT Goal Formulation: With patient Time For Goal  Achievement: 04/07/15 Potential to Achieve Goals: Good    Frequency 7X/week   Barriers to discharge        Co-evaluation               End of Session Equipment Utilized During Treatment: Gait belt Activity Tolerance: Patient tolerated treatment well Patient left: in chair;with call bell/phone within reach Nurse Communication: Mobility status         Time: 0910-0939 PT Time Calculation (min) (ACUTE ONLY): 29 min   Charges:   PT Evaluation $Initial PT Evaluation Tier I: 1 Procedure PT Treatments $Gait Training: 8-22 mins   PT G Codes:        WILLIAMS,TARA 2015/04/28, 10:19 AM

## 2015-03-31 NOTE — Discharge Instructions (Addendum)
INSTRUCTIONS AFTER JOINT REPLACEMENT  ° °o Remove items at home which could result in a fall. This includes throw rugs or furniture in walking pathways °o ICE to the affected joint every three hours while awake for 30 minutes at a time, for at least the first 3-5 days, and then as needed for pain and swelling.  Continue to use ice for pain and swelling. You may notice swelling that will progress down to the foot and ankle.  This is normal after surgery.  Elevate your leg when you are not up walking on it.   °o Continue to use the breathing machine you got in the hospital (incentive spirometer) which will help keep your temperature down.  It is common for your temperature to cycle up and down following surgery, especially at night when you are not up moving around and exerting yourself.  The breathing machine keeps your lungs expanded and your temperature down. ° ° °DIET:  As you were doing prior to hospitalization, we recommend a well-balanced diet. ° °DRESSING / WOUND CARE / SHOWERING ° °Keep the surgical dressing until follow up.  The dressing is water proof, so you can shower without any extra covering.  IF THE DRESSING FALLS OFF or the wound gets wet inside, change the dressing with sterile gauze.  Please use good hand washing techniques before changing the dressing.  Do not use any lotions or creams on the incision until instructed by your surgeon.   ° °ACTIVITY ° °o Increase activity slowly as tolerated, but follow the weight bearing instructions below.   °o No driving for 6 weeks or until further direction given by your physician.  You cannot drive while taking narcotics.  °o No lifting or carrying greater than 10 lbs. until further directed by your surgeon. °o Avoid periods of inactivity such as sitting longer than an hour when not asleep. This helps prevent blood clots.  °o You may return to work once you are authorized by your doctor.  ° ° ° °WEIGHT BEARING  ° °Weight bearing as tolerated with assist  device (walker, cane, etc) as directed, use it as long as suggested by your surgeon or therapist, typically at least 4-6 weeks. ° ° °EXERCISES ° °Results after joint replacement surgery are often greatly improved when you follow the exercise, range of motion and muscle strengthening exercises prescribed by your doctor. Safety measures are also important to protect the joint from further injury. Any time any of these exercises cause you to have increased pain or swelling, decrease what you are doing until you are comfortable again and then slowly increase them. If you have problems or questions, call your caregiver or physical therapist for advice.  ° °Rehabilitation is important following a joint replacement. After just a few days of immobilization, the muscles of the leg can become weakened and shrink (atrophy).  These exercises are designed to build up the tone and strength of the thigh and leg muscles and to improve motion. Often times heat used for twenty to thirty minutes before working out will loosen up your tissues and help with improving the range of motion but do not use heat for the first two weeks following surgery (sometimes heat can increase post-operative swelling).  ° °These exercises can be done on a training (exercise) mat, on the floor, on a table or on a bed. Use whatever works the best and is most comfortable for you.    Use music or television while you are exercising so that   the exercises are a pleasant break in your day. This will make your life better with the exercises acting as a break in your routine that you can look forward to.   Perform all exercises about fifteen times, three times per day or as directed.  You should exercise both the operative leg and the other leg as well. ° °Exercises include: °  °• Quad Sets - Tighten up the muscle on the front of the thigh (Quad) and hold for 5-10 seconds.   °• Straight Leg Raises - With your knee straight (if you were given a brace, keep it on),  lift the leg to 60 degrees, hold for 3 seconds, and slowly lower the leg.  Perform this exercise against resistance later as your leg gets stronger.  °• Leg Slides: Lying on your back, slowly slide your foot toward your buttocks, bending your knee up off the floor (only go as far as is comfortable). Then slowly slide your foot back down until your leg is flat on the floor again.  °• Angel Wings: Lying on your back spread your legs to the side as far apart as you can without causing discomfort.  °• Hamstring Strength:  Lying on your back, push your heel against the floor with your leg straight by tightening up the muscles of your buttocks.  Repeat, but this time bend your knee to a comfortable angle, and push your heel against the floor.  You may put a pillow under the heel to make it more comfortable if necessary.  ° °A rehabilitation program following joint replacement surgery can speed recovery and prevent re-injury in the future due to weakened muscles. Contact your doctor or a physical therapist for more information on knee rehabilitation.  ° ° °CONSTIPATION ° °Constipation is defined medically as fewer than three stools per week and severe constipation as less than one stool per week.  Even if you have a regular bowel pattern at home, your normal regimen is likely to be disrupted due to multiple reasons following surgery.  Combination of anesthesia, postoperative narcotics, change in appetite and fluid intake all can affect your bowels.  ° °YOU MUST use at least one of the following options; they are listed in order of increasing strength to get the job done.  They are all available over the counter, and you may need to use some, POSSIBLY even all of these options:   ° °Drink plenty of fluids (prune juice may be helpful) and high fiber foods °Colace 100 mg by mouth twice a day  °Senokot for constipation as directed and as needed Dulcolax (bisacodyl), take with full glass of water  °Miralax (polyethylene glycol)  once or twice a day as needed. ° °If you have tried all these things and are unable to have a bowel movement in the first 3-4 days after surgery call either your surgeon or your primary doctor.   ° °If you experience loose stools or diarrhea, hold the medications until you stool forms back up.  If your symptoms do not get better within 1 week or if they get worse, check with your doctor.  If you experience "the worst abdominal pain ever" or develop nausea or vomiting, please contact the office immediately for further recommendations for treatment. ° ° °ITCHING:  If you experience itching with your medications, try taking only a single pain pill, or even half a pain pill at a time.  You can also use Benadryl over the counter for itching or also to   help with sleep.   TED HOSE STOCKINGS:  Use stockings on both legs until for at least 2 weeks or as directed by physician office. They may be removed at night for sleeping.  MEDICATIONS:  See your medication summary on the After Visit Summary that nursing will review with you.  You may have some home medications which will be placed on hold until you complete the course of blood thinner medication.  It is important for you to complete the blood thinner medication as prescribed.  PRECAUTIONS:  If you experience chest pain or shortness of breath - call 911 immediately for transfer to the hospital emergency department.   If you develop a fever greater that 101 F, purulent drainage from wound, increased redness or drainage from wound, foul odor from the wound/dressing, or calf pain - CONTACT YOUR SURGEON.                                                   FOLLOW-UP APPOINTMENTS:  If you do not already have a post-op appointment, please call the office for an appointment to be seen by your surgeon.  Guidelines for how soon to be seen are listed in your After Visit Summary, but are typically between 1-4 weeks after surgery.   MAKE SURE YOU:   Understand these  instructions.   Get help right away if you are not doing well or get worse.    Thank you for letting us be a part of your medical care team.  It is a privilege we respect greatly.  We hope these instructions will help you stay on track for a fast and full recovery!    Information on my medicine - XARELTO (Rivaroxaban)  This medication education was reviewed with me or my healthcare representative as part of my discharge preparation.  The pharmacist that spoke with me during my hospital stay was:  Luiz Ochoa [facility]  Why was Xarelto prescribed for you? Xarelto was prescribed for you to reduce the risk of blood clots forming after orthopedic surgery. The medical term for these abnormal blood clots is venous thromboembolism (VTE).  What do you need to know about xarelto ? Take your Xarelto ONCE DAILY at the same time every day. You may take it either with or without food.  If you have difficulty swallowing the tablet whole, you may crush it and mix in applesauce just prior to taking your dose.  Take Xarelto exactly as prescribed by your doctor and DO NOT stop taking Xarelto without talking to the doctor who prescribed the medication.  Stopping without other VTE prevention medication to take the place of Xarelto may increase your risk of developing a clot.  After discharge, you should have regular check-up appointments with your healthcare provider that is prescribing your Xarelto.    What do you do if you miss a dose? If you miss a dose, take it as soon as you remember on the same day then continue your regularly scheduled once daily regimen the next day. Do not take two doses of Xarelto on the same day.   Important Safety Information A possible side effect of Xarelto is bleeding. You should call your healthcare provider right away if you experience any of the following: ? Bleeding from an injury or your nose that does not stop. ? Unusual colored urine (red  or dark brown) or  unusual colored stools (red or black). ? Unusual bruising for unknown reasons. ? A serious fall or if you hit your head (even if there is no bleeding).  Some medicines may interact with Xarelto and might increase your risk of bleeding while on Xarelto. To help avoid this, consult your healthcare provider or pharmacist prior to using any new prescription or non-prescription medications, including herbals, vitamins, non-steroidal anti-inflammatory drugs (NSAIDs) and supplements.  This website has more information on Xarelto: [email or web address]

## 2015-03-31 NOTE — Care Management Note (Signed)
Case Management Note  Patient Details  Name: Edward [name] MRN: [id number] Date of Birth: 10-Sep-1950  Subjective/Objective:                   RIGHT TOTAL KNEE ARTHROPLASTY (Right) Action/Plan:  Discharge planning Expected Discharge Date:  04/01/15               Expected Discharge Plan:  [place]  In-House Referral:     Discharge planning Services  CM Consult  Post Acute Care Choice:  Home Health Choice offered to:  Patient  DME Arranged:  N/A DME Agency:  NA  HH Arranged:  PT HH Agency:  [place]  Status of Service:  Completed, signed off  Medicare Important Message Given:    Date Medicare IM Given:    Medicare IM give by:    Date Additional Medicare IM Given:    Additional Medicare Important Message give by:     If discussed at [place] of Stay Meetings, dates discussed:    Additional Comments: CM met with pt to offer choice of home health agency.  Pt chooses [facility] to render HHPT.  Pt will be staying at his sister's, Edward [name] house: [place], [place] [address].  Referral given to [facility] rep, Tim (on unit) with appropriate address.  No DME is needed.  No other CM needs were communicated. Dellie Catholic, RN 03/31/2015, 12:04 PM

## 2015-03-31 NOTE — Progress Notes (Signed)
Physical Therapy Treatment Patient Details Name: Edward [name] MRN: [id number] DOB: 03/04/1951 Today's Date: 03/31/2015    History of Present Illness s/p R TKA    PT Comments    POD # 1 pm session.  Applied KI and instructed on use for amb.  Assisted with amb in hallway an increased distance.  Assisted to bathroom to void.  Assisted to recliner and performed all supine TKR TE's followed by ICE.    Follow Up Recommendations  Home health PT     Equipment Recommendations  None recommended by PT    Recommendations for Other Services       Precautions / Restrictions Precautions Precautions: Knee Precaution Comments: instructed on KI use for amb/stairs Required Braces or Orthoses: Knee Immobilizer - Right Knee Immobilizer - Right: Discontinue once straight leg raise with < 10 degree lag Restrictions Weight Bearing Restrictions: No Other Position/Activity Restrictions: WBAT    Mobility  Bed Mobility               General bed mobility comments: OOB in recliner  Transfers Overall transfer level: Needs assistance Equipment used: Rolling walker (2 wheeled) Transfers: Sit to/from Stand Sit to Stand: Supervision;Min guard         General transfer comment: one VC safety with turns/safety  Ambulation/Gait Ambulation/Gait assistance: Supervision;Min guard Ambulation Distance (Feet): 55 Feet Assistive device: Rolling walker (2 wheeled) Gait Pattern/deviations: Step-to pattern;Decreased stance time - right;Trunk flexed     General Gait Details: cues for sequence, RW distance from self   Stairs            Wheelchair Mobility    Modified Rankin (Stroke Patients Only)       Balance Overall balance assessment: Needs assistance Sitting-balance support: Feet supported Sitting balance-Leahy Scale: Good     Standing balance support: Bilateral upper extremity supported;During functional activity Standing balance-Leahy Scale: Poor Standing balance comment: rw  for balance                    Cognition Arousal/Alertness: Awake/alert Behavior During Therapy: WFL for tasks assessed/performed Overall Cognitive Status: Within Functional Limits for tasks assessed                      Exercises   Total Knee Replacement TE's 10 reps B LE ankle pumps 10 reps towel squeezes 10 reps knee presses 05 reps heel slides AAROM 10 reps SLR's AAROM 10 reps ABD AAROM Followed by ICE     General Comments        Pertinent Vitals/Pain Pain Assessment: 0-10 Pain Score: 6  Pain Location: R knee Pain Descriptors / Indicators: Aching;Grimacing;Sore;Tender Pain Intervention(s): Monitored during session;Repositioned;Ice applied;RN gave pain meds during session    [place] expects to be discharged to:: Private residence Living Arrangements: Alone Available Help at Discharge: Family Type of Home: House Home Access: Stairs to enter   Home Layout: Two level;Able to live on main level with bedroom/bathroom Home Equipment: [place] Rile - 2 wheels;Bedside commode;Cane - single point;Adaptive equipment      Prior Function Level of Independence: Independent          PT Goals (current goals can now be found in the care plan section) Acute Rehab PT Goals Patient Stated Goal: back to playing golf Progress towards PT goals: Progressing toward goals    Frequency  7X/week    PT Plan Current plan remains appropriate    Co-evaluation  End of Session Equipment Utilized During Treatment: Gait belt Activity Tolerance: Patient tolerated treatment well Patient left: in chair;with call bell/phone within reach     Time: 1410-1435 PT Time Calculation (min) (ACUTE ONLY): 25 min  Charges:  $Gait Training: 8-22 mins $Therapeutic Exercise: 8-22 mins                    G Codes:      Rica Koyanagi  PTA [facility]  Acute  Rehab Pager      [phone]

## 2015-03-31 NOTE — Progress Notes (Signed)
Occupational Therapy Evaluation Patient Details Name: Edward [name] MRN: [id number] DOB: May 25, 1951 Today's Date: 03/31/2015    History of Present Illness s/p R TKA   Clinical Impression   Pt admitted with the above diagnoses and presents with below problem list. Pt will benefit from continued acute OT to address the below listed deficits and maximize independence with BADLs prior to d/c to venue below. PTA pt was independent with ADLs. Pt is currently min guard to min A for LB ADLs and toilet/shower transfers. OT to continue to follow acutely.      Follow Up Recommendations  Supervision/Assistance - 24 hour;No OT follow up    Equipment Recommendations  None recommended by OT    Recommendations for Other Services       Precautions / Restrictions Precautions Precautions: Knee Required Braces or Orthoses: Knee Immobilizer - Right Knee Immobilizer - Right: Discontinue once straight leg raise with < 10 degree lag Restrictions Weight Bearing Restrictions: Yes Other Position/Activity Restrictions: WBAT      Mobility Bed Mobility Overal bed mobility: Needs Assistance Bed Mobility: Supine to Sit     Supine to sit: Min assist     General bed mobility comments: in recliner; discussed AE for advancing RLE onto bed if needed  Transfers Overall transfer level: Needs assistance Equipment used: Rolling walker (2 wheeled) Transfers: Sit to/from Stand Sit to Stand: Min guard         General transfer comment: from recliner, 3n1; cues for technique, no physical assist    Balance Overall balance assessment: Needs assistance Sitting-balance support: Feet supported Sitting balance-Leahy Scale: Good     Standing balance support: Bilateral upper extremity supported;During functional activity Standing balance-Leahy Scale: Poor Standing balance comment: rw for balance                            ADL Overall ADL's : Needs assistance/impaired Eating/Feeding: Set  up;Sitting   Grooming: Min guard;Standing;Supervision/safety;Wash/dry hands Grooming Details (indicate cue type and reason): cued to stand in front of sink not at an angle Upper Body Bathing: Set up;Sitting   Lower Body Bathing: Minimal assistance;Sit to/from stand   Upper Body Dressing : Set up;Sitting   Lower Body Dressing: Minimal assistance;Sit to/from stand   Toilet Transfer: Min guard;Ambulation;BSC;RW Toilet Transfer Details (indicate cue type and reason): 3n1 over toilet Toileting- Clothing Manipulation and Hygiene: Min guard;Sit to/from stand   Tub/ Shower Transfer: Min guard;Walk-in shower;Ambulation;Shower seat;3 in Mudlogger Details (indicate cue type and reason): discussed built-in shower seat vs 3n1 in regards to height and arm rest Functional mobility during ADLs: Min guard;Rolling walker General ADL Comments: Pt completed toilet transfer, functional mobility and grooming as detailed above. Educated on AE and compensatory strategies for ADLs and safety with home setup.     Vision     Perception     Praxis      Pertinent Vitals/Pain Pain Assessment: 0-10 Pain Score: 5  Pain Location: right knee Pain Descriptors / Indicators: Sore Pain Intervention(s): Limited activity within patient's tolerance;Monitored during session;Repositioned;Ice applied     Hand Dominance     Extremity/Trunk Assessment Upper Extremity Assessment Upper Extremity Assessment: Overall WFL for tasks assessed   Lower Extremity Assessment Lower Extremity Assessment: Defer to PT evaluation RLE Deficits / Details: ankle WFL, SLR with assist, too painful to test further RLE: Unable to fully assess due to pain       Communication Communication Communication: No difficulties  Cognition Arousal/Alertness: Awake/alert Behavior During Therapy: WFL for tasks assessed/performed Overall Cognitive Status: Within Functional Limits for tasks assessed                      General Comments       Exercises       Shoulder Instructions      Home Living Family/patient expects to be discharged to:: Private residence Living Arrangements: Alone Available Help at Discharge: Family Type of Home: House Home Access: Stairs to enter [employer] of Steps: 2   Home Layout: Two level;Able to live on main level with bedroom/bathroom     Bathroom Shower/Tub: Occupational psychologist: Standard Bathroom Accessibility: Yes How Accessible: Accessible via walker Home Equipment: [place] - 2 wheels;Bedside commode;Cane - single point;Adaptive equipment Adaptive Equipment: Reacher        Prior Functioning/Environment Level of Independence: Independent             OT Diagnosis: Acute pain   OT Problem List: Impaired balance (sitting and/or standing);Decreased knowledge of use of DME or AE;Decreased knowledge of precautions;Pain   OT Treatment/Interventions: Self-care/ADL training;DME and/or AE instruction;Therapeutic activities;Patient/family education;Balance training    OT Goals(Current goals can be found in the care plan section) Acute Rehab OT Goals Patient Stated Goal: back to playing golf OT Goal Formulation: With patient Time For Goal Achievement: 04/07/15 Potential to Achieve Goals: Good ADL Goals Pt Will Perform Grooming: with modified independence;standing Pt Will Perform Lower Body Bathing: with modified independence;with adaptive equipment;sit to/from stand Pt Will Perform Lower Body Dressing: with modified independence;with adaptive equipment;sit to/from stand Pt Will Perform Tub/Shower Transfer: Shower transfer;ambulating;3 in 1;shower seat;rolling walker Additional ADL Goal #1: Pt will complete bed mobility at mod I level to prepare for OOB ADLs.   OT Frequency: Min 2X/week   Barriers to D/C:            Co-evaluation              End of Session Equipment Utilized During Treatment: Rolling  walker;Right knee immobilizer  Activity Tolerance: Patient tolerated treatment well Patient left: in chair;with call bell/phone within reach;with family/visitor present   Time: 1040-1105 OT Time Calculation (min): 25 min Charges:  OT General Charges $OT Visit: 1 Procedure OT Evaluation $Initial OT Evaluation Tier I: 1 Procedure OT Treatments $Self Care/Home Management : 8-22 mins G-Codes:    Hortencia Pilar 2015/04/06, 11:21 AM

## 2015-03-31 NOTE — Progress Notes (Signed)
Subjective: 1 Day Post-Op Procedure(s) (LRB): RIGHT TOTAL KNEE ARTHROPLASTY (Right) Patient reports pain as 5 on 0-10 scale. Doing well today. Hemovac DCd. Will ambulate.   Objective: Vital signs in last 24 hours: Temp:  [97.6 F (36.4 C)-99.1 F (37.3 C)] 99.1 F (37.3 C) (09/21 0623) Pulse Rate:  [60-81] 81 (09/21 0623) Resp:  [11-18] 16 (09/21 0623) BP: (107-146)/(47-82) 127/66 mmHg (09/21 0623) SpO2:  [95 %-100 %] 97 % (09/21 0623) Weight:  [87.714 kg (193 lb 6 oz)] 87.714 kg (193 lb 6 oz) (09/20 0959)  Intake/Output from previous day: 09/20 0701 - 09/21 0700 In: 3832.2 [P.O.:720; I.V.:2957.2; IV Piggyback:155] Out: 2815 [Urine:2685; Drains:130] Intake/Output this shift:     Recent Labs  03/31/15 0420  HGB 12.0*    Recent Labs  03/31/15 0420  WBC 9.8  RBC 3.98*  HCT 35.2*  PLT 141*    Recent Labs  03/31/15 0420  NA 139  K 3.9  CL 103  CO2 29  BUN 9  CREATININE 0.57*  GLUCOSE 125*  CALCIUM 8.8*   No results for input(s): LABPT, INR in the last 72 hours.  Neurologically intact Dorsiflexion/Plantar flexion intact Compartment soft  Assessment/Plan: 1 Day Post-Op Procedure(s) (LRB): RIGHT TOTAL KNEE ARTHROPLASTY (Right) Up with therapy  GIOFFRE,RONALD A 03/31/2015, 7:58 AM

## 2015-04-01 LAB — CBC
HCT: 33.3 % — ABNORMAL LOW (ref 39.0–52.0)
Hemoglobin: 11.3 g/dL — ABNORMAL LOW (ref 13.0–17.0)
MCH: 30.6 pg (ref 26.0–34.0)
MCHC: 33.9 g/dL (ref 30.0–36.0)
MCV: 90.2 fL (ref 78.0–100.0)
PLATELETS: 128 10*3/uL — AB (ref 150–400)
RBC: 3.69 MIL/uL — ABNORMAL LOW (ref 4.22–5.81)
RDW: 13 % (ref 11.5–15.5)
WBC: 9 10*3/uL (ref 4.0–10.5)

## 2015-04-01 LAB — BASIC METABOLIC PANEL
Anion gap: 5 (ref 5–15)
BUN: 10 mg/dL (ref 6–20)
CALCIUM: 8.8 mg/dL — AB (ref 8.9–10.3)
CHLORIDE: 104 mmol/L (ref 101–111)
CO2: 31 mmol/L (ref 22–32)
CREATININE: 0.71 mg/dL (ref 0.61–1.24)
GFR calc Af Amer: >60 mL/min (ref >60)
Glucose, Bld: 108 mg/dL — ABNORMAL HIGH (ref 65–99)
Potassium: 4.4 mmol/L (ref 3.5–5.1)
SODIUM: 140 mmol/L (ref 135–145)

## 2015-04-01 NOTE — Progress Notes (Signed)
Occupational Therapy Treatment Patient Details Name: Edward [name] MRN: [id number] DOB: 05/28/1951 Today's Date: 04/01/2015    History of present illness s/p R TKA   OT comments  Pt progressing well towards acute OT goals. Focus of session was standing tolerance during grooming task and toilet/shower transfers. ADL education and home setup safety reviewed. D/c plan remains appropriate.   Follow Up Recommendations  Supervision/Assistance - 24 hour;No OT follow up    Equipment Recommendations  None recommended by OT    Recommendations for Other Services      Precautions / Restrictions Precautions Precautions: Knee Required Braces or Orthoses: Knee Immobilizer - Right Knee Immobilizer - Right: Discontinue once straight leg raise with < 10 degree lag Restrictions Weight Bearing Restrictions: Yes       Mobility Bed Mobility                  Transfers Overall transfer level: Needs assistance Equipment used: Rolling walker (2 wheeled) Transfers: Sit to/from Stand Sit to Stand: Supervision         General transfer comment: from recliner and 3n1    Balance Overall balance assessment: Needs assistance         Standing balance support: Bilateral upper extremity supported;During functional activity Standing balance-Leahy Scale: Poor Standing balance comment: washed hands standing at sink with no LOB                   ADL Overall ADL's : Needs assistance/impaired     Grooming: Wash/dry hands;Supervision/safety;Standing                   Toilet Transfer: Supervision/safety;Ambulation;BSC;RW       Tub/ Shower Transfer: Gaffer;Ambulation;3 in 1;Rolling walker   Functional mobility during ADLs: Supervision/safety;Rolling walker General ADL Comments: Pt completed household distance ambulation, grooming, and toilet/shower transfer as detailed above. Reviewed ADL educaiton.       Vision                     Perception      Praxis      Cognition   Behavior During Therapy: WFL for tasks assessed/performed Overall Cognitive Status: Within Functional Limits for tasks assessed                       Extremity/Trunk Assessment               Exercises     Shoulder Instructions       General Comments      Pertinent Vitals/ Pain       Pain Assessment: 0-10 Pain Score: 5  Pain Location: R knee Pain Descriptors / Indicators: Grimacing Pain Intervention(s): Limited activity within patient's tolerance;Monitored during session;Repositioned  Home Living                                          Prior Functioning/Environment              Frequency Min 2X/week     Progress Toward Goals  OT Goals(current goals can now be found in the care plan section)  Progress towards OT goals: Progressing toward goals  Acute Rehab OT Goals Patient Stated Goal: back to playing golf OT Goal Formulation: With patient Time For Goal Achievement: 04/07/15 Potential to Achieve Goals: Good ADL Goals Pt Will Perform Grooming: with modified independence;standing Pt  Will Perform Lower Body Bathing: with modified independence;with adaptive equipment;sit to/from stand Pt Will Perform Lower Body Dressing: with modified independence;with adaptive equipment;sit to/from stand Pt Will Perform Tub/Shower Transfer: Shower transfer;ambulating;3 in 1;shower seat;rolling walker Additional ADL Goal #1: Pt will complete bed mobility at mod I level to prepare for OOB ADLs.   Plan Discharge plan remains appropriate    Co-evaluation                 End of Session Equipment Utilized During Treatment: Rolling walker;Right knee immobilizer   Activity Tolerance Patient tolerated treatment well   Patient Left in chair;with call bell/phone within reach   Nurse Communication          Time: 9373-4287 OT Time Calculation (min): 13 min  Charges: OT General Charges $OT Visit: 1 Procedure OT  Treatments $Self Care/Home Management : 8-22 mins  Hortencia Pilar 04/01/2015, 12:08 PM

## 2015-04-01 NOTE — Care Management Important Message (Signed)
Important Message  Patient Details  Name: Edward [name] MRN: [id number] Date of Birth: Oct 29, 1950   Medicare Important Message Given:  [facility] notification given    Camillo Flaming 04/01/2015, 11:39 AMImportant Message  Patient Details  Name: Edward [name] MRN: [id number] Date of Birth: Jul 19, 1950   Medicare Important Message Given:  Yes-second notification given    Camillo Flaming 04/01/2015, 11:39 AM

## 2015-04-01 NOTE — Progress Notes (Signed)
   Subjective: 2 Days Post-Op Procedure(s) (LRB): RIGHT TOTAL KNEE ARTHROPLASTY (Right) Patient reports pain as mild.   Patient seen in rounds for Dr. Gladstone Lighter Patient is well, and has had no acute complaints or problems. No issues overnight. He reports that his pain is under much better control. Voiding well. Positive flatus.   Objective: Vital signs in last 24 hours: Temp:  [98.6 F (37 C)-99.5 F (37.5 C)] 98.6 F (37 C) (09/22 0629) Pulse Rate:  [69-70] 70 (09/22 0629) Resp:  [16-18] 16 (09/22 0629) BP: (115-125)/(56-71) 123/56 mmHg (09/22 0629) SpO2:  [93 %-97 %] 96 % (09/22 0629)  Intake/Output from previous day:  Intake/Output Summary (Last 24 hours) at 04/01/15 0744 Last data filed at 04/01/15 0630  Gross per 24 hour  Intake    840 ml  Output   1925 ml  Net  -1085 ml     Labs:  Recent Labs  03/31/15 0420 04/01/15 0549  HGB 12.0* 11.3*    Recent Labs  03/31/15 0420 04/01/15 0549  WBC 9.8 9.0  RBC 3.98* 3.69*  HCT 35.2* 33.3*  PLT 141* 128*    Recent Labs  03/31/15 0420 04/01/15 0549  NA 139 140  K 3.9 4.4  CL 103 104  CO2 29 31  BUN 9 10  CREATININE 0.57* 0.71  GLUCOSE 125* 108*  CALCIUM 8.8* 8.8*    EXAM General - Patient is Alert and Oriented Extremity - Neurologically intact Dorsiflexion/Plantar flexion intact No cellulitis present Compartment soft Dressing/Incision - clean, dry, no drainage Motor Function - intact, moving foot and toes well on exam.   Past Medical History  Diagnosis Date  . Hypertension   . CAD (coronary artery disease)     with high grade obstruction second obtuse marginal and diffuse LAD and circumflex disease by cath 2010  . Erectile dysfunction   . Elevated cholesterol     LDL Goal < 70  . History of nuclear stress test 5/10    no ischemia  . Colon polyp   . Anxiety     claustrophobic  . Cancer     skin cancer, on R ear - basal cell   . Arthritis     throughout arms, knees & back & neck  . Anginal  pain     last NTG 3-4 months ago  . History of nonmelanoma skin cancer   . GERD (gastroesophageal reflux disease)     has not had problem several months    Assessment/Plan: 2 Days Post-Op Procedure(s) (LRB): RIGHT TOTAL KNEE ARTHROPLASTY (Right) Active Problems:   History of total knee arthroplasty  Estimated body mass index is 28.54 kg/(m^2) as calculated from the following:   Height as of this encounter: 5\' 9"  (1.753 m).   Weight as of this encounter: 87.714 kg (193 lb 6 oz). Advance diet Up with therapy D/C IV fluids Discharge home with home health  DVT Prophylaxis - Xarelto Weight-Bearing as tolerated   Ardeen Jourdain, PA-C Orthopaedic Surgery 04/01/2015, 7:44 AM

## 2015-04-01 NOTE — Progress Notes (Signed)
Physical Therapy Treatment Patient Details Name: Edward [name] MRN: [id number] DOB: 13-Nov-1950 Today's Date: 04/01/2015    History of Present Illness s/p R TKA    PT Comments    POD # 2 am session.  Applied KI and instructed on use for amb and esp stairs.  Assisted with amb in hallway, practiced going up/down stairs then returned to room to perform TKR TE's following HEP handout.  Instructed on proper tech and use of ICE.  All mobility questions addressed.  Pt ready for D/C to home.   Follow Up Recommendations  Home health PT     Equipment Recommendations  None recommended by PT    Recommendations for Other Services       Precautions / Restrictions Precautions Precautions: Knee Precaution Comments: instructed on KI use for amb/stairs Required Braces or Orthoses: Knee Immobilizer - Right Knee Immobilizer - Right: Discontinue once straight leg raise with < 10 degree lag Restrictions Weight Bearing Restrictions: Yes Other Position/Activity Restrictions: WBAT    Mobility  Bed Mobility Overal bed mobility: Needs Assistance Bed Mobility: Sit to Supine     Supine to sit: Min guard     General bed mobility comments: Pt able to get R LE up onto bed   Transfers Overall transfer level: Needs assistance Equipment used: Rolling walker (2 wheeled) Transfers: Sit to/from Stand Sit to Stand: Supervision         General transfer comment: good Academic librarian  Ambulation/Gait Ambulation/Gait assistance: Supervision Ambulation Distance (Feet): 125 Feet Assistive device: Rolling walker (2 wheeled) Gait Pattern/deviations: Step-to pattern;Decreased stance time - right     General Gait Details: one VC safety with turns and proper walker to self distance   Stairs Stairs: Yes Stairs assistance: Min guard Stair Management: Step to pattern;No rails;Forwards;With walker Number of Stairs: 3 General stair comments: up forward 3 short steps with walker and 25% VC's on safety and  proper sequencing.    Wheelchair Mobility    Modified Rankin (Stroke Patients Only)       Balance Overall balance assessment: Needs assistance         Standing balance support: Bilateral upper extremity supported;During functional activity Standing balance-Leahy Scale: Poor Standing balance comment: washed hands standing at sink with no LOB                    Cognition Arousal/Alertness: Awake/alert Behavior During Therapy: WFL for tasks assessed/performed Overall Cognitive Status: Within Functional Limits for tasks assessed                      Exercises   Total Knee Replacement TE's 10 reps B LE ankle pumps 10 reps towel squeezes 10 reps knee presses 10 reps heel slides  10 reps SAQ's 10 reps SLR's 10 reps ABD Followed by ICE     General Comments        Pertinent Vitals/Pain Pain Assessment: 0-10 Pain Score: 8  Pain Location: R knee Pain Descriptors / Indicators: Sore;Tender Pain Intervention(s): Monitored during session;Premedicated before session;Repositioned;Ice applied    Home Living                      Prior Function            PT Goals (current goals can now be found in the care plan section) Acute Rehab PT Goals Patient Stated Goal: back to playing golf Progress towards PT goals: Progressing toward goals    Frequency  7X/week  PT Plan Current plan remains appropriate    Co-evaluation             End of Session Equipment Utilized During Treatment: Gait belt Activity Tolerance: Patient tolerated treatment well Patient left: in bed;with call bell/phone within reach     Time: 0945-1030 PT Time Calculation (min) (ACUTE ONLY): 45 min  Charges:  $Gait Training: 8-22 mins $Therapeutic Exercise: 8-22 mins $Therapeutic Activity: 8-22 mins                    G Codes:      Rica Koyanagi  PTA [facility]  Acute  Rehab Pager      [phone]

## 2015-04-01 NOTE — Plan of Care (Signed)
Problem: Phase III Progression Outcomes Goal: Anticoagulant follow-up in place Outcome: Not Applicable Date Met:  21/11/73 Xarelto VTE, no f/u needed.

## 2015-04-05 NOTE — Discharge Summary (Signed)
Physician Discharge Summary   Patient ID: NATANIEL GASPER MRN: [id number] DOB/AGE: Mar 09, 1951 64 y.o.  Admit date: 03/30/2015 Discharge date: 04/01/2015  Primary Diagnosis: Primary osteoarthritis right knee   Admission Diagnoses:  Past Medical History  Diagnosis Date  . Hypertension   . CAD (coronary artery disease)     with high grade obstruction second obtuse marginal and diffuse LAD and circumflex disease by cath 2010  . Erectile dysfunction   . Elevated cholesterol     LDL Goal < 70  . History of nuclear stress test 5/10    no ischemia  . Colon polyp   . Anxiety     claustrophobic  . Cancer     skin cancer, on R ear - basal cell   . Arthritis     throughout arms, knees & back & neck  . Anginal pain     last NTG 3-4 months ago  . History of nonmelanoma skin cancer   . GERD (gastroesophageal reflux disease)     has not had problem several months   Discharge Diagnoses:   Active Problems:   History of total knee arthroplasty  Estimated body mass index is 28.54 kg/(m^2) as calculated from the following:   Height as of this encounter: '5\' 9"'  (1.753 m).   Weight as of this encounter: 87.714 kg (193 lb 6 oz).  Procedure:  Procedure(s) (LRB): RIGHT TOTAL KNEE ARTHROPLASTY (Right)   Consults: None  HPI: Cala Bradford, 64 y.o. male, has a history of pain and functional disability in the right knee due to arthritis and has failed non-surgical conservative treatments for greater than 12 weeks to includeNSAID's and/or analgesics, corticosteriod injections and activity modification. Onset of symptoms was gradual, starting 4 years ago with gradually worsening course since that time. The patient noted prior procedures on the knee to include arthroscopy and menisectomy on the right knee(s). Patient currently rates pain in the right knee(s) at 7 out of 10 with activity. Patient has night pain, worsening of pain with activity and weight bearing, pain that interferes with activities of  daily living, pain with passive range of motion, crepitus and joint swelling. Patient has evidence of periarticular osteophytes and joint space narrowing by imaging studies. There is no active infection.  Laboratory Data: Admission on 03/30/2015, Discharged on 04/01/2015  Component Date Value Ref Range Status  . WBC 03/31/2015 9.8  4.0 - 10.5 K/uL Final  . RBC 03/31/2015 3.98* 4.22 - 5.81 MIL/uL Final  . Hemoglobin 03/31/2015 12.0* 13.0 - 17.0 g/dL Final  . HCT 03/31/2015 35.2* 39.0 - 52.0 % Final  . MCV 03/31/2015 88.4  78.0 - 100.0 fL Final  . MCH 03/31/2015 30.2  26.0 - 34.0 pg Final  . MCHC 03/31/2015 34.1  30.0 - 36.0 g/dL Final  . RDW 03/31/2015 12.7  11.5 - 15.5 % Final  . Platelets 03/31/2015 141* 150 - 400 K/uL Final  . Sodium 03/31/2015 139  135 - 145 mmol/L Final  . Potassium 03/31/2015 3.9  3.5 - 5.1 mmol/L Final  . Chloride 03/31/2015 103  101 - 111 mmol/L Final  . CO2 03/31/2015 29  22 - 32 mmol/L Final  . Glucose, Bld 03/31/2015 125* 65 - 99 mg/dL Final  . BUN 03/31/2015 9  6 - 20 mg/dL Final  . Creatinine, Ser 03/31/2015 0.57* 0.61 - 1.24 mg/dL Final  . Calcium 03/31/2015 8.8* 8.9 - 10.3 mg/dL Final  . GFR calc non Af Amer 03/31/2015 >60  >60 mL/min Final  .  GFR calc Af Amer 03/31/2015 >60  >60 mL/min Final   Comment: (NOTE) The eGFR has been calculated using the CKD EPI equation. This calculation has not been validated in all clinical situations. eGFR's persistently <60 mL/min signify possible Chronic Kidney Disease.   . Anion gap 03/31/2015 7  5 - 15 Final  . WBC 04/01/2015 9.0  4.0 - 10.5 K/uL Final  . RBC 04/01/2015 3.69* 4.22 - 5.81 MIL/uL Final  . Hemoglobin 04/01/2015 11.3* 13.0 - 17.0 g/dL Final  . HCT 04/01/2015 33.3* 39.0 - 52.0 % Final  . MCV 04/01/2015 90.2  78.0 - 100.0 fL Final  . MCH 04/01/2015 30.6  26.0 - 34.0 pg Final  . MCHC 04/01/2015 33.9  30.0 - 36.0 g/dL Final  . RDW 04/01/2015 13.0  11.5 - 15.5 % Final  . Platelets 04/01/2015 128* 150  - 400 K/uL Final  . Sodium 04/01/2015 140  135 - 145 mmol/L Final  . Potassium 04/01/2015 4.4  3.5 - 5.1 mmol/L Final  . Chloride 04/01/2015 104  101 - 111 mmol/L Final  . CO2 04/01/2015 31  22 - 32 mmol/L Final  . Glucose, Bld 04/01/2015 108* 65 - 99 mg/dL Final  . BUN 04/01/2015 10  6 - 20 mg/dL Final  . Creatinine, Ser 04/01/2015 0.71  0.61 - 1.24 mg/dL Final  . Calcium 04/01/2015 8.8* 8.9 - 10.3 mg/dL Final  . GFR calc non Af Amer 04/01/2015 >60  >60 mL/min Final  . GFR calc Af Amer 04/01/2015 >60  >60 mL/min Final   Comment: (NOTE) The eGFR has been calculated using the CKD EPI equation. This calculation has not been validated in all clinical situations. eGFR's persistently <60 mL/min signify possible Chronic Kidney Disease.   Georgiann Hahn gap 04/01/2015 5  5 - 15 Final  Hospital Outpatient Visit on 03/23/2015  Component Date Value Ref Range Status  . MRSA, PCR 03/23/2015 NEGATIVE  NEGATIVE Final  . Staphylococcus aureus 03/23/2015 NEGATIVE  NEGATIVE Final   Comment:        The Xpert SA Assay (FDA approved for NASAL specimens in patients over 59 years of age), is one component of a comprehensive surveillance program.  Test performance has been validated by [facility] for patients greater than or equal to 74 year old. It is not intended to diagnose infection nor to guide or monitor treatment.   Marland Kitchen aPTT 03/23/2015 26  24 - 37 seconds Final  . WBC 03/23/2015 5.4  4.0 - 10.5 K/uL Final  . RBC 03/23/2015 4.40  4.22 - 5.81 MIL/uL Final  . Hemoglobin 03/23/2015 13.3  13.0 - 17.0 g/dL Final  . HCT 03/23/2015 38.7* 39.0 - 52.0 % Final  . MCV 03/23/2015 88.0  78.0 - 100.0 fL Final  . MCH 03/23/2015 30.2  26.0 - 34.0 pg Final  . MCHC 03/23/2015 34.4  30.0 - 36.0 g/dL Final  . RDW 03/23/2015 12.6  11.5 - 15.5 % Final  . Platelets 03/23/2015 165  150 - 400 K/uL Final  . Neutrophils Relative % 03/23/2015 65  43 - 77 % Final  . Neutro Abs 03/23/2015 3.5  1.7 - 7.7 K/uL Final  .  Lymphocytes Relative 03/23/2015 24  12 - 46 % Final  . Lymphs Abs 03/23/2015 1.3  0.7 - 4.0 K/uL Final  . Monocytes Relative 03/23/2015 9  3 - 12 % Final  . Monocytes Absolute 03/23/2015 0.5  0.1 - 1.0 K/uL Final  . Eosinophils Relative 03/23/2015 2  0 - 5 %  Final  . Eosinophils Absolute 03/23/2015 0.1  0.0 - 0.7 K/uL Final  . Basophils Relative 03/23/2015 0  0 - 1 % Final  . Basophils Absolute 03/23/2015 0.0  0.0 - 0.1 K/uL Final  . Sodium 03/23/2015 141  135 - 145 mmol/L Final  . Potassium 03/23/2015 4.6  3.5 - 5.1 mmol/L Final  . Chloride 03/23/2015 107  101 - 111 mmol/L Final  . CO2 03/23/2015 29  22 - 32 mmol/L Final  . Glucose, Bld 03/23/2015 92  65 - 99 mg/dL Final  . BUN 03/23/2015 14  6 - 20 mg/dL Final  . Creatinine, Ser 03/23/2015 0.73  0.61 - 1.24 mg/dL Final  . Calcium 03/23/2015 9.2  8.9 - 10.3 mg/dL Final  . Total Protein 03/23/2015 6.6  6.5 - 8.1 g/dL Final  . Albumin 03/23/2015 4.2  3.5 - 5.0 g/dL Final  . AST 03/23/2015 22  15 - 41 U/L Final  . ALT 03/23/2015 18  17 - 63 U/L Final  . Alkaline Phosphatase 03/23/2015 49  38 - 126 U/L Final  . Total Bilirubin 03/23/2015 0.9  0.3 - 1.2 mg/dL Final  . GFR calc non Af Amer 03/23/2015 >60  >60 mL/min Final  . GFR calc Af Amer 03/23/2015 >60  >60 mL/min Final   Comment: (NOTE) The eGFR has been calculated using the CKD EPI equation. This calculation has not been validated in all clinical situations. eGFR's persistently <60 mL/min signify possible Chronic Kidney Disease.   . Anion gap 03/23/2015 5  5 - 15 Final  . Prothrombin Time 03/23/2015 13.9  11.6 - 15.2 seconds Final  . INR 03/23/2015 1.05  0.00 - 1.49 Final  . ABO/RH(D) 03/23/2015 O POS   Final  . Antibody Screen 03/23/2015 NEG   Final  . Sample Expiration 03/23/2015 04/02/2015   Final  . Color, Urine 03/23/2015 YELLOW  YELLOW Final  . APPearance 03/23/2015 CLEAR  CLEAR Final  . Specific Gravity, Urine 03/23/2015 1.022  1.005 - 1.030 Final  . pH 03/23/2015 5.0   5.0 - 8.0 Final  . Glucose, UA 03/23/2015 NEGATIVE  NEGATIVE mg/dL Final  . Hgb urine dipstick 03/23/2015 NEGATIVE  NEGATIVE Final  . Bilirubin Urine 03/23/2015 NEGATIVE  NEGATIVE Final  . Ketones, ur 03/23/2015 NEGATIVE  NEGATIVE mg/dL Final  . Protein, ur 03/23/2015 NEGATIVE  NEGATIVE mg/dL Final  . Urobilinogen, UA 03/23/2015 1.0  0.0 - 1.0 mg/dL Final  . Nitrite 03/23/2015 NEGATIVE  NEGATIVE Final  . Leukocytes, UA 03/23/2015 NEGATIVE  NEGATIVE Final   MICROSCOPIC NOT DONE ON URINES WITH NEGATIVE PROTEIN, BLOOD, LEUKOCYTES, NITRITE, OR GLUCOSE <1000 mg/dL.  . ABO/RH(D) 03/23/2015 O POS   Final     X-Rays:Dg Chest 2 View  03/23/2015   CLINICAL DATA:  Hypertension.  Preoperative total knee replacement  EXAM: CHEST  2 VIEW  COMPARISON:  May 27, 2013  FINDINGS: Lungs are clear. Heart size and pulmonary vascularity are normal. No adenopathy. There is postoperative change in the lower cervical spine.  IMPRESSION: No edema or consolidation.   Electronically Signed   By: Lowella Grip III M.D.   On: 03/23/2015 09:23    EKG: Orders placed or performed in visit on 02/08/15  . EKG 12-Lead     Hospital Course: XZAYVIER FAGIN is a 64 y.o. who was admitted to [facility]. They were brought to the operating room on 03/30/2015 and underwent Procedure(s): RIGHT TOTAL KNEE ARTHROPLASTY.  Patient tolerated the procedure well and was later transferred  to the recovery room and then to the orthopaedic floor for postoperative care.  They were given PO and IV analgesics for pain control following their surgery.  They were given 24 hours of postoperative antibiotics of  Anti-infectives    Start     Dose/Rate Route Frequency Ordered Stop   03/30/15 1800  ceFAZolin (ANCEF) IVPB 1 g/50 mL premix     1 g 100 mL/hr over 30 Minutes Intravenous Every 6 hours 03/30/15 1651 03/31/15 0124   03/30/15 1254  polymyxin B 500,000 Units, bacitracin 50,000 Units in sodium chloride irrigation 0.9 % 500 mL  irrigation  Status:  Discontinued       As needed 03/30/15 1254 03/30/15 1425   03/30/15 1023  ceFAZolin (ANCEF) IVPB 2 g/50 mL premix     2 g 100 mL/hr over 30 Minutes Intravenous On call to O.R. 03/30/15 1023 03/30/15 1219     and started on DVT prophylaxis in the form of Xarelto.   PT and OT were ordered for total joint protocol.  Discharge planning consulted to help with postop disposition and equipment needs.  Patient had a good night on the evening of surgery.  They started to get up OOB with therapy on day one. Hemovac drain was pulled without difficulty.  Continued to work with therapy into day two.  The patient had progressed with therapy and meeting their goals.  Patient was seen in rounds and was ready to go home.   Diet: Cardiac diet Activity:WBAT Follow-up:in 2 weeks Disposition - Home Discharged Condition: stable   Discharge Instructions    Call MD / Call 911    Complete by:  As directed   If you experience chest pain or shortness of breath, CALL 911 and be transported to the hospital emergency room.  If you develope a fever above 101 F, pus (white drainage) or increased drainage or redness at the wound, or calf pain, call your surgeon's office.     Constipation Prevention    Complete by:  As directed   Drink plenty of fluids.  Prune juice may be helpful.  You may use a stool softener, such as Colace (over the counter) 100 mg twice a day.  Use MiraLax (over the counter) for constipation as needed.     Diet - low sodium heart healthy    Complete by:  As directed      Discharge instructions    Complete by:  As directed   INSTRUCTIONS AFTER JOINT REPLACEMENT   Remove items at home which could result in a fall. This includes throw rugs or furniture in walking pathways ICE to the affected joint every three hours while awake for 30 minutes at a time, for at least the first 3-5 days, and then as needed for pain and swelling.  Continue to use ice for pain and swelling. You may  notice swelling that will progress down to the foot and ankle.  This is normal after surgery.  Elevate your leg when you are not up walking on it.   Continue to use the breathing machine you got in the hospital (incentive spirometer) which will help keep your temperature down.  It is common for your temperature to cycle up and down following surgery, especially at night when you are not up moving around and exerting yourself.  The breathing machine keeps your lungs expanded and your temperature down.   DIET:  As you were doing prior to hospitalization, we recommend a well-balanced diet.  DRESSING /  WOUND CARE / SHOWERING  Keep the surgical dressing until follow up.  The dressing is water proof, so you can shower without any extra covering.  IF THE DRESSING FALLS OFF or the wound gets wet inside, change the dressing with sterile gauze.  Please use good hand washing techniques before changing the dressing.  Do not use any lotions or creams on the incision until instructed by your surgeon.    ACTIVITY  Increase activity slowly as tolerated, but follow the weight bearing instructions below.   No driving for 6 weeks or until further direction given by your physician.  You cannot drive while taking narcotics.  No lifting or carrying greater than 10 lbs. until further directed by your surgeon. Avoid periods of inactivity such as sitting longer than an hour when not asleep. This helps prevent blood clots.  You may return to work once you are authorized by your doctor.     WEIGHT BEARING   Weight bearing as tolerated with assist device (walker, cane, etc) as directed, use it as long as suggested by your surgeon or therapist, typically at least 4-6 weeks.   EXERCISES  Results after joint replacement surgery are often greatly improved when you follow the exercise, range of motion and muscle strengthening exercises prescribed by your doctor. Safety measures are also important to protect the joint from  further injury. Any time any of these exercises cause you to have increased pain or swelling, decrease what you are doing until you are comfortable again and then slowly increase them. If you have problems or questions, call your caregiver or physical therapist for advice.   Rehabilitation is important following a joint replacement. After just a few days of immobilization, the muscles of the leg can become weakened and shrink (atrophy).  These exercises are designed to build up the tone and strength of the thigh and leg muscles and to improve motion. Often times heat used for twenty to thirty minutes before working out will loosen up your tissues and help with improving the range of motion but do not use heat for the first two weeks following surgery (sometimes heat can increase post-operative swelling).   These exercises can be done on a training (exercise) mat, on the floor, on a table or on a bed. Use whatever works the best and is most comfortable for you.    Use music or television while you are exercising so that the exercises are a pleasant break in your day. This will make your life better with the exercises acting as a break in your routine that you can look forward to.   Perform all exercises about fifteen times, three times per day or as directed.  You should exercise both the operative leg and the other leg as well.  Exercises include:   Quad Sets - Tighten up the muscle on the front of the thigh (Quad) and hold for 5-10 seconds.   Straight Leg Raises - With your knee straight (if you were given a brace, keep it on), lift the leg to 60 degrees, hold for 3 seconds, and slowly lower the leg.  Perform this exercise against resistance later as your leg gets stronger.  Leg Slides: Lying on your back, slowly slide your foot toward your buttocks, bending your knee up off the floor (only go as far as is comfortable). Then slowly slide your foot back down until your leg is flat on the floor again.    Angel Wings: Lying on your back spread  your legs to the side as far apart as you can without causing discomfort.  Hamstring Strength:  Lying on your back, push your heel against the floor with your leg straight by tightening up the muscles of your buttocks.  Repeat, but this time bend your knee to a comfortable angle, and push your heel against the floor.  You may put a pillow under the heel to make it more comfortable if necessary.   A rehabilitation program following joint replacement surgery can speed recovery and prevent re-injury in the future due to weakened muscles. Contact your doctor or a physical therapist for more information on knee rehabilitation.    CONSTIPATION  Constipation is defined medically as fewer than three stools per week and severe constipation as less than one stool per week.  Even if you have a regular bowel pattern at home, your normal regimen is likely to be disrupted due to multiple reasons following surgery.  Combination of anesthesia, postoperative narcotics, change in appetite and fluid intake all can affect your bowels.   YOU MUST use at least one of the following options; they are listed in order of increasing strength to get the job done.  They are all available over the counter, and you may need to use some, POSSIBLY even all of these options:    Drink plenty of fluids (prune juice may be helpful) and high fiber foods Colace 100 mg by mouth twice a day  Senokot for constipation as directed and as needed Dulcolax (bisacodyl), take with full glass of water  Miralax (polyethylene glycol) once or twice a day as needed.  If you have tried all these things and are unable to have a bowel movement in the first 3-4 days after surgery call either your surgeon or your primary doctor.    If you experience loose stools or diarrhea, hold the medications until you stool forms back up.  If your symptoms do not get better within 1 week or if they get worse, check with your  doctor.  If you experience "the worst abdominal pain ever" or develop nausea or vomiting, please contact the office immediately for further recommendations for treatment.   ITCHING:  If you experience itching with your medications, try taking only a single pain pill, or even half a pain pill at a time.  You can also use Benadryl over the counter for itching or also to help with sleep.   TED HOSE STOCKINGS:  Use stockings on both legs until for at least 2 weeks or as directed by physician office. They may be removed at night for sleeping.  MEDICATIONS:  See your medication summary on the "After Visit Summary" that nursing will review with you.  You may have some home medications which will be placed on hold until you complete the course of blood thinner medication.  It is important for you to complete the blood thinner medication as prescribed.  PRECAUTIONS:  If you experience chest pain or shortness of breath - call 911 immediately for transfer to the hospital emergency department.   If you develop a fever greater that 101 F, purulent drainage from wound, increased redness or drainage from wound, foul odor from the wound/dressing, or calf pain - CONTACT YOUR SURGEON.  FOLLOW-UP APPOINTMENTS:  If you do not already have a post-op appointment, please call the office for an appointment to be seen by your surgeon.  Guidelines for how soon to be seen are listed in your "After Visit Summary", but are typically between 1-4 weeks after surgery.   MAKE SURE YOU:  Understand these instructions.  Get help right away if you are not doing well or get worse.    Thank you for letting us be a part of your medical care team.  It is a privilege we respect greatly.  We hope these instructions will help you stay on track for a fast and full recovery!     Increase activity slowly as tolerated    Complete by:  As directed             Medication List    STOP  taking these medications        aspirin 325 MG tablet      TAKE these medications        ALPRAZolam 0.5 MG tablet  Commonly known as:  XANAX  Take 0.5 mg by mouth at bedtime as needed for anxiety.     atorvastatin 40 MG tablet  Commonly known as:  LIPITOR  Take 1 tablet (40 mg total) by mouth daily.     CRESTOR 20 MG tablet  Generic drug:  rosuvastatin  Take 20 mg by mouth every evening.     HYDROcodone-acetaminophen 5-325 MG per tablet  Commonly known as:  NORCO/VICODIN  Take 1 tablet by mouth every 6 (six) hours as needed for moderate pain.     HYDROmorphone 2 MG tablet  Commonly known as:  DILAUDID  Take 1 tablet (2 mg total) by mouth every 4 (four) hours as needed for severe pain.     losartan 100 MG tablet  Commonly known as:  COZAAR  Take 50 mg by mouth every evening.     nitroGLYCERIN 0.4 MG SL tablet  Commonly known as:  NITROSTAT  Place 0.4 mg under the tongue every 5 (five) minutes as needed for chest pain.     pantoprazole 40 MG tablet  Commonly known as:  PROTONIX  Take 40 mg by mouth daily as needed (for acid reflux).     rivaroxaban 10 MG Tabs tablet  Commonly known as:  XARELTO  Take 1 tablet (10 mg total) by mouth daily with breakfast.     tiZANidine 4 MG tablet  Commonly known as:  ZANAFLEX  Take 1 tablet (4 mg total) by mouth every 8 (eight) hours as needed.           Follow-up Information    Follow up with [facility].   Why:  home health physical therapy   Contact information:   [address] [place] [address] [phone]       Follow up with GIOFFRE,RONALD A, MD. Schedule an appointment as soon as possible for a visit in 2 weeks.   Specialty:  Orthopedic Surgery   Contact information:   [address] [place] [address] [phone]       Signed: Ardeen Jourdain, PA-C Orthopaedic Surgery 04/05/2015, 8:38 AM

## 2015-06-06 ENCOUNTER — Emergency Department (HOSPITAL_COMMUNITY)
Admission: EM | Admit: 2015-06-06 | Discharge: 2015-06-06 | Disposition: A | Discharge status: Home / Self Care | Payer: Medicare Other | Attending: Emergency Medicine | Admitting: Emergency Medicine

## 2015-06-06 ENCOUNTER — Encounter (HOSPITAL_COMMUNITY): Payer: Self-pay | Admitting: *Deleted

## 2015-06-06 ENCOUNTER — Emergency Department (HOSPITAL_COMMUNITY): Payer: Medicare Other

## 2015-06-06 DIAGNOSIS — R0789 Other chest pain: Secondary | ICD-10-CM | POA: Diagnosis not present

## 2015-06-06 DIAGNOSIS — F419 Anxiety disorder, unspecified: Secondary | ICD-10-CM | POA: Diagnosis not present

## 2015-06-06 DIAGNOSIS — K219 Gastro-esophageal reflux disease without esophagitis: Secondary | ICD-10-CM | POA: Insufficient documentation

## 2015-06-06 DIAGNOSIS — I10 Essential (primary) hypertension: Secondary | ICD-10-CM | POA: Insufficient documentation

## 2015-06-06 DIAGNOSIS — Z7901 Long term (current) use of anticoagulants: Secondary | ICD-10-CM | POA: Insufficient documentation

## 2015-06-06 DIAGNOSIS — Z87438 Personal history of other diseases of male genital organs: Secondary | ICD-10-CM | POA: Diagnosis not present

## 2015-06-06 DIAGNOSIS — I25119 Atherosclerotic heart disease of native coronary artery with unspecified angina pectoris: Secondary | ICD-10-CM | POA: Insufficient documentation

## 2015-06-06 DIAGNOSIS — Z9889 Other specified postprocedural states: Secondary | ICD-10-CM | POA: Insufficient documentation

## 2015-06-06 DIAGNOSIS — Z87891 Personal history of nicotine dependence: Secondary | ICD-10-CM | POA: Insufficient documentation

## 2015-06-06 DIAGNOSIS — E782 Mixed hyperlipidemia: Secondary | ICD-10-CM | POA: Diagnosis not present

## 2015-06-06 DIAGNOSIS — R079 Chest pain, unspecified: Secondary | ICD-10-CM | POA: Diagnosis present

## 2015-06-06 DIAGNOSIS — Z79899 Other long term (current) drug therapy: Secondary | ICD-10-CM | POA: Insufficient documentation

## 2015-06-06 DIAGNOSIS — Z85828 Personal history of other malignant neoplasm of skin: Secondary | ICD-10-CM | POA: Diagnosis not present

## 2015-06-06 DIAGNOSIS — M1990 Unspecified osteoarthritis, unspecified site: Secondary | ICD-10-CM | POA: Diagnosis not present

## 2015-06-06 DIAGNOSIS — Z86010 Personal history of colonic polyps: Secondary | ICD-10-CM | POA: Insufficient documentation

## 2015-06-06 LAB — I-STAT TROPONIN, ED: Troponin i, poc: 0 ng/mL (ref 0.00–0.08)

## 2015-06-06 LAB — BASIC METABOLIC PANEL
Anion gap: 7 (ref 5–15)
BUN: 9 mg/dL (ref 6–20)
CHLORIDE: 105 mmol/L (ref 101–111)
CO2: 26 mmol/L (ref 22–32)
CREATININE: 0.73 mg/dL (ref 0.61–1.24)
Calcium: 9.6 mg/dL (ref 8.9–10.3)
Glucose, Bld: 121 mg/dL — ABNORMAL HIGH (ref 65–99)
Potassium: 3.8 mmol/L (ref 3.5–5.1)
SODIUM: 138 mmol/L (ref 135–145)

## 2015-06-06 LAB — CBC
HEMATOCRIT: 40.3 % (ref 39.0–52.0)
Hemoglobin: 13.7 g/dL (ref 13.0–17.0)
MCH: 29.8 pg (ref 26.0–34.0)
MCHC: 34 g/dL (ref 30.0–36.0)
MCV: 87.8 fL (ref 78.0–100.0)
PLATELETS: 196 10*3/uL (ref 150–400)
RBC: 4.59 MIL/uL (ref 4.22–5.81)
RDW: 12.6 % (ref 11.5–15.5)
WBC: 4.9 10*3/uL (ref 4.0–10.5)

## 2015-06-06 MED ORDER — ROSUVASTATIN CALCIUM 20 MG PO TABS: 20.0000 mg | ORAL_TABLET | Freq: Every day | ORAL | Status: DC

## 2015-06-06 NOTE — Discharge Instructions (Signed)
As discussed, your evaluation today has been largely reassuring.  But, it is important that you monitor your condition carefully, and do not hesitate to return to the ED if you develop new, or concerning changes in your condition.  Please be sure to follow-up with your cardiologist within the next 2 weeks.

## 2015-06-06 NOTE — ED Provider Notes (Signed)
CSN: [id number]     Arrival date & time 06/06/15  1140 History   First MD Initiated Contact with Patient 06/06/15 1213     Chief Complaint  Patient presents with  . Chest Pain     (Consider location/radiation/quality/duration/timing/severity/associated sxs/prior Treatment) HPI Patient presents with concern of chest pain. Patient has had generalized discomfort, with sharp intermittent pain for several weeks, but over the past day has had 2 episodes of sudden onset brief severe chest pain. Each episode essentially resolved entirely with nitroglycerin.  Currently the patient has minimal complaints. There has been no dyspnea, no syncope, syncope, no nausea, vomiting, fever, chills, cough. Patient has a notable history of CAD, without intervention, due to location of the obstruction. She also has a recent medication change from Coreg to Lipitor, one month ago.  Past Medical History  Diagnosis Date  . Hypertension   . CAD (coronary artery disease)     with high grade obstruction second obtuse marginal and diffuse LAD and circumflex disease by cath 2010  . Erectile dysfunction   . Elevated cholesterol     LDL Goal < 70  . History of nuclear stress test 5/10    no ischemia  . Colon polyp   . Anxiety     claustrophobic  . Cancer ([place])     skin cancer, on R ear - basal cell   . Arthritis     throughout arms, knees & back & neck  . Anginal pain ([place])     last NTG 3-4 months ago  . History of nonmelanoma skin cancer   . GERD (gastroesophageal reflux disease)     has not had problem several months   Past Surgical History  Procedure Laterality Date  . Shoulder surgery      x3 left / x1 right  . Back surgery    . Cervical fusion    . Knee arthroscopy  06/13/2011    Procedure: ARTHROSCOPY KNEE;  Surgeon: Tobi Bastos;  Location: [facility];  Service: Orthopedics;  Laterality: Right;  Right Knee Arthroscopy with Medial Menisectomy  . Cardiac catheterization      2004 & 2/10, normal  LV function  . Finger fracture surgery      by Dr. Daylene Katayama  . Anterior cervical decomp/discectomy fusion N/A 05/29/2013    Procedure: Cervical five-six Anterior cervical decompression/diskectomy/fusion with exploration of Cervical six-seven, hardware removal ;  Surgeon: Erline Levine, MD;  Location: [facility];  Service: Neurosurgery;  Laterality: N/A;  Cervical five-six Anterior cervical decompression/diskectomy/fusion with exploration of Cervical six-seven, Hardware removal   . Neck surgery  05/29/2013  . Skin surgery      nose and right ear skin cancer surgery  . Skin cancer excision    . Colonoscopy with propofol N/A 05/14/2014    Procedure: COLONOSCOPY WITH PROPOFOL;  Surgeon: Garlan Fair, MD;  [facility];  Service: Endoscopy;  Laterality: N/A;  . Total knee arthroplasty Right 03/30/2015    Procedure: RIGHT TOTAL KNEE ARTHROPLASTY;  Surgeon: Latanya Maudlin, MD;  Location: [facility];  Service: Orthopedics;  Laterality: Right;   Family History  Problem Relation Age of Onset  . Heart attack Mother   . Heart attack Sister   . CAD Sister   . CAD Sister     stents  . CVA Sister    Social History  Substance Use Topics  . Smoking status: Former Smoker    Quit date: 07/10/1998  . Smokeless tobacco: Former Systems developer  Quit date: 05/27/1998  . Alcohol Use: No    Review of Systems  Constitutional:       Per HPI, otherwise negative  HENT:       Per HPI, otherwise negative  Respiratory:       Per HPI, otherwise negative  Cardiovascular:       Per HPI, otherwise negative  Gastrointestinal: Negative for vomiting.  Endocrine:       Negative aside from HPI  Genitourinary:       Neg aside from HPI   Musculoskeletal:       Per HPI, otherwise negative  Skin: Negative.   Neurological: Negative for syncope.      Allergies  Percocet  Home Medications   Prior to Admission medications   Medication Sig Start Date End Date Taking? Authorizing Provider  ALPRAZolam  Duanne Moron) 0.5 MG tablet Take 0.5 mg by mouth at bedtime as needed for anxiety.   Yes Historical Provider, MD  atorvastatin (LIPITOR) 40 MG tablet Take 40 mg by mouth daily.   Yes Historical Provider, MD  HYDROcodone-acetaminophen (NORCO/VICODIN) 5-325 MG per tablet Take 1 tablet by mouth every 6 (six) hours as needed for moderate pain. 03/31/15  Yes Amber Constable, PA-C  losartan (COZAAR) 100 MG tablet Take 50 mg by mouth every evening.    Yes Historical Provider, MD  nitroGLYCERIN (NITROSTAT) 0.4 MG SL tablet Place 0.4 mg under the tongue every 5 (five) minutes as needed for chest pain. 04/18/13  Yes Belva Crome, MD  pantoprazole (PROTONIX) 40 MG tablet Take 40 mg by mouth daily as needed (for acid reflux).    Yes Historical Provider, MD  rivaroxaban (XARELTO) 10 MG TABS tablet Take 1 tablet (10 mg total) by mouth daily with breakfast. 03/31/15   Amber Constable, PA-C   BP 118/76 mmHg  Pulse 76  Temp(Src) 98.1 F (36.7 C) (Oral)  Resp 16  Ht 5\' 9"  (1.753 m)  Wt 190 lb (86.183 kg)  BMI 28.05 kg/m2  SpO2 96% Physical Exam  Constitutional: He is oriented to person, place, and time. He appears well-developed. No distress.  HENT:  Head: Normocephalic and atraumatic.  Eyes: Conjunctivae and EOM are normal.  Cardiovascular: Normal rate and regular rhythm.   Pulmonary/Chest: Effort normal. No stridor. No respiratory distress.  Abdominal: He exhibits no distension.  Musculoskeletal: He exhibits no edema.  No distension, no "new" swelling or discoloration.  Neurological: He is alert and oriented to person, place, and time.  Skin: Skin is warm and dry.  Psychiatric: He has a normal mood and affect.  Nursing note and vitals reviewed.   ED Course  Procedures (including critical care time) Labs Review Labs Reviewed  BASIC METABOLIC PANEL - Abnormal; Notable for the following:    Glucose, Bld 121 (*)    All other components within normal limits  CBC  I-STAT TROPOININ, ED    Imaging  Review Dg Chest 2 View  06/06/2015  CLINICAL DATA:  Chest pain and arm tingling EXAM: CHEST  2 VIEW COMPARISON:  03/23/2015 FINDINGS: Normal mediastinum and cardiac silhouette. Normal pulmonary vasculature. No evidence of effusion, infiltrate, or pneumothorax. No acute bony abnormality. Anterior cervical fusion noted IMPRESSION: No acute cardiopulmonary process. Electronically Signed   By: Suzy Bouchard M.D.   On: 06/06/2015 13:40   I have personally reviewed and evaluated these images and lab results as part of my medical decision-making.   EKG Interpretation   Date/Time:  Sunday June 06 2015 11:44:16 EST Ventricular Rate:  93 PR Interval:  116 QRS Duration: 76 QT Interval:  326 QTC Calculation: 405 R Axis:   23 Text Interpretation:  Sinus rhythm with occasional Premature ventricular  complexes Otherwise normal ECG Sinus rhythm Premature ventricular  complexes T wave abnormality Abnormal ekg Confirmed by Carmin Muskrat   MD [phone]) on 06/06/2015 12:11:27 PM     3:02 PM On repeat exam the patient is in no distress, no new complaints but We discussed all findings, ED of switching his statin back to Coreg, following up with cardiology, the patient will do this.  MDM  Patient presents after an episode of chest pain, both no ongoing complaints. Notably, the patient has had episodes over the past few weeks, with no sustained symptoms, and was largely reassuring findings here. No evidence for ongoing pulmonary embolism, ACS, other acute new pathology. Patient may have been experiencing changes secondary to medication changes, given the temporal relationship of change, onset of symptoms.  Carmin Muskrat, MD 06/06/15 [phone]

## 2015-06-06 NOTE — ED Notes (Signed)
Pt c/o CP onset x 1 wk with worsening last night, pt reports some relief with x 2 SL nitro, pt reports CP mid non radiating CP today @ 9am, pt c/o nausea, denies v/d, pt A&Ox4, pt denies SOB, pt c/o fatigue, A&O x4

## 2015-06-22 ENCOUNTER — Telehealth: Payer: Self-pay | Admitting: Interventional Cardiology

## 2015-06-22 ENCOUNTER — Telehealth: Payer: Self-pay | Admitting: *Deleted

## 2015-06-22 MED ORDER — ROSUVASTATIN CALCIUM 20 MG PO TABS: 20.0000 mg | ORAL_TABLET | Freq: Every day | ORAL | Status: DC

## 2015-06-22 NOTE — Telephone Encounter (Signed)
Rx for Crestor 20mg  qd sent to pt mail order pharmacy.  FYI fwd to Dr.Smith

## 2015-06-22 NOTE — Telephone Encounter (Signed)
New Message  Pt c/o medication issue:  1. Name of Medication: Crestor   2. How are you currently taking this medication (dosage and times per day)? 20 mg once daily   3. Are you having a reaction (difficulty breathing--STAT)? Pt was having a reaction to Atorvastatin 40 mg. Pt states that wasn't working well. He was having chest pain. And this happened after taking the Atorvastatin. He has to take the nitros. Pt states that while he was in the hospital he was placed back on Crestor and would like to have the mail order switched back to Crestor and refills. Per pt he has 12 pills left

## 2015-06-22 NOTE — Telephone Encounter (Signed)
Patient left a vm on the refill line requesting an rx for crest[facility] as he cannot take the atorvastatin that Dr Tamala Julian prescribed for him recently. Please advise. Thanks, MI

## 2015-07-09 ENCOUNTER — Other Ambulatory Visit (INDEPENDENT_AMBULATORY_CARE_PROVIDER_SITE_OTHER): Payer: Medicare Other | Admitting: *Deleted

## 2015-07-09 DIAGNOSIS — I2510 Atherosclerotic heart disease of native coronary artery without angina pectoris: Secondary | ICD-10-CM

## 2015-07-09 DIAGNOSIS — I10 Essential (primary) hypertension: Secondary | ICD-10-CM | POA: Diagnosis not present

## 2015-07-09 DIAGNOSIS — E785 Hyperlipidemia, unspecified: Secondary | ICD-10-CM

## 2015-07-09 LAB — LIPID PANEL
CHOL/HDL RATIO: 2.9 ratio (ref <=5.0)
CHOLESTEROL: 137 mg/dL (ref 125–200)
HDL: 47 mg/dL (ref >=40)
LDL Cholesterol: 71 mg/dL (ref <130)
TRIGLYCERIDES: 95 mg/dL (ref <150)
VLDL: 19 mg/dL (ref <30)

## 2015-07-09 LAB — ALT: ALT: 13 U/L (ref 9–46)

## 2015-07-09 NOTE — Addendum Note (Signed)
Addended by: Eulis Foster on: 07/09/2015 08:56 AM   Modules accepted: Orders

## 2015-07-13 ENCOUNTER — Other Ambulatory Visit: Payer: Self-pay

## 2015-07-13 DIAGNOSIS — E785 Hyperlipidemia, unspecified: Secondary | ICD-10-CM

## 2015-07-16 ENCOUNTER — Other Ambulatory Visit: Payer: Self-pay | Admitting: Interventional Cardiology

## 2015-07-16 MED ORDER — NITROGLYCERIN 0.4 MG SL SUBL: 0.4000 mg | SUBLINGUAL_TABLET | SUBLINGUAL | Status: DC | PRN

## 2015-10-29 ENCOUNTER — Other Ambulatory Visit: Payer: Self-pay | Admitting: Interventional Cardiology

## 2015-12-20 ENCOUNTER — Telehealth: Payer: Self-pay | Admitting: Interventional Cardiology

## 2015-12-20 NOTE — Telephone Encounter (Signed)
Follow up      Calling to see if he can take iodonethacine prescribed by his orthopedic doctor.

## 2015-12-20 NOTE — Telephone Encounter (Signed)
Pt states ortho MD prescribed indomethacin for joint inflammation.  Pt advised that cardiology advises avoid NSAIDS in patients with CAD. Pt states he had diarrhea and GI upset after a couple of doses of indomethacin.  Pt advised to call ortho and ask for alternative to indomethacin/NSAIDS and let them know about GI side effects after taking indomethacin. Pt verbalized understanding

## 2015-12-20 NOTE — Telephone Encounter (Signed)
New message      Pt c/o medication issue:  1. Name of Medication: Indonethacine 2. How are you currently taking this medication (dosage and times per day)? 2 mg po daily  3. Are you having a reaction (difficulty breathing--STAT)? No  4. What is your medication issue? The orthopedic doctor put the pt on this medication, the pt concerned about the side effects cause of increase risk of heart attacks and stroke..the pt did state over the weekend he took the medication only 2 tablets about 3 hours after he had diarrhea, and the bottle states if experiencing diarrhea stop taking the medication.

## 2015-12-20 NOTE — Telephone Encounter (Signed)
Sent to device in error. Will route to triage.

## 2016-02-01 ENCOUNTER — Other Ambulatory Visit: Payer: Self-pay | Admitting: Interventional Cardiology

## 2016-02-01 NOTE — Telephone Encounter (Signed)
AVS Reports   Date/Time Report Action User  02/08/2015 8:52 AM After Visit Summary Printed Lamar Laundry, CMA  Patient Instructions   Medication Instructions:  Your physician has recommended you make the following change in your medication:  1)STOP Crestor 2)START Atorvastatin (Lipitor) 40 mg daily. An Rx has been sent to your pharmacy   Telephone   06/22/2015 [place], [place]  ([place])  Juventino Slovak, [place]      06/22/15 9:55 AM  Note    Patient left a vm on the refill line requesting an rx for crest[facility] as he cannot take the atorvastatin that Dr Tamala Julian prescribed for him recently. Please advise. Thanks, MI          06/22/15 9:56 AM  Juventino Slovak, CMA routed this conversation to Lamar Laundry, [place]  Telephone Open    06/22/2015 [facility]  Belva Crome, MD  Cardiology   Conversation  [facility])  June 22, 2015        9:51 AM    [place] contacted [place]      9:51 AM  Note    New Message  Pt c/o medication issue:  1. Name of Medication: Crestor   2. How are you currently taking this medication (dosage and times per day)? 20 mg once daily   3. Are you having a reaction (difficulty breathing--STAT)? Pt was having a reaction to Atorvastatin 40 mg. Pt states that wasn't working well. He was having chest pain. And this happened after taking the Atorvastatin. He has to take the nitros. Pt states that while he was in the hospital he was placed back on Crestor and would like to have the mail order switched back to Crestor and refills. Per pt he has 12 pills left              9:55 AM  Santo Held routed this conversation to [facility] Refill        10:12 AM  Juventino Slovak, CMA routed this conversation to Lamar Laundry, CMA  Lamar Laundry, CMA       10:20 AM  Note    Rx for Crestor 20mg  qd sent to pt mail order pharmacy.  FYI fwd to Dr.Smith    Lamar Laundry, CMA  to Belva Crome, MD       10:22 AM  FYI  July 16, 2015  Santo Held  to Lamar Laundry, CMA       4:22 PM  Please close encounter thanks  This encounter is not signed. The conversation may still be ongoing

## 2016-02-03 ENCOUNTER — Telehealth: Payer: Self-pay | Admitting: Interventional Cardiology

## 2016-02-03 NOTE — Telephone Encounter (Signed)
°  New Prob   Pt has a question regarding topical Pennsaid medication that was prescribed by his orthopaedist. Please call.

## 2016-02-03 NOTE — Telephone Encounter (Signed)
Pt states he had a knee replacement a year ago pt states that his orthopedic surgeon had prescribed before an NSAID medication which he stop taken because her Cardiologist recommended for him to avoid taken NSAID medication.   Today his surgeon has prescribed Pennsaid topical cream. It is an NSAID medication. Pt would like to know because it is topical would it make a difference on the side effects. Is it safe for him to use.

## 2016-02-03 NOTE — Telephone Encounter (Signed)
Okay to use Topical NSAID.

## 2016-02-03 NOTE — Telephone Encounter (Signed)
Left pt a detail message and to call back.

## 2016-02-03 NOTE — Telephone Encounter (Signed)
Pt is aware that is fine for pt to used the Pennsaid topical cream. Pt verbalized understanding.

## 2016-03-20 ENCOUNTER — Other Ambulatory Visit: Payer: Self-pay | Admitting: Interventional Cardiology

## 2016-03-27 ENCOUNTER — Telehealth: Payer: Self-pay | Admitting: Interventional Cardiology

## 2016-03-27 NOTE — Telephone Encounter (Signed)
Walk in pt Form-[facility]-Clearance dropped off Dr.Smith back in office Thursday 03/30/16 will give to him then.

## 2016-04-04 ENCOUNTER — Encounter: Payer: Self-pay | Admitting: Cardiology

## 2016-04-04 ENCOUNTER — Telehealth: Payer: Self-pay

## 2016-04-04 NOTE — Telephone Encounter (Signed)
Edward [name],  This patient dropped a a cardiac clearance form to be signed by Dr.Smith. The patient also has an appt to see on 9/28. Per Dr.Smith the patient can be cleared but should have a stress test in the near future, Dr.Smith would likr you to order and document since you will be seeing him soon. I will leave the clearance form I your mailbox in the triage room.

## 2016-04-05 NOTE — Progress Notes (Signed)
Cardiology Office Note   Date:  04/06/2016   ID:  Edward [name], DOB [birth year], MRN [id number]  PCP:  Edward Argyle, MD  Cardiologist:  Dr. Tamala [name]    Chief Complaint  Patient presents with  . Pre-op Exam    for knee surgery  . Coronary Artery Disease      History of Present Illness: Edward [name] is a 64 y.o. male who presents for cardiac clearance. Needs re-do of last years Rt total knee. Has continued pain.  Surgery with Dr. Alvan [name] scheduled for 05/08/16.   He has a history of coronary artery disease with last cath 2010 with nonobstructive disease., gastroesophageal reflux, hyperlipidemia, and hypertension.  Today no chest pain, he does use NTG prn but has not used for 3 months.  No SOB.  + rt knee pain. Tries to eat healthy, lives alone so sometimes difficult.    Recent spider bite? To rt lat lower leg.  Treated wit ABX.    Past Medical History:  Diagnosis Date  . Anginal pain ([place])    last NTG 3-4 months ago  . Anxiety    claustrophobic  . Arthritis    throughout arms, knees & back & neck  . CAD (coronary artery disease)    with high grade obstruction second obtuse marginal and diffuse LAD and circumflex disease by cath 2010  . Cancer ([place])    skin cancer, on R ear - basal cell   . Colon polyp   . Elevated cholesterol    LDL Goal < 70  . Erectile dysfunction   . GERD (gastroesophageal reflux disease)    has not had problem several months  . History of nonmelanoma skin cancer   . History of nuclear stress test 5/10   no ischemia  . Hypertension     Past Surgical History:  Procedure Laterality Date  . ANTERIOR CERVICAL DECOMP/DISCECTOMY FUSION N/A 05/29/2013   Procedure: Cervical five-six Anterior cervical decompression/diskectomy/fusion with exploration of Cervical six-seven, hardware removal ;  Surgeon: Edward Levine, MD;  Location: [facility];  Service: Neurosurgery;  Laterality: N/A;  Cervical five-six Anterior cervical decompression/diskectomy/fusion  with exploration of Cervical six-seven, Hardware removal   . BACK SURGERY    . CARDIAC CATHETERIZATION     2004 & 2/10, normal LV function  . CERVICAL FUSION    . COLONOSCOPY WITH PROPOFOL N/A 05/14/2014   Procedure: COLONOSCOPY WITH PROPOFOL;  Surgeon: Edward Fair, MD;  [facility];  Service: Endoscopy;  Laterality: N/A;  . FINGER FRACTURE SURGERY     by Dr. Daylene [name]  . KNEE ARTHROSCOPY  06/13/2011   Procedure: ARTHROSCOPY KNEE;  Surgeon: Edward [name];  [facility]: [facility];  Service: Orthopedics;  Laterality: Right;  Right Knee Arthroscopy with Medial Menisectomy  . NECK SURGERY  05/29/2013  . SHOULDER SURGERY     x3 left / x1 right  . SKIN CANCER EXCISION    . SKIN SURGERY     nose and right ear skin cancer surgery  . TOTAL KNEE ARTHROPLASTY Right 03/30/2015   Procedure: RIGHT TOTAL KNEE ARTHROPLASTY;  Surgeon: Edward Maudlin, MD;  [facility]: [facility];  Service: Orthopedics;  Laterality: Right;     Current Outpatient Prescriptions  Medication Sig Dispense Refill  . ALPRAZolam (XANAX) 0.5 MG tablet Take 0.5 mg by mouth at bedtime as needed for anxiety.    Marland Kitchen doxycycline (VIBRAMYCIN) 100 MG capsule Take 100 mg by mouth 2 (two) times daily.    Marland Kitchen HYDROcodone-acetaminophen (NORCO/VICODIN) 5-325  MG per tablet Take 1 tablet by mouth every 6 (six) hours as needed for moderate pain. 60 tablet 0  . losartan (COZAAR) 100 MG tablet Take 50 mg by mouth every evening.     . nitroGLYCERIN (NITROSTAT) 0.4 MG SL tablet Place 1 tablet (0.4 mg total) under the tongue every 5 (five) minutes as needed for chest pain. 25 tablet 4  . pantoprazole (PROTONIX) 40 MG tablet Take 40 mg by mouth daily as needed (for acid reflux).     . rivaroxaban (XARELTO) 10 MG TABS tablet Take 1 tablet (10 mg total) by mouth daily with breakfast. 19 tablet 0  . rosuvastatin (CRESTOR) 20 MG tablet Take 1 tablet (20 mg total) by mouth daily. Patient is overdue for an appointment. Please call and schedule for further  refills 15 tablet 0  . tamsulosin (FLOMAX) 0.4 MG CAPS capsule Take 1 capsule by mouth daily.     No current facility-administered medications for this visit.     Allergies:   Percocet [oxycodone-acetaminophen]    Social History:  The patient  reports that he quit smoking about 17 years ago. He quit smokeless tobacco use about 17 years ago. He reports that he does not drink alcohol or use drugs.   Family History:  The patient's family history includes CAD in his sister and sister; CVA in his sister; Heart attack in his mother and sister.    ROS:  General:no colds or fevers, no weight changes Skin:no rashes or ulcers HEENT:no blurred vision, no congestion CV:see HPI PUL:see HPI GI:no diarrhea constipation or melena, no indigestion GU:no hematuria, no dysuria MS:no joint pain, no claudication Neuro:no syncope, no lightheadedness Endo:no diabetes, no thyroid disease  Wt Readings from Last 3 Encounters:  04/06/16 192 lb 12.8 oz (87.5 kg)  06/06/15 190 lb (86.2 kg)  03/30/15 193 lb 6 oz (87.7 kg)     PHYSICAL EXAM: VS:  BP 120/70   Pulse (!) 52   Ht 5\' 10"  (1.778 m)   Wt 192 lb 12.8 oz (87.5 kg)   SpO2 97%   BMI 27.66 kg/m  , BMI Body mass index is 27.66 kg/m. General:Pleasant affect, NAD Skin:Warm and dry, brisk capillary refill HEENT:normocephalic, sclera clear, mucus membranes moist Neck:supple, no JVD, no bruits  Heart:S1S2 RRR without murmur, gallup, rub or click Lungs:clear without rales, rhonchi, or wheezes [id number], non tender, + BS, do not palpate liver spleen or masses Ext:no lower ext edema, 2+ pedal pulses, 2+ radial pulses, + varicosities  Neuro:alert and oriented X 3, MAE, follows commands, + facial symmetry    EKG:  EKG is NOT  ordered today. Will check with Nuc study.   Recent Labs: 06/06/2015: BUN 9; Creatinine, Ser 0.73; Hemoglobin 13.7; Platelets 196; Potassium 3.8; Sodium 138 07/09/2015: ALT 13    Lipid Panel    Component Value Date/Time    CHOL 137 07/09/2015 0856   TRIG 95 07/09/2015 0856   HDL 47 07/09/2015 0856   CHOLHDL 2.9 07/09/2015 0856   VLDL 19 07/09/2015 0856   LDLCALC 71 07/09/2015 0856       Other studies Reviewed: Additional studies/ records that were reviewed today include: .  Nuc study 2010.  Ejection fraction:  The calculated ejection fraction is 62% with an end diastolic volume of 0000000 ml and an end-systolic volume of 41 ml.   IMPRESSION: No stress induced ischemia.   Extending at the inferior apex is suspected.  GI activity does limit evaluation of this area.  ASSESSMENT AND  PLAN:   1. Atherosclerosis of native coronary artery of native heart with angina pectoris  The patient is having minimal angina. He has not used nitroglycerin in over 3 months. With plan for knee surgery Dr. Tamala [name] would like lexiscan myoview, pt cannot walk at this time on treadmill.  Dr. Tamala [name] has cleared unless issue occurs.   2. Essential hypertension, benign  Well controlled.  3. Hyperlipidemia- Dr. Felipa Eth follows - we did refill crestor 90 days at a time.   4. Spider bite?-followed by PCP.   Follow up in 1 year with Dr. Tamala [name] unless problems in the meantime.   Current medicines are reviewed with the patient today.  The patient Has no concerns regarding medicines.  The following changes have been made:  See above Labs/ tests ordered today include:see above  Disposition:   FU:  see above  Signed, Cecilie Kicks, NP  04/06/2016 8:18 AM    [address], [place], [name], [place] Phone: [phone]; Fax: [phone]

## 2016-04-06 ENCOUNTER — Ambulatory Visit (INDEPENDENT_AMBULATORY_CARE_PROVIDER_SITE_OTHER): Payer: Medicare Other | Admitting: Cardiology

## 2016-04-06 ENCOUNTER — Encounter (INDEPENDENT_AMBULATORY_CARE_PROVIDER_SITE_OTHER): Payer: Self-pay

## 2016-04-06 ENCOUNTER — Encounter: Payer: Self-pay | Admitting: Cardiology

## 2016-04-06 VITALS — BP 120/70 | HR 52 | Ht 70.0 in | Wt 192.8 lb

## 2016-04-06 DIAGNOSIS — Z01818 Encounter for other preprocedural examination: Secondary | ICD-10-CM

## 2016-04-06 DIAGNOSIS — Z79899 Other long term (current) drug therapy: Secondary | ICD-10-CM | POA: Diagnosis not present

## 2016-04-06 DIAGNOSIS — I2510 Atherosclerotic heart disease of native coronary artery without angina pectoris: Secondary | ICD-10-CM | POA: Diagnosis not present

## 2016-04-06 DIAGNOSIS — E785 Hyperlipidemia, unspecified: Secondary | ICD-10-CM

## 2016-04-06 MED ORDER — ROSUVASTATIN CALCIUM 20 MG PO TABS: 20.0000 mg | ORAL_TABLET | Freq: Every day | ORAL | 36 refills | Status: DC

## 2016-04-06 NOTE — Patient Instructions (Addendum)
Your physician recommends that you continue on your current medications as directed. Please refer to the Current Medication list given to you today.   Your physician has requested that you have a lexiscan myoview. For further information please visit [email or web address]. Please follow instruction sheet, as given. Your physician recommends that you return for lab work in: [place]  ALT  IN  December   Your physician wants you to follow-up in: [place] will receive a reminder letter in the mail two months in advance. If you don't receive a letter, please call our office to schedule the follow-up appointment.

## 2016-04-10 ENCOUNTER — Telehealth (HOSPITAL_COMMUNITY): Payer: Self-pay | Admitting: *Deleted

## 2016-04-10 NOTE — Telephone Encounter (Signed)
Patient given detailed instructions per Myocardial Perfusion Study Information Sheet for the test on  04/12/16 . Patient notified to arrive 15 minutes early and that it is imperative to arrive on time for appointment to keep from having the test rescheduled.  If you need to cancel or reschedule your appointment, please call the office within 24 hours of your appointment. Failure to do so may result in a cancellation of your appointment, and a $50 no show fee. Patient verbalized understanding.  Edward [name] Jacqueline    

## 2016-04-12 ENCOUNTER — Ambulatory Visit (HOSPITAL_COMMUNITY): Payer: Medicare Other | Attending: Cardiovascular Disease

## 2016-04-12 DIAGNOSIS — Z01818 Encounter for other preprocedural examination: Secondary | ICD-10-CM | POA: Diagnosis not present

## 2016-04-12 DIAGNOSIS — Z01810 Encounter for preprocedural cardiovascular examination: Secondary | ICD-10-CM | POA: Diagnosis present

## 2016-04-12 LAB — MYOCARDIAL PERFUSION IMAGING
CHL CUP NUCLEAR SDS: 2
CHL CUP NUCLEAR SRS: 2
CHL CUP NUCLEAR SSS: 4
CHL CUP RESTING HR STRESS: 56 {beats}/min
LHR: 0.34
LV dias vol: 115 mL (ref 62–150)
LV sys vol: 51 mL
NUC STRESS TID: 0.88
Peak HR: 84 {beats}/min

## 2016-04-12 MED ORDER — REGADENOSON 0.4 MG/5ML IV SOLN
0.4000 mg | Freq: Once | INTRAVENOUS | Status: AC
  Administered 2016-04-12: 0.4 mg via INTRAVENOUS

## 2016-04-12 MED ORDER — TECHNETIUM TC 99M TETROFOSMIN IV KIT
10.5000 | PACK | Freq: Once | INTRAVENOUS | Status: AC | PRN
  Administered 2016-04-12: 11 via INTRAVENOUS
  Filled 2016-04-12: qty 11

## 2016-04-12 MED ORDER — TECHNETIUM TC 99M TETROFOSMIN IV KIT
32.8000 | PACK | Freq: Once | INTRAVENOUS | Status: AC | PRN
  Administered 2016-04-12: 32.8 via INTRAVENOUS
  Filled 2016-04-12: qty 33

## 2016-04-27 ENCOUNTER — Other Ambulatory Visit (HOSPITAL_COMMUNITY): Payer: Self-pay | Admitting: *Deleted

## 2016-04-27 NOTE — Patient Instructions (Addendum)
Edward [name]  04/27/2016   Your procedure is scheduled on: 05-08-16  Report to [facility] Main  Entrance take [facility]  elevators to 3rd floor to  [place] at 1040 AM.  Call this number if you have problems the morning of surgery [phone]   Remember: ONLY 1 PERSON MAY GO WITH YOU TO SHORT STAY TO GET  READY MORNING OF [place].  Do not eat food or drink liquids :After Midnight.     Take these medicines the morning of surgery with A SIP OF WATER: hydrocodone if needed, tamsulosin                               You may not have any metal on your body including hair pins and              piercings  Do not wear jewelry, make-up, lotions, powders or perfumes, deodorant             Do not wear nail polish.  Do not shave  48 hours prior to surgery.              Men may shave face and neck.   Do not bring valuables to the hospital. [place].  Contacts, dentures or bridgework may not be worn into surgery.  Leave suitcase in the car. After surgery it may be brought to your room.                  Please read over the following fact sheets you were given: _____________________________________________________________________             [facility] - Preparing for Surgery Before surgery, you can play an important role.  Because skin is not sterile, your skin needs to be as free of germs as possible.  You can reduce the number of germs on your skin by washing with CHG (chlorahexidine gluconate) soap before surgery.  CHG is an antiseptic cleaner which kills germs and bonds with the skin to continue killing germs even after washing. Please DO NOT use if you have an allergy to CHG or antibacterial soaps.  If your skin becomes reddened/irritated stop using the CHG and inform your nurse when you arrive at Short Stay. Do not shave (including legs and underarms) for at least 48 hours prior to the first CHG shower.   You may shave your face/neck. Please follow these instructions carefully:  1.  Shower with CHG Soap the night before surgery and the  morning of Surgery.  2.  If you choose to wash your hair, wash your hair first as usual with your  normal  shampoo.  3.  After you shampoo, rinse your hair and body thoroughly to remove the  shampoo.                           4.  Use CHG as you would any other liquid soap.  You can apply chg directly  to the skin and wash                       Gently with a scrungie or clean washcloth.  5.  Apply the  CHG Soap to your body ONLY FROM THE NECK DOWN.   Do not use on face/ open                           Wound or open sores. Avoid contact with eyes, ears mouth and genitals (private parts).                       Wash face,  Genitals (private parts) with your normal soap.             6.  Wash thoroughly, paying special attention to the area where your surgery  will be performed.  7.  Thoroughly rinse your body with warm water from the neck down.  8.  DO NOT shower/wash with your normal soap after using and rinsing off  the CHG Soap.                9.  Pat yourself dry with a clean towel.            10.  Wear clean pajamas.            11.  Place clean sheets on your bed the night of your first shower and do not  sleep with pets. Day of Surgery : Do not apply any lotions/deodorants the morning of surgery.  Please wear clean clothes to the hospital/surgery center.  FAILURE TO FOLLOW THESE INSTRUCTIONS MAY RESULT IN THE CANCELLATION OF YOUR SURGERY PATIENT SIGNATURE_________________________________  NURSE SIGNATURE__________________________________  ________________________________________________________________________   Edward [name]  An incentive spirometer is a tool that can help keep your lungs clear and active. This tool measures how well you are filling your lungs with each breath. Taking long deep breaths may help reverse or decrease the chance of  developing breathing (pulmonary) problems (especially infection) following:  A long period of time when you are unable to move or be active. BEFORE THE PROCEDURE   If the spirometer includes an indicator to show your best effort, your nurse or respiratory therapist will set it to a desired goal.  If possible, sit up straight or lean slightly forward. Try not to slouch.  Hold the incentive spirometer in an upright position. INSTRUCTIONS FOR USE  1. Sit on the edge of your bed if possible, or sit up as far as you can in bed or on a chair. 2. Hold the incentive spirometer in an upright position. 3. Breathe out normally. 4. Place the mouthpiece in your mouth and seal your lips tightly around it. 5. Breathe in slowly and as deeply as possible, raising the piston or the ball toward the top of the column. 6. Hold your breath for 3-5 seconds or for as long as possible. Allow the piston or ball to fall to the bottom of the column. 7. Remove the mouthpiece from your mouth and breathe out normally. 8. Rest for a few seconds and repeat Steps 1 through 7 at least 10 times every 1-2 hours when you are awake. Take your time and take a few normal breaths between deep breaths. 9. The spirometer may include an indicator to show your best effort. Use the indicator as a goal to work toward during each repetition. 10. After each set of 10 deep breaths, practice coughing to be sure your lungs are clear. If you have an incision (the cut made at the time of surgery), support your incision when coughing by placing a pillow or rolled up  towels firmly against it. Once you are able to get out of bed, walk around indoors and cough well. You may stop using the incentive spirometer when instructed by your caregiver.  RISKS AND COMPLICATIONS  Take your time so you do not get dizzy or light-headed.  If you are in pain, you may need to take or ask for pain medication before doing incentive spirometry. It is harder to take a  deep breath if you are having pain. AFTER USE  Rest and breathe slowly and easily.  It can be helpful to keep track of a log of your progress. Your caregiver can provide you with a simple table to help with this. If you are using the spirometer at home, follow these instructions: [place] IF:   You are having difficultly using the spirometer.  You have trouble using the spirometer as often as instructed.  Your pain medication is not giving enough relief while using the spirometer.  You develop fever of 100.5 F (38.1 C) or higher. SEEK IMMEDIATE MEDICAL CARE IF:   You cough up bloody sputum that had not been present before.  You develop fever of 102 F (38.9 C) or greater.  You develop worsening pain at or near the incision site. MAKE SURE YOU:   Understand these instructions.  Will watch your condition.  Will get help right away if you are not doing well or get worse. Document Released: 11/06/2006 Document Revised: 09/18/2011 Document Reviewed: 01/07/2007 ExitCare Patient Information 2014 Exit[employer], [place].   ________________________________________________________________________  WHAT IS A BLOOD TRANSFUSION? Blood Transfusion Information  A transfusion is the replacement of blood or some of its parts. Blood is made up of multiple cells which provide different functions.  Red blood cells carry oxygen and are used for blood loss replacement.  White blood cells fight against infection.  Platelets control bleeding.  Plasma helps clot blood.  Other blood products are available for specialized needs, such as hemophilia or other clotting disorders. BEFORE THE TRANSFUSION  Who gives blood for transfusions?   Healthy volunteers who are fully evaluated to make sure their blood is safe. This is blood bank blood. Transfusion therapy is the safest it has ever been in the practice of medicine. Before blood is taken from a donor, a complete history is taken to make  sure that person has no history of diseases nor engages in risky social behavior (examples are intravenous drug use or sexual activity with multiple partners). The donor's travel history is screened to minimize risk of transmitting infections, such as malaria. The donated blood is tested for signs of infectious diseases, such as HIV and hepatitis. The blood is then tested to be sure it is compatible with you in order to minimize the chance of a transfusion reaction. If you or a relative donates blood, this is often done in anticipation of surgery and is not appropriate for emergency situations. It takes many days to process the donated blood. RISKS AND COMPLICATIONS Although transfusion therapy is very safe and saves many lives, the main dangers of transfusion include:   Getting an infectious disease.  Developing a transfusion reaction. This is an allergic reaction to something in the blood you were given. Every precaution is taken to prevent this. The decision to have a blood transfusion has been considered carefully by your caregiver before blood is given. Blood is not given unless the benefits outweigh the risks. AFTER THE TRANSFUSION  Right after receiving a blood transfusion, you will usually feel much  better and more energetic. This is especially true if your red blood cells have gotten low (anemic). The transfusion raises the level of the red blood cells which carry oxygen, and this usually causes an energy increase.  The nurse administering the transfusion will monitor you carefully for complications. HOME CARE INSTRUCTIONS  No special instructions are needed after a transfusion. You may find your energy is better. Speak with your caregiver about any limitations on activity for underlying diseases you may have. SEEK MEDICAL CARE IF:   Your condition is not improving after your transfusion.  You develop redness or irritation at the intravenous (IV) site. SEEK IMMEDIATE MEDICAL CARE IF:   Any of the following symptoms occur over the next 12 hours:  Shaking chills.  You have a temperature by mouth above 102 F (38.9 C), not controlled by medicine.  Chest, back, or muscle pain.  People around you feel you are not acting correctly or are confused.  Shortness of breath or difficulty breathing.  Dizziness and fainting.  You get a rash or develop hives.  You have a decrease in urine output.  Your urine turns a dark color or changes to pink, red, or brown. Any of the following symptoms occur over the next 10 days:  You have a temperature by mouth above 102 F (38.9 C), not controlled by medicine.  Shortness of breath.  Weakness after normal activity.  The white part of the eye turns yellow (jaundice).  You have a decrease in the amount of urine or are urinating less often.  Your urine turns a dark color or changes to pink, red, or brown. Document Released: 06/23/2000 Document Revised: 09/18/2011 Document Reviewed: 02/10/2008 [facility] Patient Information 2014 [place], [place].  _______________________________________________________________________

## 2016-04-27 NOTE — Progress Notes (Signed)
Cardiac clearance note dr Tamala Julian on chart and in epic telephone note 04-06-16 Medical clearance note 03-17-16 dr Felipa Eth on chart ekg and chest xray 06-06-15 epic Stress test 04-12-16 epic

## 2016-04-27 NOTE — H&P (Signed)
Edward [name] is an 65 y.o. male.    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 male complaining of right knee pain for 1+ years. Pain had continually increased since the beginning. X-rays in the clinic show previous right TKA. Pt has tried various conservative treatments which have failed to alleviate their symptoms, including analgesic medications, PT, assistance device and activity modification. Various options are discussed with the patient. Risks, benefits and expectations were discussed with the patient. Patient understand the risks, benefits and expectations and wishes to proceed with surgery.    PCP: Mathews Argyle, MD  D/C Plans:      Home  Post-op Meds:       No Rx given  Tranexamic Acid:      To be given - IV   Decadron:      Is to be given  FYI:     ASA  Norco   PMH: Past Medical History:  Diagnosis Date  . Anginal pain ([place])    last NTG 3-4 months ago  . Anxiety    claustrophobic  . Arthritis    throughout arms, knees & back & neck  . CAD (coronary artery disease)    with high grade obstruction second obtuse marginal and diffuse LAD and circumflex disease by cath 2010  . Cancer ([place])    skin cancer, on R ear - basal cell   . Colon polyp   . Elevated cholesterol    LDL Goal < 70  . Erectile dysfunction   . GERD (gastroesophageal reflux disease)    has not had problem several months  . History of nonmelanoma skin cancer   . History of nuclear stress test 5/10   no ischemia  . Hypertension     PSH: Past Surgical History:  Procedure Laterality Date  . ANTERIOR CERVICAL DECOMP/DISCECTOMY FUSION N/A 05/29/2013   Procedure: Cervical five-six Anterior cervical decompression/diskectomy/fusion with exploration of Cervical six-seven, hardware removal ;  Surgeon: Erline Levine, MD;  Location: [place] [facility];  Service: Neurosurgery;   Laterality: N/A;  Cervical five-six Anterior cervical decompression/diskectomy/fusion with exploration of Cervical six-seven, Hardware removal   . BACK SURGERY    . CARDIAC CATHETERIZATION     2004 & 2/10, normal LV function  . CERVICAL FUSION    . COLONOSCOPY WITH PROPOFOL N/A 05/14/2014   Procedure: COLONOSCOPY WITH PROPOFOL;  Surgeon: Garlan Fair, MD;  Location: [facility];  Service: Endoscopy;  Laterality: N/A;  . FINGER FRACTURE SURGERY     by Dr. Daylene Katayama  . KNEE ARTHROSCOPY  06/13/2011   Procedure: ARTHROSCOPY KNEE;  Surgeon: Tobi Bastos;  Location: [facility] ORS;  Service: Orthopedics;  Laterality: Right;  Right Knee Arthroscopy with Medial Menisectomy  . NECK SURGERY  05/29/2013  . SHOULDER SURGERY     x3 left / x1 right  . SKIN CANCER EXCISION    . SKIN SURGERY     nose and right ear skin cancer surgery  . TOTAL KNEE ARTHROPLASTY Right 03/30/2015   Procedure: RIGHT TOTAL KNEE ARTHROPLASTY;  Surgeon: Latanya Maudlin, MD;  Location: [facility]S;  Service: Orthopedics;  Laterality: Right;    Social History:  reports that he quit smoking about 17 years ago. He quit smokeless tobacco use about 17 years ago. He reports that he does not drink alcohol or use drugs.  Allergies:  Allergies  Allergen Reactions  . Nsaids     Heart troubles - dr suggested to stop taking  . Percocet [Oxycodone-Acetaminophen] Other (See Comments)    "feel funny"    Medications: No current facility-administered medications for this encounter.    Current Outpatient Prescriptions  Medication Sig Dispense Refill  . ALPRAZolam (XANAX) 0.5 MG tablet Take 0.5 mg by mouth at bedtime as needed for anxiety.    Marland Kitchen aspirin EC 325 MG tablet Take 325 mg by mouth every evening.    Marland Kitchen HYDROcodone-acetaminophen (NORCO/VICODIN) 5-325 MG per tablet Take 1 tablet by mouth every 6 (six) hours as needed for moderate pain. (Patient taking differently: Take 1 tablet by mouth every 8 (eight) hours as needed for moderate pain. ) 60  tablet 0  . losartan (COZAAR) 100 MG tablet Take 50 mg by mouth every evening.     . nitroGLYCERIN (NITROSTAT) 0.4 MG SL tablet Place 1 tablet (0.4 mg total) under the tongue every 5 (five) minutes as needed for chest pain. 25 tablet 4  . pantoprazole (PROTONIX) 40 MG tablet Take 40 mg by mouth daily as needed (for acid reflux).     . rosuvastatin (CRESTOR) 20 MG tablet Take 1 tablet (20 mg total) by mouth daily. (Patient taking differently: Take 20 mg by mouth every evening. ) 90 tablet 36  . tamsulosin (FLOMAX) 0.4 MG CAPS capsule Take 1 capsule by mouth daily.        Review of Systems  Constitutional: Negative.   HENT: Positive for tinnitus.   Eyes: Negative.   Respiratory: Negative.   Cardiovascular: Negative.   Gastrointestinal: Positive for heartburn.  Genitourinary: Positive for frequency.  Musculoskeletal: Positive for back pain and joint pain.  Skin: Negative.   Neurological: Negative.   Endo/Heme/Allergies: Negative.   Psychiatric/Behavioral: The patient is nervous/anxious.       Physical Exam  Constitutional: He is oriented to person, place, and time. He appears well-developed.  HENT:  Head: Normocephalic.  Eyes: Pupils are equal, round, and reactive to light.  Neck: Neck supple. No JVD present. No tracheal deviation present. No thyromegaly present.  Cardiovascular: Normal rate, regular rhythm, normal heart sounds and intact distal pulses.   Respiratory: Effort normal and breath sounds normal. No respiratory distress. He has no wheezes.  GI: Soft. There is no tenderness. There is no guarding.  Musculoskeletal:       Right knee: He exhibits decreased range of motion, swelling, laceration (healed previous incision) and bony tenderness. He exhibits no ecchymosis, no deformity and no erythema. Tenderness found.  Lymphadenopathy:    He has no cervical adenopathy.  Neurological: He is alert and oriented to person, place, and time. A sensory deficit (occassional tingling  in the right foot) is present.  Skin: Skin is warm and dry.  Psychiatric: He has a normal mood and affect.       Assessment/Plan Assessment:    Painful right TKA saphenous nerve  Plan: Patient will undergo an excision of saphenous neuroma and scar tissues with possible poly exchange on 05/08/2016 per Dr. Alvan Dame at [facility]. Risks benefits and expectations were discussed with the patient. Patient understand risks, benefits and expectations and wishes to proceed.   Edward Pugh Kenita Bines   PA-C  04/27/2016, 8:56 PM

## 2016-05-01 ENCOUNTER — Encounter (HOSPITAL_COMMUNITY): Payer: Self-pay

## 2016-05-01 ENCOUNTER — Encounter (HOSPITAL_COMMUNITY)
Admission: RE | Admit: 2016-05-01 | Discharge: 2016-05-01 | Disposition: A | Discharge status: Home / Self Care | Payer: Medicare Other | Source: Ambulatory Visit | Attending: Orthopedic Surgery | Admitting: Orthopedic Surgery

## 2016-05-01 DIAGNOSIS — Z01812 Encounter for preprocedural laboratory examination: Secondary | ICD-10-CM | POA: Insufficient documentation

## 2016-05-01 DIAGNOSIS — M25561 Pain in right knee: Secondary | ICD-10-CM | POA: Diagnosis not present

## 2016-05-01 HISTORY — DX: Herpesviral vesicular dermatitis: B00.1

## 2016-05-01 LAB — CBC
HCT: 41.4 % (ref 39.0–52.0)
Hemoglobin: 13.9 g/dL (ref 13.0–17.0)
MCH: 30 pg (ref 26.0–34.0)
MCHC: 33.6 g/dL (ref 30.0–36.0)
MCV: 89.4 fL (ref 78.0–100.0)
PLATELETS: 175 10*3/uL (ref 150–400)
RBC: 4.63 MIL/uL (ref 4.22–5.81)
RDW: 12.7 % (ref 11.5–15.5)
WBC: 5.3 10*3/uL (ref 4.0–10.5)

## 2016-05-01 LAB — BASIC METABOLIC PANEL
Anion gap: 6 (ref 5–15)
BUN: 8 mg/dL (ref 6–20)
CALCIUM: 9.1 mg/dL (ref 8.9–10.3)
CO2: 27 mmol/L (ref 22–32)
CREATININE: 0.67 mg/dL (ref 0.61–1.24)
Chloride: 107 mmol/L (ref 101–111)
Glucose, Bld: 100 mg/dL — ABNORMAL HIGH (ref 65–99)
Potassium: 4.1 mmol/L (ref 3.5–5.1)
SODIUM: 140 mmol/L (ref 135–145)

## 2016-05-01 LAB — SURGICAL PCR SCREEN
MRSA, PCR: NEGATIVE
Staphylococcus aureus: NEGATIVE

## 2016-05-08 ENCOUNTER — Inpatient Hospital Stay (HOSPITAL_COMMUNITY): Payer: Medicare Other | Admitting: Anesthesiology

## 2016-05-08 ENCOUNTER — Encounter (HOSPITAL_COMMUNITY)
Admission: RE | Disposition: A | Discharge status: Home Health | Payer: Self-pay | Source: Ambulatory Visit | Attending: Orthopedic Surgery

## 2016-05-08 ENCOUNTER — Encounter (HOSPITAL_COMMUNITY): Payer: Self-pay | Admitting: *Deleted

## 2016-05-08 ENCOUNTER — Inpatient Hospital Stay (HOSPITAL_COMMUNITY)
Admission: RE | Admit: 2016-05-08 | Discharge: 2016-05-09 | DRG: 489 | Disposition: A | Discharge status: Home Health | Payer: Medicare Other | Source: Ambulatory Visit | Attending: Orthopedic Surgery | Admitting: Orthopedic Surgery

## 2016-05-08 DIAGNOSIS — Y831 Surgical operation with implant of artificial internal device as the cause of abnormal reaction of the patient, or of later complication, without mention of misadventure at the time of the procedure: Secondary | ICD-10-CM | POA: Diagnosis present

## 2016-05-08 DIAGNOSIS — Z87891 Personal history of nicotine dependence: Secondary | ICD-10-CM

## 2016-05-08 DIAGNOSIS — Z85828 Personal history of other malignant neoplasm of skin: Secondary | ICD-10-CM | POA: Diagnosis not present

## 2016-05-08 DIAGNOSIS — F40240 Claustrophobia: Secondary | ICD-10-CM | POA: Diagnosis present

## 2016-05-08 DIAGNOSIS — T8484XA Pain due to internal orthopedic prosthetic devices, implants and grafts, initial encounter: Principal | ICD-10-CM | POA: Diagnosis present

## 2016-05-08 DIAGNOSIS — I10 Essential (primary) hypertension: Secondary | ICD-10-CM | POA: Diagnosis present

## 2016-05-08 DIAGNOSIS — E7800 Pure hypercholesterolemia, unspecified: Secondary | ICD-10-CM | POA: Diagnosis present

## 2016-05-08 DIAGNOSIS — D3613 Benign neoplasm of peripheral nerves and autonomic nervous system of lower limb, including hip: Secondary | ICD-10-CM | POA: Diagnosis present

## 2016-05-08 DIAGNOSIS — I2510 Atherosclerotic heart disease of native coronary artery without angina pectoris: Secondary | ICD-10-CM | POA: Diagnosis present

## 2016-05-08 DIAGNOSIS — M25561 Pain in right knee: Secondary | ICD-10-CM | POA: Diagnosis present

## 2016-05-08 DIAGNOSIS — Z96651 Presence of right artificial knee joint: Secondary | ICD-10-CM

## 2016-05-08 DIAGNOSIS — K219 Gastro-esophageal reflux disease without esophagitis: Secondary | ICD-10-CM | POA: Diagnosis present

## 2016-05-08 DIAGNOSIS — Z86010 Personal history of colonic polyps: Secondary | ICD-10-CM

## 2016-05-08 HISTORY — PX: SCAR DEBRIDEMENT OF TOTAL KNEE: SHX6544

## 2016-05-08 HISTORY — PX: I & D KNEE WITH POLY EXCHANGE: SHX5024

## 2016-05-08 LAB — TYPE AND SCREEN
ABO/RH(D): O POS
Antibody Screen: NEGATIVE

## 2016-05-08 SURGERY — REVISION, SCAR, KNEE
Anesthesia: Monitor Anesthesia Care | Site: Knee | Laterality: Right

## 2016-05-08 MED ORDER — ALUM & MAG HYDROXIDE-SIMETH 200-200-20 MG/5ML PO SUSP: 30.0000 mL | ORAL | Status: DC | PRN

## 2016-05-08 MED ORDER — LIDOCAINE 2% (20 MG/ML) 5 ML SYRINGE
INTRAMUSCULAR | Status: DC | PRN
  Administered 2016-05-08: 60 mg via INTRAVENOUS

## 2016-05-08 MED ORDER — HYDROMORPHONE HCL 1 MG/ML IJ SOLN: 0.5000 mg | INTRAMUSCULAR | Status: DC | PRN

## 2016-05-08 MED ORDER — FENTANYL CITRATE (PF) 100 MCG/2ML IJ SOLN
INTRAMUSCULAR | Status: DC | PRN
  Administered 2016-05-08 (×2): 50 ug via INTRAVENOUS

## 2016-05-08 MED ORDER — LOSARTAN POTASSIUM 50 MG PO TABS
50.0000 mg | ORAL_TABLET | Freq: Every evening | ORAL | Status: DC
  Filled 2016-05-08: qty 1

## 2016-05-08 MED ORDER — BUPIVACAINE HCL (PF) 0.25 % IJ SOLN
INTRAMUSCULAR | Status: AC
  Filled 2016-05-08: qty 30

## 2016-05-08 MED ORDER — PROPOFOL 10 MG/ML IV BOLUS
INTRAVENOUS | Status: AC
  Filled 2016-05-08: qty 20

## 2016-05-08 MED ORDER — DOCUSATE SODIUM 100 MG PO CAPS
100.0000 mg | ORAL_CAPSULE | Freq: Two times a day (BID) | ORAL | Status: DC
  Administered 2016-05-08 – 2016-05-09 (×2): 100 mg via ORAL
  Filled 2016-05-08 (×2): qty 1

## 2016-05-08 MED ORDER — ONDANSETRON HCL 4 MG/2ML IJ SOLN: 4.0000 mg | Freq: Four times a day (QID) | INTRAMUSCULAR | Status: DC | PRN

## 2016-05-08 MED ORDER — LIDOCAINE 2% (20 MG/ML) 5 ML SYRINGE
INTRAMUSCULAR | Status: AC
  Filled 2016-05-08: qty 5

## 2016-05-08 MED ORDER — PROPOFOL 10 MG/ML IV BOLUS
INTRAVENOUS | Status: AC
  Filled 2016-05-08: qty 40

## 2016-05-08 MED ORDER — CEFAZOLIN SODIUM-DEXTROSE 2-4 GM/100ML-% IV SOLN
2.0000 g | Freq: Four times a day (QID) | INTRAVENOUS | Status: AC
  Administered 2016-05-08 – 2016-05-09 (×2): 2 g via INTRAVENOUS
  Filled 2016-05-08 (×2): qty 100

## 2016-05-08 MED ORDER — PANTOPRAZOLE SODIUM 40 MG PO TBEC: 40.0000 mg | DELAYED_RELEASE_TABLET | Freq: Every day | ORAL | Status: DC | PRN

## 2016-05-08 MED ORDER — KETOROLAC TROMETHAMINE 30 MG/ML IJ SOLN
INTRAMUSCULAR | Status: DC | PRN
  Administered 2016-05-08: 30 mg

## 2016-05-08 MED ORDER — HYDROCODONE-ACETAMINOPHEN 7.5-325 MG PO TABS
1.0000 | ORAL_TABLET | ORAL | Status: DC
  Administered 2016-05-08: 2 via ORAL
  Administered 2016-05-08: 1 via ORAL
  Administered 2016-05-09 (×3): 2 via ORAL
  Filled 2016-05-08 (×4): qty 2
  Filled 2016-05-08: qty 1

## 2016-05-08 MED ORDER — NITROGLYCERIN 0.4 MG SL SUBL: 0.4000 mg | SUBLINGUAL_TABLET | SUBLINGUAL | Status: DC | PRN

## 2016-05-08 MED ORDER — SODIUM CHLORIDE 0.9 % IJ SOLN
INTRAMUSCULAR | Status: DC | PRN
Start: 2016-05-08 — End: 2016-05-08
  Administered 2016-05-08: 29 mL

## 2016-05-08 MED ORDER — ONDANSETRON HCL 4 MG/2ML IJ SOLN
INTRAMUSCULAR | Status: AC
  Filled 2016-05-08: qty 2

## 2016-05-08 MED ORDER — PHENYLEPHRINE HCL 10 MG/ML IJ SOLN
INTRAMUSCULAR | Status: DC | PRN
  Administered 2016-05-08: 40 ug via INTRAVENOUS

## 2016-05-08 MED ORDER — METHOCARBAMOL 500 MG PO TABS: 500.0000 mg | ORAL_TABLET | Freq: Four times a day (QID) | ORAL | Status: DC | PRN

## 2016-05-08 MED ORDER — EPHEDRINE SULFATE 50 MG/ML IJ SOLN
INTRAMUSCULAR | Status: DC | PRN
  Administered 2016-05-08 (×3): 5 mg via INTRAVENOUS

## 2016-05-08 MED ORDER — FENTANYL CITRATE (PF) 100 MCG/2ML IJ SOLN
INTRAMUSCULAR | Status: AC
  Filled 2016-05-08: qty 2

## 2016-05-08 MED ORDER — DIPHENHYDRAMINE HCL 25 MG PO CAPS: 25.0000 mg | ORAL_CAPSULE | Freq: Four times a day (QID) | ORAL | Status: DC | PRN

## 2016-05-08 MED ORDER — METOCLOPRAMIDE HCL 5 MG/ML IJ SOLN: 5.0000 mg | Freq: Three times a day (TID) | INTRAMUSCULAR | Status: DC | PRN

## 2016-05-08 MED ORDER — SODIUM CHLORIDE 0.9 % IV SOLN
INTRAVENOUS | Status: DC
  Administered 2016-05-08: 21:00:00 via INTRAVENOUS
  Filled 2016-05-08 (×3): qty 1000

## 2016-05-08 MED ORDER — METOCLOPRAMIDE HCL 5 MG PO TABS: 5.0000 mg | ORAL_TABLET | Freq: Three times a day (TID) | ORAL | Status: DC | PRN

## 2016-05-08 MED ORDER — ASPIRIN 81 MG PO CHEW
81.0000 mg | CHEWABLE_TABLET | Freq: Two times a day (BID) | ORAL | Status: DC
  Administered 2016-05-09: 81 mg via ORAL
  Filled 2016-05-08: qty 1

## 2016-05-08 MED ORDER — HYDROMORPHONE HCL 1 MG/ML IJ SOLN: 0.2500 mg | INTRAMUSCULAR | Status: DC | PRN

## 2016-05-08 MED ORDER — ROSUVASTATIN CALCIUM 20 MG PO TABS
20.0000 mg | ORAL_TABLET | Freq: Every day | ORAL | Status: DC
  Administered 2016-05-08: 20 mg via ORAL
  Filled 2016-05-08: qty 1

## 2016-05-08 MED ORDER — KETOROLAC TROMETHAMINE 30 MG/ML IJ SOLN
INTRAMUSCULAR | Status: AC
  Filled 2016-05-08: qty 1

## 2016-05-08 MED ORDER — PHENOL 1.4 % MT LIQD
1.0000 | OROMUCOSAL | Status: DC | PRN
  Filled 2016-05-08: qty 177

## 2016-05-08 MED ORDER — ROCURONIUM BROMIDE 50 MG/5ML IV SOSY
PREFILLED_SYRINGE | INTRAVENOUS | Status: AC
  Filled 2016-05-08: qty 5

## 2016-05-08 MED ORDER — CEFAZOLIN SODIUM-DEXTROSE 2-4 GM/100ML-% IV SOLN
INTRAVENOUS | Status: AC
  Filled 2016-05-08: qty 100

## 2016-05-08 MED ORDER — FERROUS SULFATE 325 (65 FE) MG PO TABS
325.0000 mg | ORAL_TABLET | Freq: Three times a day (TID) | ORAL | Status: DC
  Administered 2016-05-09: 325 mg via ORAL
  Filled 2016-05-08: qty 1

## 2016-05-08 MED ORDER — STERILE WATER FOR IRRIGATION IR SOLN
Status: DC | PRN
  Administered 2016-05-08: 2000 mL

## 2016-05-08 MED ORDER — 0.9 % SODIUM CHLORIDE (POUR BTL) OPTIME
TOPICAL | Status: DC | PRN
  Administered 2016-05-08: 1000 mL

## 2016-05-08 MED ORDER — BUPIVACAINE IN DEXTROSE 0.75-8.25 % IT SOLN
INTRATHECAL | Status: DC | PRN
  Administered 2016-05-08: 15 mg via INTRATHECAL

## 2016-05-08 MED ORDER — EPHEDRINE 5 MG/ML INJ
INTRAVENOUS | Status: AC
  Filled 2016-05-08: qty 10

## 2016-05-08 MED ORDER — DEXAMETHASONE SODIUM PHOSPHATE 10 MG/ML IJ SOLN
INTRAMUSCULAR | Status: AC
  Filled 2016-05-08: qty 1

## 2016-05-08 MED ORDER — SODIUM CHLORIDE 0.9 % IR SOLN
Status: DC | PRN
  Administered 2016-05-08: 1000 mL

## 2016-05-08 MED ORDER — TAMSULOSIN HCL 0.4 MG PO CAPS
0.4000 mg | ORAL_CAPSULE | Freq: Every day | ORAL | Status: DC
  Administered 2016-05-08 – 2016-05-09 (×2): 0.4 mg via ORAL
  Filled 2016-05-08 (×2): qty 1

## 2016-05-08 MED ORDER — SUCCINYLCHOLINE CHLORIDE 20 MG/ML IJ SOLN
INTRAMUSCULAR | Status: AC
  Filled 2016-05-08: qty 1

## 2016-05-08 MED ORDER — CEFAZOLIN SODIUM-DEXTROSE 2-4 GM/100ML-% IV SOLN
2.0000 g | INTRAVENOUS | Status: AC
  Administered 2016-05-08: 2 g via INTRAVENOUS
  Filled 2016-05-08: qty 100

## 2016-05-08 MED ORDER — MIDAZOLAM HCL 5 MG/5ML IJ SOLN
INTRAMUSCULAR | Status: DC | PRN
  Administered 2016-05-08 (×2): 1 mg via INTRAVENOUS

## 2016-05-08 MED ORDER — SODIUM CHLORIDE 0.9 % IJ SOLN
INTRAMUSCULAR | Status: AC
  Filled 2016-05-08: qty 50

## 2016-05-08 MED ORDER — BISACODYL 10 MG RE SUPP: 10.0000 mg | Freq: Every day | RECTAL | Status: DC | PRN

## 2016-05-08 MED ORDER — MENTHOL 3 MG MT LOZG: 1.0000 | LOZENGE | OROMUCOSAL | Status: DC | PRN

## 2016-05-08 MED ORDER — ALPRAZOLAM 0.5 MG PO TABS: 0.5000 mg | ORAL_TABLET | Freq: Every evening | ORAL | Status: DC | PRN

## 2016-05-08 MED ORDER — TRANEXAMIC ACID 1000 MG/10ML IV SOLN
1000.0000 mg | INTRAVENOUS | Status: AC
  Administered 2016-05-08: 1000 mg via INTRAVENOUS
  Filled 2016-05-08: qty 10

## 2016-05-08 MED ORDER — POLYETHYLENE GLYCOL 3350 17 G PO PACK
17.0000 g | PACK | Freq: Two times a day (BID) | ORAL | Status: DC
  Administered 2016-05-08 – 2016-05-09 (×2): 17 g via ORAL
  Filled 2016-05-08 (×2): qty 1

## 2016-05-08 MED ORDER — BUPIVACAINE HCL (PF) 0.25 % IJ SOLN
INTRAMUSCULAR | Status: DC | PRN
  Administered 2016-05-08: 30 mL

## 2016-05-08 MED ORDER — MIDAZOLAM HCL 2 MG/2ML IJ SOLN
INTRAMUSCULAR | Status: AC
  Filled 2016-05-08: qty 2

## 2016-05-08 MED ORDER — MAGNESIUM CITRATE PO SOLN: 1.0000 | Freq: Once | ORAL | Status: DC | PRN

## 2016-05-08 MED ORDER — ONDANSETRON HCL 4 MG PO TABS: 4.0000 mg | ORAL_TABLET | Freq: Four times a day (QID) | ORAL | Status: DC | PRN

## 2016-05-08 MED ORDER — DEXTROSE 5 % IV SOLN
500.0000 mg | Freq: Four times a day (QID) | INTRAVENOUS | Status: DC | PRN
  Administered 2016-05-08: 500 mg via INTRAVENOUS
  Filled 2016-05-08: qty 550
  Filled 2016-05-08: qty 5

## 2016-05-08 MED ORDER — DEXAMETHASONE SODIUM PHOSPHATE 10 MG/ML IJ SOLN
10.0000 mg | Freq: Once | INTRAMUSCULAR | Status: AC
  Administered 2016-05-08: 10 mg via INTRAVENOUS

## 2016-05-08 MED ORDER — ONDANSETRON HCL 4 MG/2ML IJ SOLN
INTRAMUSCULAR | Status: DC | PRN
  Administered 2016-05-08: 4 mg via INTRAVENOUS

## 2016-05-08 MED ORDER — PROPOFOL 500 MG/50ML IV EMUL
INTRAVENOUS | Status: DC | PRN
  Administered 2016-05-08: 75 ug/kg/min via INTRAVENOUS

## 2016-05-08 MED ORDER — DEXAMETHASONE SODIUM PHOSPHATE 10 MG/ML IJ SOLN: 10.0000 mg | Freq: Once | INTRAMUSCULAR | Status: DC

## 2016-05-08 MED ORDER — LACTATED RINGERS IV SOLN
INTRAVENOUS | Status: DC
  Administered 2016-05-08 (×2): via INTRAVENOUS

## 2016-05-08 SURGICAL SUPPLY — 50 items
ADH SKN CLS APL DERMABOND .7 (GAUZE/BANDAGES/DRESSINGS) ×1
BAG SPEC THK2 15X12 ZIP CLS (MISCELLANEOUS) ×1
BAG ZIPLOCK 12X15 (MISCELLANEOUS) ×3 IMPLANT
BANDAGE ACE 6X5 VEL STRL LF (GAUZE/BANDAGES/DRESSINGS) ×3 IMPLANT
CLOTH BEACON ORANGE TIMEOUT ST (SAFETY) ×3 IMPLANT
CUFF TOURN SGL QUICK 34 (TOURNIQUET CUFF) ×3
CUFF TRNQT CYL 34X4X40X1 (TOURNIQUET CUFF) ×1 IMPLANT
DECANTER SPIKE VIAL GLASS SM (MISCELLANEOUS) ×3 IMPLANT
DERMABOND ADVANCED (GAUZE/BANDAGES/DRESSINGS) ×2
DERMABOND ADVANCED .7 DNX12 (GAUZE/BANDAGES/DRESSINGS) IMPLANT
DRAPE U-SHAPE 47X51 STRL (DRAPES) ×3 IMPLANT
DRESSING AQUACEL AG SP 3.5X10 (GAUZE/BANDAGES/DRESSINGS) IMPLANT
DRSG AQUACEL AG SP 3.5X10 (GAUZE/BANDAGES/DRESSINGS) ×3
DURAPREP 26ML APPLICATOR (WOUND CARE) ×6 IMPLANT
ELECT REM PT RETURN 9FT ADLT (ELECTROSURGICAL) ×3
ELECTRODE REM PT RTRN 9FT ADLT (ELECTROSURGICAL) ×1 IMPLANT
GLOVE BIOGEL PI IND STRL 7.0 (GLOVE) IMPLANT
GLOVE BIOGEL PI IND STRL 7.5 (GLOVE) ×1 IMPLANT
GLOVE BIOGEL PI IND STRL 8 (GLOVE) IMPLANT
GLOVE BIOGEL PI INDICATOR 7.0 (GLOVE) ×2
GLOVE BIOGEL PI INDICATOR 7.5 (GLOVE) ×8
GLOVE BIOGEL PI INDICATOR 8 (GLOVE) ×2
GLOVE ECLIPSE 8.0 STRL XLNG CF (GLOVE) ×3 IMPLANT
GLOVE ORTHO TXT STRL SZ7.5 (GLOVE) ×3 IMPLANT
GLOVE SURG SS PI 7.0 STRL IVOR (GLOVE) ×2 IMPLANT
GLOVE SURG SS PI 7.5 STRL IVOR (GLOVE) ×2 IMPLANT
GOWN STRL REUS W/ TWL XL LVL3 (GOWN DISPOSABLE) IMPLANT
GOWN STRL REUS W/TWL LRG LVL3 (GOWN DISPOSABLE) ×3 IMPLANT
GOWN STRL REUS W/TWL XL LVL3 (GOWN DISPOSABLE) ×8 IMPLANT
HANDPIECE INTERPULSE COAX TIP (DISPOSABLE) ×3
MANIFOLD NEPTUNE II (INSTRUMENTS) ×3 IMPLANT
NDL SAFETY ECLIPSE 18X1.5 (NEEDLE) IMPLANT
NEEDLE HYPO 18GX1.5 SHARP (NEEDLE)
PACK TOTAL KNEE CUSTOM (KITS) ×3 IMPLANT
PLATE ROT INSERT 10MM SIZE 4 (Plate) ×2 IMPLANT
POSITIONER SURGICAL ARM (MISCELLANEOUS) ×3 IMPLANT
SET HNDPC FAN SPRY TIP SCT (DISPOSABLE) ×1 IMPLANT
SPONGE LAP 18X18 X RAY DECT (DISPOSABLE) IMPLANT
SUT MNCRL AB 3-0 PS2 18 (SUTURE) IMPLANT
SUT MNCRL AB 4-0 PS2 18 (SUTURE) IMPLANT
SUT VIC AB 1 CT1 36 (SUTURE) ×3 IMPLANT
SUT VIC AB 2-0 CT1 27 (SUTURE) ×9
SUT VIC AB 2-0 CT1 TAPERPNT 27 (SUTURE) ×3 IMPLANT
SUT VLOC 180 0 24IN GS25 (SUTURE) IMPLANT
SWAB COLLECTION DEVICE MRSA (MISCELLANEOUS) IMPLANT
SWAB CULTURE ESWAB REG 1ML (MISCELLANEOUS) IMPLANT
SYR 50ML LL SCALE MARK (SYRINGE) ×3 IMPLANT
TRAY FOLEY W/METER SILVER 16FR (SET/KITS/TRAYS/PACK) ×3 IMPLANT
WRAP KNEE MAXI GEL POST OP (GAUZE/BANDAGES/DRESSINGS) ×3 IMPLANT
YANKAUER SUCT BULB TIP 10FT TU (MISCELLANEOUS) ×2 IMPLANT

## 2016-05-08 NOTE — Anesthesia Postprocedure Evaluation (Signed)
Anesthesia Post Note  Patient: Edward [name]  Procedure(s) Performed: Procedure(s) (LRB): SCAR DEBRIDEMENT OF TOTAL KNEE, EXCISION OF SAPHENOUS NEUROMA (Right) POLY EXCHANGE (Right)  Patient location during evaluation: PACU Anesthesia Type: Spinal and MAC Level of consciousness: awake and alert Pain management: pain level controlled Vital Signs Assessment: post-procedure vital signs reviewed and stable Respiratory status: spontaneous breathing and respiratory function stable Cardiovascular status: blood pressure returned to baseline and stable Postop Assessment: spinal receding Anesthetic complications: no    Last Vitals:  Vitals:   05/08/16 1615 05/08/16 1630  BP: 118/69 127/75  Pulse: (!) 54 (!) 55  Resp: 16 16  Temp:      Last Pain:  Vitals:   05/08/16 1055  TempSrc:   PainSc: 4                  Chistopher Mangino,W. EDMOND

## 2016-05-08 NOTE — Brief Op Note (Signed)
05/08/2016  3:14 PM  PATIENT:  Edward [name]  65 y.o. male  PRE-OPERATIVE DIAGNOSIS:  painful right total knee, right saphenous nerve  POST-OPERATIVE DIAGNOSIS:  painful right total knee, right saphenous nerve  PROCEDURE:  Procedure(s): SCAR DEBRIDEMENT OF TOTAL KNEE, EXCISION OF SAPHENOUS NEUROMA (Right) POLY EXCHANGE (Right)  SURGEON:  Surgeon(s) and Role:    * Paralee Cancel, MD - Primary  PHYSICIAN ASSISTANT: Molli Barrows, PA_C  ANESTHESIA:   spinal  EBL:  Total I/O In: 1000 [I.V.:1000] Out: 150 [Urine:100; Blood:50]  BLOOD ADMINISTERED:none  DRAINS: none   LOCAL MEDICATIONS USED:  MARCAINE     SPECIMEN:  No Specimen  DISPOSITION OF SPECIMEN:  N/A  COUNTS:  YES  TOURNIQUET:   Total Tourniquet Time Documented: Thigh (Right) - 19 minutes Total: Thigh (Right) - 19 minutes   DICTATION: .Other Dictation: Dictation Number [id number]  PLAN OF CARE: Admit to inpatient   PATIENT DISPOSITION:  PACU - hemodynamically stable.   Delay start of Pharmacological VTE agent (>24hrs) due to surgical blood loss or risk of bleeding: no

## 2016-05-08 NOTE — Anesthesia Procedure Notes (Signed)
Spinal  Patient location during procedure: OR Start time: 05/08/2016 2:09 PM End time: 05/08/2016 2:13 PM Staffing Anesthesiologist: Roderic Palau Performed: anesthesiologist  Preanesthetic Checklist Completed: patient identified, surgical consent, pre-op evaluation, timeout performed, IV checked, risks and benefits discussed and monitors and equipment checked Spinal Block Patient position: sitting Prep: DuraPrep Patient monitoring: cardiac monitor, continuous pulse ox and blood pressure Approach: midline Location: L3-4 Injection technique: single-shot Needle Needle type: Pencan  Needle gauge: 24 G Needle length: 9 cm Assessment Sensory level: T8 Additional Notes Functioning IV was confirmed and monitors were applied. Sterile prep and drape, including hand hygiene and sterile gloves were used. The patient was positioned and the spine was prepped. The skin was anesthetized with lidocaine.  Free flow of clear CSF was obtained prior to injecting local anesthetic into the CSF.  The spinal needle aspirated freely following injection.  The needle was carefully withdrawn.  The patient tolerated the procedure well.

## 2016-05-08 NOTE — Transfer of Care (Signed)
Immediate Anesthesia Transfer of Care Note  Patient: Edward [name]  Procedure(s) Performed: Procedure(s): SCAR DEBRIDEMENT OF TOTAL KNEE, EXCISION OF SAPHENOUS NEUROMA (Right) POLY EXCHANGE (Right)  Patient Location: PACU  Anesthesia Type:MAC  Level of Consciousness:  sedated, patient cooperative and responds to stimulation  Airway & Oxygen Therapy:Patient Spontanous Breathing and Patient connected to face mask oxgen  Post-op Assessment:  Report given to PACU RN and Post -op Vital signs reviewed and stable  Post vital signs:  Reviewed and stable  Last Vitals:  Vitals:   05/08/16 1050 05/08/16 1537  BP: 125/77 (P) 105/67  Pulse: 65 (P) 65  Resp: 18 (P) 17  Temp: 36.9 C (P) [id number] C    Complications: No apparent anesthesia complications

## 2016-05-08 NOTE — Anesthesia Procedure Notes (Addendum)
Procedure Name: MAC Date/Time: 05/08/2016 2:14 PM Performed by: Dione Booze Pre-anesthesia Checklist: Patient identified, Emergency Drugs available, Suction available and Patient being monitored Oxygen Delivery Method: Simple face mask Placement Confirmation: positive ETCO2

## 2016-05-08 NOTE — Anesthesia Preprocedure Evaluation (Addendum)
Anesthesia Evaluation  Patient identified by MRN, date of birth, ID band Patient awake    Reviewed: Allergy & Precautions, H&P , NPO status , Patient's Chart, lab work & pertinent test results  Airway Mallampati: II  TM Distance: >3 FB Neck ROM: Full    Dental no notable dental hx. (+) Teeth Intact, Dental Advisory Given   Pulmonary neg pulmonary ROS, former smoker,    Pulmonary exam normal breath sounds clear to auscultation       Cardiovascular hypertension, + CAD   Rhythm:Regular Rate:Normal     Neuro/Psych Anxiety negative neurological ROS  negative psych ROS   GI/Hepatic Neg liver ROS, GERD  Medicated and Controlled,  Endo/Other  negative endocrine ROS  Renal/GU negative Renal ROS  negative genitourinary   Musculoskeletal  (+) Arthritis , Osteoarthritis,    Abdominal   Peds  Hematology negative hematology ROS (+)   Anesthesia Other Findings   Reproductive/Obstetrics negative OB ROS                            Anesthesia Physical Anesthesia Plan  ASA: III  Anesthesia Plan: MAC and Spinal   Post-op Pain Management:    Induction: Intravenous  Airway Management Planned: Simple Face Mask  Additional Equipment:   Intra-op Plan:   Post-operative Plan:   Informed Consent: I have reviewed the patients History and Physical, chart, labs and discussed the procedure including the risks, benefits and alternatives for the proposed anesthesia with the patient or authorized representative who has indicated his/her understanding and acceptance.   Dental advisory given  Plan Discussed with: CRNA  Anesthesia Plan Comments:        Anesthesia Quick Evaluation

## 2016-05-08 NOTE — Interval H&P Note (Signed)
History and Physical Interval Note:  05/08/2016 1:22 PM  Edward [name]  has presented today for surgery, with the diagnosis of painful right total knee right saphenous nerve  The various methods of treatment have been discussed with the patient and family. After consideration of risks, benefits and other options for treatment, the patient has consented to  Procedure(s): [place], excision of saphenous neuroma (Right) possible,POLY EXCHANGE (Right) as a surgical intervention .  The patient's history has been reviewed, patient examined, no change in status, stable for surgery.  I have reviewed the patient's chart and labs.  Questions were answered to the patient's satisfaction.     Mauri Pole

## 2016-05-09 LAB — BASIC METABOLIC PANEL
ANION GAP: 6 (ref 5–15)
BUN: 12 mg/dL (ref 6–20)
CALCIUM: 8.7 mg/dL — AB (ref 8.9–10.3)
CHLORIDE: 108 mmol/L (ref 101–111)
CO2: 25 mmol/L (ref 22–32)
Creatinine, Ser: 0.71 mg/dL (ref 0.61–1.24)
GFR calc non Af Amer: >60 mL/min (ref >60)
Glucose, Bld: 160 mg/dL — ABNORMAL HIGH (ref 65–99)
Potassium: 4.1 mmol/L (ref 3.5–5.1)
Sodium: 139 mmol/L (ref 135–145)

## 2016-05-09 LAB — CBC
HEMATOCRIT: 36.7 % — AB (ref 39.0–52.0)
HEMOGLOBIN: 12.5 g/dL — AB (ref 13.0–17.0)
MCH: 29.9 pg (ref 26.0–34.0)
MCHC: 34.1 g/dL (ref 30.0–36.0)
MCV: 87.8 fL (ref 78.0–100.0)
Platelets: 148 10*3/uL — ABNORMAL LOW (ref 150–400)
RBC: 4.18 MIL/uL — AB (ref 4.22–5.81)
RDW: 12.5 % (ref 11.5–15.5)
WBC: 11.9 10*3/uL — AB (ref 4.0–10.5)

## 2016-05-09 MED ORDER — HYDROCODONE-ACETAMINOPHEN 7.5-325 MG PO TABS: 1.0000 | ORAL_TABLET | ORAL | 0 refills | Status: DC | PRN

## 2016-05-09 MED ORDER — DOCUSATE SODIUM 100 MG PO CAPS: 100.0000 mg | ORAL_CAPSULE | Freq: Two times a day (BID) | ORAL | 0 refills | Status: DC

## 2016-05-09 MED ORDER — ASPIRIN 81 MG PO CHEW: 81.0000 mg | CHEWABLE_TABLET | Freq: Two times a day (BID) | ORAL | 0 refills | Status: AC

## 2016-05-09 MED ORDER — METHOCARBAMOL 500 MG PO TABS: 500.0000 mg | ORAL_TABLET | Freq: Four times a day (QID) | ORAL | 0 refills | Status: DC | PRN

## 2016-05-09 MED ORDER — FERROUS SULFATE 325 (65 FE) MG PO TABS: 325.0000 mg | ORAL_TABLET | Freq: Three times a day (TID) | ORAL | 3 refills | Status: DC

## 2016-05-09 MED ORDER — POLYETHYLENE GLYCOL 3350 17 G PO PACK: 17.0000 g | PACK | Freq: Two times a day (BID) | ORAL | 0 refills | Status: DC

## 2016-05-09 NOTE — Progress Notes (Signed)
Pt was discharged home today. Instructions were reviewed with patient, prescriptions were given to patient and questions were answered. Pt was taken to main entrance via wheelchair by NT and RN [place]

## 2016-05-09 NOTE — Op Note (Signed)
Edward [name], Edward [name] NO.:  [id number]  MEDICAL RECORD NO.:  [id number]  LOCATION:  70                         FACILITY:  [facility]  PHYSICIAN:  Pietro Cassis. Alvan Dame, M.D.  DATE OF BIRTH:  1951/03/03  DATE OF PROCEDURE:  05/08/2016 DATE OF DISCHARGE:                              OPERATIVE REPORT   PREOPERATIVE DIAGNOSIS:  Painful right total knee arthroplasty with limited range of motion in combination with hypersensitivity of the anteromedial aspect knee consistent with scarring of the saphenous nerve branches.  POSTOPERATIVE DIAGNOSIS:  Painful right total knee arthroplasty with limited range of motion in combination with hypersensitivity of the anteromedial aspect knee consistent with scarring of the saphenous nerve branches.  PROCEDURE: 1. Excision of scarred and excessive saphenous nerve branches over the     anterior aspect of the joint. 2. Right knee revision with excision of scar and polyethylene     exchange, right knee, replacing the size 4 x 10 mm posterior     stabilized insert.  SURGEON:  Pietro Cassis. Alvan Dame, M.D.  ASSISTANT:  Judith Part. Chabon, PA-C.  ANESTHESIA:  Spinal.  SPECIMENS:  None.  COMPLICATIONS:  None.  DRAINS:  None.  TOURNIQUET TIME:  19 minutes at 250 mmHg.  INDICATIONS FOR PROCEDURE:  Edward [name] is a 65 year old male who presented to the office for evaluation of painful right total knee replacement.  He was seen and evaluated as a second opinion.  His incision was healed.  He had no postoperative fevers, chills, night sweats, or wound drainage.  He was noted on exam to have hypersensitivity over the anterior medial aspect knee in the distribution of the saphenous nerve branches.  Marcaine injection in the office had yielded 4 hours of relief of his pain.  In additional complaint, he had limited range of motion both in extension and flexion.  After having a lengthy discussion with Edward [name] regarding the pros and cons of  proceeding with any revision type surgery, he wished to proceed with surgery for attempted pain relief as well as improve motion.  Consent was obtained.  Limitations of the procedure were discussed including potential recurrence of pain over the anterior medial aspect of the knee as well as on the medial side of his leg as well as recurrence of the stiffness in the knee despite his efforts at therapy.  He did not have any evidence of infection on preoperative workup or loosening by bone scan.  Consent was obtained.  PROCEDURE IN DETAIL:  The patient was brought to the operative theater. Once adequate anesthesia, preoperative antibiotics, Ancef, 1 g of tranexamic acid, and 10 mg of Decadron administered, he was positioned supine with a right thigh tourniquet placed.  The right lower extremity was then prepped and draped in sterile fashion.  Time-out was performed identifying the patient, planned procedure, and extremity.  Leg was exsanguinated.  Tourniquet elevated to 250 mmHg.  His old incision was excised.  Soft tissue plane was created.  At this point after I had demarcated in the preoperative area the area of most significant hypersensitivity, I focused on this area, debrided this out, and not only excising any of the  subcutaneous fat, which was very limited based on this thin nature, but also using the sharp knife to excise subcutaneous tissues to try to hopefully denervate this area further.  Once I had satisfactorily debrided this area as best of what I knew to do, I then made a median arthrotomy and opened up the knee joint.  The knee joint was clear synovial fluid.  The first portion of the case was establishing medial, lateral, and suprapatellar pouches and gutters. The synovectomy and scar debris were carried out, it included the area around the patella over the lateral facet, removing some bony overgrowth in this area.  Once I had adequately debrided the knee, I did remove  the old polyethylene insert, it was a 4 x 10 mm insert.  I did this based on the debridements and the contact with knife and Bovie.  The old polyethylene was removed and based on the trial reduction during this interval, I selected the 4 x 10 mm insert.  Following debridement of posterior scar and finalizing any further workup, we irrigated the knee with normal saline solution.  I injected the synovial capsule junction of the knee with 30 mL of 0.25% Marcaine with epinephrine, 1 mL of Toradol, and 30 mL of normal saline into the subcutaneous tissues and periosteum region.  The final 4 x 10 mm insert was then placed into the tibial tray and the knee reduced.  Please note that the patella, femoral, and tibial components were noted to be stable.  The knee was reduced.  His final range of motion of the knee appeared at this point to come to full extension and flexed with this capsule overlying the knee with the patella located fully to 130 to 140 degrees.  At this point, we re-irrigated the knee, tourniquet was let down after 19 minutes.  The extensor mechanism was then reapproximated using #1 Vicryl and 0 V-Loc suture.  The remainder of the wound was closed with 2- 0 Vicryl and a running 3-0 Monocryl.  The knee was cleaned, dried, and dressed sterilely using surgical glue and Aquacel dressing.  He was then brought to the recovery room in stable condition tolerating the procedure well.  Findings were reviewed with family.  Plan to be in hospital for antibiotics as well as initial assessment with Physical Therapy prior to discharge home.     Pietro Cassis Alvan Dame, M.D.     MDO/MEDQ  D:  05/08/2016  T:  05/09/2016  Job:  [id number]

## 2016-05-09 NOTE — Evaluation (Signed)
Physical Therapy Evaluation Patient Details Name: Edward [name] MRN: [id number] DOB: 23-Feb-1951 Today's Date: 05/09/2016   History of Present Illness   right knee poly exchange and scar debridement  Clinical Impression  Patient evaluated by Physical Therapy with no further acute PT needs identified. All education has been completed and the patient has no further questions. * See below for any follow-up Physical Therapy or equipment needs. PT is signing off. Thank you for this referral.     Follow Up Recommendations Outpatient PT    Equipment Recommendations  Rolling walker with 5" wheels    Recommendations for Other Services       Precautions / Restrictions Precautions Precautions: Knee Restrictions Other Position/Activity Restrictions: WBAT      Mobility  Bed Mobility Overal bed mobility: Needs Assistance Bed Mobility: Supine to Sit     Supine to sit: Min guard     General bed mobility comments: with RLE  Transfers Overall transfer level: Needs assistance Equipment used: Rolling walker (2 wheeled) Transfers: Sit to/from Stand Sit to Stand: Supervision         General transfer comment: cues for hand placement, overall safety  Ambulation/Gait Ambulation/Gait assistance: Supervision Ambulation Distance (Feet): 120 Feet Assistive device: Rolling walker (2 wheeled) Gait Pattern/deviations: Step-to pattern;Step-through pattern     General Gait Details: cues for gait progression  Stairs            Wheelchair Mobility    Modified Rankin (Stroke Patients Only)       Balance                                             Pertinent Vitals/Pain Pain Assessment: 0-10 Pain Location: R kknee Pain Descriptors / Indicators: Aching Pain Intervention(s): Limited activity within patient's tolerance;Monitored during session    Home Living Family/patient expects to be discharged to:: Private residence Living Arrangements: Alone Available  Help at Discharge: Family Type of Home: House Home Access: Stairs to [employer] entry   Entrance Stairs-Number of Steps: none Home Layout: Two level;Able to live on main level with bedroom/bathroom Home Equipment: Cane - single point Additional Comments: pt going to sister's at D/C    Prior Function Level of Independence: Independent               Hand Dominance        Extremity/Trunk Assessment   Upper Extremity Assessment: Overall WFL for tasks assessed           Lower Extremity Assessment: RLE deficits/detail RLE Deficits / Details: hip flexion and knee extension 3/5, AAROM ~5* to 95* AAROM flexion knee; ankle WFL       Communication   Communication: No difficulties  Cognition Arousal/Alertness: Awake/alert Behavior During Therapy: WFL for tasks assessed/performed Overall Cognitive Status: Within Functional Limits for tasks assessed                      General Comments      Exercises Total Joint Exercises Ankle Circles/Pumps: AROM;Both;10 reps Quad Sets: 10 reps;Both;AROM Heel Slides: AAROM;10 reps;AROM Hip ABduction/ADduction: AROM;Both;10 reps Straight Leg Raises: AROM;Both;10 reps   Assessment/Plan    PT Assessment All further PT needs can be met in the next venue of care  PT Problem List            PT Treatment Interventions      PT  Goals (Current goals can be found in the Care Plan section)  Acute Rehab PT Goals Patient Stated Goal: home today PT Goal Formulation: All assessment and education complete, DC therapy    Frequency     Barriers to discharge        Co-evaluation               End of Session Equipment Utilized During Treatment: Gait belt Activity Tolerance: Patient tolerated treatment well Patient left: with call bell/phone within reach;in chair;with chair alarm set           Time: [phone] PT Time Calculation (min) (ACUTE ONLY): 28 min   Charges:   PT Evaluation $PT Eval Low Complexity: 1  Procedure PT Treatments $Gait Training: 8-22 mins   PT G Codes:        Leler Brion 2016-05-10, 11:24 AM

## 2016-05-09 NOTE — Care Management Note (Signed)
Case Management Note  Patient Details  Name: Edward [name] MRN: [id number] Date of Birth: 02-26-1951  Subjective/Objective:                  SCAR DEBRIDEMENT OF TOTAL KNEE, EXCISION OF SAPHENOUS NEUROMA (Right) POLY EXCHANGE (Right)  Action/Plan: Discharge planning Expected Discharge Date:  05/09/16              Expected Discharge Plan:  [place]  In-House Referral:     Discharge planning Services  CM Consult  Post Acute Care Choice:  Home Health Choice offered to:  Patient  DME Arranged:  Walker rolling DME Agency:  [place]:  PT [place] Agency:  [facility]  Status of Service:  Completed, signed off  If discussed at [employer], dates discussed:    Additional Comments: CM met with pt in room to offer choice of home health agency.  Pt chooses [facility] to render HHPT.  Referral caled to [facility] rep, Stanton Kidney.  Cm notified [place] DME rep, Jermaine to please deliver the rolling walker to room prior to discharge.  NO other Cm needs were communicated. Dellie Catholic, RN 05/09/2016, 10:12 AM

## 2016-05-09 NOTE — Progress Notes (Signed)
     Subjective: 1 Day Post-Op Procedure(s) (LRB): SCAR DEBRIDEMENT OF TOTAL KNEE, EXCISION OF SAPHENOUS NEUROMA (Right) POLY EXCHANGE (Right)   Seen with Dr. Alvan Dame. Patient reports pain as mild, pain controlled. No events throughout the night.  Already feels much better than he does prior to surgery.  Ready to be discharged home.  Objective:   VITALS:   Vitals:   05/09/16 0154 05/09/16 0444  BP: 120/71 (!) 105/57  Pulse: 65 (!) 57  Resp: 15 16  Temp: 97.4 F (36.3 C) 97.8 F (36.6 C)    Dorsiflexion/Plantar flexion intact Incision: dressing C/D/I No cellulitis present Compartment soft  LABS  Recent Labs  05/09/16 0428  HGB 12.5*  HCT 36.7*  WBC 11.9*  PLT 148*     Recent Labs  05/09/16 0428  NA 139  K 4.1  BUN 12  CREATININE 0.71  GLUCOSE 160*     Assessment/Plan: 1 Day Post-Op Procedure(s) (LRB): SCAR DEBRIDEMENT OF TOTAL KNEE, EXCISION OF SAPHENOUS NEUROMA (Right) POLY EXCHANGE (Right) Foley cath d/c'ed Advance diet Up with therapy D/C IV fluids Discharge home with home health Follow up [facility] 2 [facility] at [facility]. Follow up with OLIN,Breyer Tejera D in 2 weeks.  Contact information:  [facility] [address] [place] [id number]    Overweight (BMI 25-29.9) Estimated body mass index is 28.38 kg/m as calculated from the following:   Height as of this encounter: 5' 9.5" (1.765 m).   Weight as of this encounter: 88.5 kg (195 lb). Patient also counseled that weight may inhibit the healing process Patient counseled that losing weight will help with future health issues      West Pugh. Josphine Laffey   PAC  05/09/2016, 7:37 AM

## 2016-05-09 NOTE — Progress Notes (Signed)
OT Cancellation Note  Patient Details Name: Edward [name] MRN: [id number] DOB: [birth year]   Cancelled Treatment:    Reason Eval/Treat Not Completed: PT screened, no needs identified, will sign off  Taline Nass 05/09/2016, 10:26 AM  Lesle Chris, OTR/L [phone] 05/09/2016

## 2016-05-17 NOTE — Discharge Summary (Signed)
Physician Discharge Summary  Patient ID: Edward [name] MRN: [id number] DOB/AGE: 1951/01/15 65 y.o.  Admit date: 05/08/2016 Discharge date: 05/09/2016   Procedures:  Procedure(s) (LRB): SCAR DEBRIDEMENT OF TOTAL KNEE, EXCISION OF SAPHENOUS NEUROMA (Right) POLY EXCHANGE (Right)  Attending Physician:  Dr. Paralee Cancel   Admission Diagnoses:   Painful right TKA saphenous nerve  Discharge Diagnoses:  Active Problems:   S/P revision of total knee, right  Past Medical History:  Diagnosis Date  . Anginal pain ([place])    last NTG 3-4 months ago  . Anxiety    claustrophobic  . Arthritis    throughout arms, knees & back & neck  . CAD (coronary artery disease)    with high grade obstruction second obtuse marginal and diffuse LAD and circumflex disease by cath 2010  . Cancer ([place])    skin cancer, on R ear - basal cell   . Cold sore    right upper lip, healing well  . Colon polyp   . Elevated cholesterol    LDL Goal < 70  . Erectile dysfunction   . GERD (gastroesophageal reflux disease)    has not had problem several months  . History of nonmelanoma skin cancer   . History of nuclear stress test 5/10   no ischemia  . Hypertension     HPI:     Pt is a 65 y.o. male complaining of right knee pain for 1+ years. Pain had continually increased since the beginning. X-rays in the clinic show previous right TKA. Pt has tried various conservative treatments which have failed to alleviate their symptoms, including analgesic medications, PT, assistance device and activity modification. Various options are discussed with the patient. Risks, benefits and expectations were discussed with the patient. Patient understand the risks, benefits and expectations and wishes to proceed with surgery.  PCP: Mathews Argyle, MD   Discharged Condition: good  Hospital Course:  Patient underwent the above stated procedure on 05/08/2016. Patient tolerated the procedure well and brought to the recovery  room in good condition and subsequently to the floor.  POD #1 BP: 105/57 ; Pulse: 57 ; Temp: 97.8 F (36.6 C) ; Resp: 16 Patient reports pain as mild, pain controlled. No events throughout the night.  Already feels much better than he does prior to surgery.  Ready to be discharged home. Dorsiflexion/plantar flexion intact, incision: dressing C/D/I, no cellulitis present and compartment soft.   LABS  Basename    HGB     12.5  HCT     36.7    Discharge Exam: General appearance: alert, cooperative and no distress Extremities: Homans sign is negative, no sign of DVT, no edema, redness or tenderness in the calves or thighs and no ulcers, gangrene or trophic changes  Disposition: Home with follow up in 2 weeks   Follow-up Information    Mauri Pole, MD. Schedule an appointment as soon as possible for a visit in 2 week(s).   Specialty:  Orthopedic Surgery Contact information: [address] [place] [address] [phone]        Inc[facility] - Dme [facility] .   Why:  rolling walker Contact information: [address] [place] [address] [phone]        [place] .   Specialty:  [place] Why:  your home health physical therapy Contact information: [place] [address] [phone]  Discharge Instructions    Call MD / Call 911    Complete by:  As directed    If you experience chest pain or shortness of breath, CALL 911 and be transported to the hospital emergency room.  If you develope a fever above 101 F, pus (white drainage) or increased drainage or redness at the wound, or calf pain, call your surgeon's office.   Change dressing    Complete by:  As directed    Maintain surgical dressing until follow up in the clinic. If the edges start to pull up, may reinforce with tape. If the dressing is no longer working, may remove and cover with gauze and tape, but must keep the area dry  and clean.  Call with any questions or concerns.   Constipation Prevention    Complete by:  As directed    Drink plenty of fluids.  Prune juice may be helpful.  You may use a stool softener, such as Colace (over the counter) 100 mg twice a day.  Use MiraLax (over the counter) for constipation as needed.   Diet - low sodium heart healthy    Complete by:  As directed    Discharge instructions    Complete by:  As directed    Maintain surgical dressing until follow up in the clinic. If the edges start to pull up, may reinforce with tape. If the dressing is no longer working, may remove and cover with gauze and tape, but must keep the area dry and clean.  Follow up in 2 weeks at [facility]. Call with any questions or concerns.   Increase activity slowly as tolerated    Complete by:  As directed    Weight bearing as tolerated with assist device (walker, cane, etc) as directed, use it as long as suggested by your surgeon or therapist, typically at least 4-6 weeks.   TED hose    Complete by:  As directed    Use stockings (TED hose) for 2 weeks on both leg(s).  You may remove them at night for sleeping.        Medication List    STOP taking these medications   aspirin EC 325 MG tablet Replaced by:  aspirin 81 MG chewable tablet   HYDROcodone-acetaminophen 5-325 MG tablet Commonly known as:  NORCO/VICODIN Replaced by:  HYDROcodone-acetaminophen 7.5-325 MG tablet     TAKE these medications   ALPRAZolam 0.5 MG tablet Commonly known as:  XANAX Take 0.5 mg by mouth at bedtime as needed for anxiety.   aspirin 81 MG chewable tablet Chew 1 tablet (81 mg total) by mouth 2 (two) times daily. Take for 4 weeks, then resume regular dose. Replaces:  aspirin EC 325 MG tablet   docusate sodium 100 MG capsule Commonly known as:  COLACE Take 1 capsule (100 mg total) by mouth 2 (two) times daily.   ferrous sulfate 325 (65 FE) MG tablet Take 1 tablet (325 mg total) by mouth 3 (three)  times daily after meals.   HYDROcodone-acetaminophen 7.5-325 MG tablet Commonly known as:  NORCO Take 1-2 tablets by mouth every 4 (four) hours as needed for moderate pain. Replaces:  HYDROcodone-acetaminophen 5-325 MG tablet   losartan 100 MG tablet Commonly known as:  COZAAR Take 50 mg by mouth every evening.   methocarbamol 500 MG tablet Commonly known as:  ROBAXIN Take 1 tablet (500 mg total) by mouth every 6 (six) hours as needed for muscle spasms.   nitroGLYCERIN 0.4 MG SL tablet  Commonly known as:  NITROSTAT Place 1 tablet (0.4 mg total) under the tongue every 5 (five) minutes as needed for chest pain.   pantoprazole 40 MG tablet Commonly known as:  PROTONIX Take 40 mg by mouth daily as needed (for acid reflux).   polyethylene glycol packet Commonly known as:  MIRALAX / GLYCOLAX Take 17 g by mouth 2 (two) times daily.   rosuvastatin 20 MG tablet Commonly known as:  CRESTOR Take 1 tablet (20 mg total) by mouth daily. What changed:  when to take this   tamsulosin 0.4 MG Caps capsule Commonly known as:  FLOMAX Take 1 capsule by mouth daily.        Signed: West Pugh. Julieann Drummonds   PA-C  05/17/2016, 9:34 AM

## 2016-12-21 ENCOUNTER — Other Ambulatory Visit: Payer: Self-pay | Admitting: Cardiology

## 2016-12-22 NOTE — Telephone Encounter (Signed)
Ok to refill for 6 more months.  He is suppose to f/u in Sept. But f/u says based on Myoview.  If he doesn't then will defer to PCP.

## 2016-12-22 NOTE — Telephone Encounter (Signed)
Okay to refill or should this be deferred to Dr Felipa Eth? Per last office visit: Hyperlipidemia- Dr. Felipa Eth follows - we did refill crestor 90 days at a time Please advise. Thanks, MI

## 2016-12-25 ENCOUNTER — Other Ambulatory Visit: Payer: Self-pay | Admitting: Neurosurgery

## 2016-12-27 ENCOUNTER — Telehealth: Payer: Self-pay | Admitting: Interventional Cardiology

## 2016-12-27 NOTE — Telephone Encounter (Signed)
New Message:   Pt wants to know if it is alright for him to take Diclofenac Sodium 1% gel.This was prescribed by his Orthopedic,concerned about what he read about the side effects.

## 2016-12-27 NOTE — Telephone Encounter (Signed)
Spoke with Jinny Blossom, [facility] and she said ok to use for a short period of time.  Spoke with pt and made him aware of that.  Pt verbalized understanding and was in agreement with this plan.

## 2017-01-01 NOTE — Progress Notes (Addendum)
PCP: Dr.  Felipa Eth  Cardiologist: Dr. Daneen Schick  EKG: pt denies past year  Stress test: 04/2016  ECHO: pt denies  Cardiac Cath: 2004 and 2010  Chest x-ray: pt denies past year

## 2017-01-01 NOTE — Pre-Procedure Instructions (Signed)
Edward [name]  01/01/2017      [id number] [place], [place] [address] [facility] [place] [address] [place] [address] Phone: [phone] Fax: [phone]  [place], [place] [facility] [address] [place] [address] [place] [address] Phone: [phone] Fax: [phone]   Your procedure is scheduled on January 04, 2017. Report to [facility] Admitting at 530 AM. Call this number if you have problems the morning of surgery:[phone]   Remember:  Do not eat food or drink liquids after midnight.  Take these medicines the morning of surgery with A SIP OF WATER hydrocodone-acetaminophen (norco)-if needed for pain, pantoprazole (protonix), tamsulosin (flomax).  7 days prior to surgery STOP taking any Aspirin, Aleve, Naproxen, Ibuprofen, Motrin, Advil, Goody's, BC's, all herbal medications, fish oil, and all vitamins   Do not wear jewelry, make-up or nail polish.  Do not wear lotions, powders, or perfumes, or deoderant.  Men may shave face and neck.  Do not bring valuables to the hospital.  [facility] is not responsible for any belongings or valuables.  Contacts, dentures or bridgework may not be worn into surgery.  Leave your suitcase in the car.  After surgery it may be brought to your room.  For patients admitted to the hospital, discharge time will be determined by your treatment team.  Patients discharged the day of surgery will not be allowed to drive home.    Special instructions:   [facility]- Preparing For Surgery  Before surgery, you can play an important role. Because skin is not sterile, your skin needs to be as free of germs as possible. You can reduce the number of germs on your skin by washing with CHG (chlorahexidine gluconate) Soap before surgery.  CHG is an antiseptic cleaner which kills germs and bonds with the skin to continue killing germs even after washing.  Please  do not use if you have an allergy to CHG or antibacterial soaps. If your skin becomes reddened/irritated stop using the CHG.  Do not shave (including legs and underarms) for at least 48 hours prior to first CHG shower. It is OK to shave your face.  Please follow these instructions carefully.   1. Shower the NIGHT BEFORE SURGERY and the MORNING OF SURGERY with CHG.   2. If you chose to wash your hair, wash your hair first as usual with your normal shampoo.  3. After you shampoo, rinse your hair and body thoroughly to remove the shampoo.  4. Use CHG as you would any other liquid soap. You can apply CHG directly to the skin and wash gently with a scrungie or a clean washcloth.   5. Apply the CHG Soap to your body ONLY FROM THE NECK DOWN.  Do not use on open wounds or open sores. Avoid contact with your eyes, ears, mouth and genitals (private parts). Wash genitals (private parts) with your normal soap.  6. Wash thoroughly, paying special attention to the area where your surgery will be performed.  7. Thoroughly rinse your body with warm water from the neck down.  8. DO NOT shower/wash with your normal soap after using and rinsing off the CHG Soap.  9. Pat yourself dry with a CLEAN TOWEL.   10. Wear CLEAN PAJAMAS   11. Place CLEAN SHEETS on your bed the night of your first shower and DO NOT SLEEP WITH PETS.    Day  of Surgery: Do not apply any deodorants/lotions. Please wear clean clothes to the hospital/surgery center.     Please read over the following fact sheets that you were given. Pain Booklet, Coughing and Deep Breathing, MRSA Information and Surgical Site Infection Prevention

## 2017-01-02 ENCOUNTER — Encounter (HOSPITAL_COMMUNITY): Payer: Self-pay

## 2017-01-02 ENCOUNTER — Encounter (HOSPITAL_COMMUNITY)
Admission: RE | Admit: 2017-01-02 | Discharge: 2017-01-02 | Disposition: A | Discharge status: Home / Self Care | Payer: Medicare Other | Source: Ambulatory Visit | Attending: Neurosurgery | Admitting: Neurosurgery

## 2017-01-02 ENCOUNTER — Other Ambulatory Visit: Payer: Self-pay

## 2017-01-02 DIAGNOSIS — Z01812 Encounter for preprocedural laboratory examination: Secondary | ICD-10-CM

## 2017-01-02 LAB — BASIC METABOLIC PANEL
Anion gap: 8 (ref 5–15)
BUN: 8 mg/dL (ref 6–20)
CHLORIDE: 107 mmol/L (ref 101–111)
CO2: 23 mmol/L (ref 22–32)
CREATININE: 0.77 mg/dL (ref 0.61–1.24)
Calcium: 9.1 mg/dL (ref 8.9–10.3)
GFR calc non Af Amer: >60 mL/min (ref >60)
GLUCOSE: 97 mg/dL (ref 65–99)
Potassium: 3.8 mmol/L (ref 3.5–5.1)
Sodium: 138 mmol/L (ref 135–145)

## 2017-01-02 LAB — CBC
HEMATOCRIT: 42.9 % (ref 39.0–52.0)
HEMOGLOBIN: 14.8 g/dL (ref 13.0–17.0)
MCH: 30.1 pg (ref 26.0–34.0)
MCHC: 34.5 g/dL (ref 30.0–36.0)
MCV: 87.2 fL (ref 78.0–100.0)
Platelets: 135 10*3/uL — ABNORMAL LOW (ref 150–400)
RBC: 4.92 MIL/uL (ref 4.22–5.81)
RDW: 12.6 % (ref 11.5–15.5)
WBC: 5 10*3/uL (ref 4.0–10.5)

## 2017-01-02 LAB — ABO/RH: ABO/RH(D): O POS

## 2017-01-02 LAB — TYPE AND SCREEN
ABO/RH(D): O POS
ANTIBODY SCREEN: NEGATIVE

## 2017-01-02 LAB — SURGICAL PCR SCREEN
MRSA, PCR: NEGATIVE
Staphylococcus aureus: NEGATIVE

## 2017-01-02 MED ORDER — CHLORHEXIDINE GLUCONATE CLOTH 2 % EX PADS: 6.0000 | MEDICATED_PAD | Freq: Once | CUTANEOUS | Status: DC

## 2017-01-03 NOTE — Anesthesia Preprocedure Evaluation (Addendum)
Anesthesia Evaluation  Patient identified by MRN, date of birth, ID band Patient awake    Reviewed: Allergy & Precautions, H&P , NPO status , Patient's Chart, lab work & pertinent test results  Airway Mallampati: II  TM Distance: >3 FB Neck ROM: Full    Dental no notable dental hx. (+) Teeth Intact, Dental Advisory Given   Pulmonary neg pulmonary ROS, former smoker,    Pulmonary exam normal breath sounds clear to auscultation       Cardiovascular Exercise Tolerance: Good hypertension, Pt. on medications + CAD   Rhythm:Regular Rate:Normal     Neuro/Psych Anxiety negative neurological ROS  negative psych ROS   GI/Hepatic Neg liver ROS, GERD  Medicated and Controlled,  Endo/Other  negative endocrine ROS  Renal/GU negative Renal ROS  negative genitourinary   Musculoskeletal  (+) Arthritis , Osteoarthritis,    Abdominal   Peds  Hematology negative hematology ROS (+)   Anesthesia Other Findings   Reproductive/Obstetrics negative OB ROS                            Anesthesia Physical Anesthesia Plan  ASA: III  Anesthesia Plan: General   Post-op Pain Management:    Induction: Intravenous  PONV Risk Score and Plan: 3 and Ondansetron, Dexamethasone, Propofol and Midazolam  Airway Management Planned: Oral ETT  Additional Equipment:   Intra-op Plan:   Post-operative Plan: Extubation in OR  Informed Consent: I have reviewed the patients History and Physical, chart, labs and discussed the procedure including the risks, benefits and alternatives for the proposed anesthesia with the patient or authorized representative who has indicated his/her understanding and acceptance.   Dental advisory given  Plan Discussed with: CRNA  Anesthesia Plan Comments:       Anesthesia Quick Evaluation

## 2017-01-04 ENCOUNTER — Inpatient Hospital Stay (HOSPITAL_COMMUNITY): Payer: Medicare Other

## 2017-01-04 ENCOUNTER — Encounter (HOSPITAL_COMMUNITY)
Admission: RE | Disposition: A | Discharge status: Home / Self Care | Payer: Self-pay | Source: Ambulatory Visit | Attending: Neurosurgery

## 2017-01-04 ENCOUNTER — Inpatient Hospital Stay (HOSPITAL_COMMUNITY): Payer: Medicare Other | Admitting: Anesthesiology

## 2017-01-04 ENCOUNTER — Encounter (HOSPITAL_COMMUNITY): Payer: Self-pay

## 2017-01-04 ENCOUNTER — Inpatient Hospital Stay (HOSPITAL_COMMUNITY)
Admission: RE | Admit: 2017-01-04 | Discharge: 2017-01-05 | DRG: 455 | Disposition: A | Discharge status: Home / Self Care | Payer: Medicare Other | Source: Ambulatory Visit | Attending: Neurosurgery | Admitting: Neurosurgery

## 2017-01-04 DIAGNOSIS — Z79899 Other long term (current) drug therapy: Secondary | ICD-10-CM

## 2017-01-04 DIAGNOSIS — M4126 Other idiopathic scoliosis, lumbar region: Secondary | ICD-10-CM | POA: Diagnosis present

## 2017-01-04 DIAGNOSIS — Z7982 Long term (current) use of aspirin: Secondary | ICD-10-CM

## 2017-01-04 DIAGNOSIS — M5116 Intervertebral disc disorders with radiculopathy, lumbar region: Secondary | ICD-10-CM | POA: Diagnosis present

## 2017-01-04 DIAGNOSIS — Z87891 Personal history of nicotine dependence: Secondary | ICD-10-CM | POA: Diagnosis not present

## 2017-01-04 DIAGNOSIS — I2510 Atherosclerotic heart disease of native coronary artery without angina pectoris: Secondary | ICD-10-CM | POA: Diagnosis present

## 2017-01-04 DIAGNOSIS — F419 Anxiety disorder, unspecified: Secondary | ICD-10-CM | POA: Diagnosis present

## 2017-01-04 DIAGNOSIS — M5127 Other intervertebral disc displacement, lumbosacral region: Secondary | ICD-10-CM | POA: Diagnosis present

## 2017-01-04 DIAGNOSIS — I10 Essential (primary) hypertension: Secondary | ICD-10-CM | POA: Diagnosis present

## 2017-01-04 DIAGNOSIS — Z96659 Presence of unspecified artificial knee joint: Secondary | ICD-10-CM | POA: Diagnosis present

## 2017-01-04 DIAGNOSIS — M4727 Other spondylosis with radiculopathy, lumbosacral region: Secondary | ICD-10-CM | POA: Diagnosis present

## 2017-01-04 DIAGNOSIS — M1990 Unspecified osteoarthritis, unspecified site: Secondary | ICD-10-CM | POA: Diagnosis present

## 2017-01-04 DIAGNOSIS — M4726 Other spondylosis with radiculopathy, lumbar region: Secondary | ICD-10-CM | POA: Diagnosis present

## 2017-01-04 DIAGNOSIS — Z419 Encounter for procedure for purposes other than remedying health state, unspecified: Secondary | ICD-10-CM

## 2017-01-04 DIAGNOSIS — K219 Gastro-esophageal reflux disease without esophagitis: Secondary | ICD-10-CM | POA: Diagnosis present

## 2017-01-04 DIAGNOSIS — R110 Nausea: Secondary | ICD-10-CM | POA: Diagnosis not present

## 2017-01-04 HISTORY — PX: MAXIMUM ACCESS (MAS)POSTERIOR LUMBAR INTERBODY FUSION (PLIF) 2 LEVEL: SHX6369

## 2017-01-04 SURGERY — FOR MAXIMUM ACCESS (MAS) POSTERIOR LUMBAR INTERBODY FUSION (PLIF) 2 LEVEL
Anesthesia: General | Site: Back

## 2017-01-04 MED ORDER — ROCURONIUM BROMIDE 10 MG/ML (PF) SYRINGE
PREFILLED_SYRINGE | INTRAVENOUS | Status: AC
  Filled 2017-01-04: qty 5

## 2017-01-04 MED ORDER — FLEET ENEMA 7-19 GM/118ML RE ENEM
1.0000 | ENEMA | Freq: Once | RECTAL | Status: DC | PRN
Start: 2017-01-04 — End: 2017-01-05

## 2017-01-04 MED ORDER — ACETAMINOPHEN 650 MG RE SUPP: 650.0000 mg | RECTAL | Status: DC | PRN

## 2017-01-04 MED ORDER — ROSUVASTATIN CALCIUM 20 MG PO TABS
20.0000 mg | ORAL_TABLET | Freq: Every evening | ORAL | Status: DC
  Administered 2017-01-04: 20 mg via ORAL
  Filled 2017-01-04: qty 1

## 2017-01-04 MED ORDER — CEFAZOLIN SODIUM-DEXTROSE 2-4 GM/100ML-% IV SOLN
INTRAVENOUS | Status: AC
  Filled 2017-01-04: qty 100

## 2017-01-04 MED ORDER — KCL IN DEXTROSE-NACL 20-5-0.45 MEQ/L-%-% IV SOLN
INTRAVENOUS | Status: AC
  Filled 2017-01-04: qty 1000

## 2017-01-04 MED ORDER — SODIUM CHLORIDE 0.9% FLUSH: 3.0000 mL | INTRAVENOUS | Status: DC | PRN

## 2017-01-04 MED ORDER — PANTOPRAZOLE SODIUM 40 MG PO TBEC
40.0000 mg | DELAYED_RELEASE_TABLET | Freq: Every day | ORAL | Status: DC | PRN
  Administered 2017-01-04 – 2017-01-05 (×2): 40 mg via ORAL
  Filled 2017-01-04 (×3): qty 1

## 2017-01-04 MED ORDER — SUCCINYLCHOLINE CHLORIDE 20 MG/ML IJ SOLN: INTRAMUSCULAR | Status: DC | PRN

## 2017-01-04 MED ORDER — DEXAMETHASONE SODIUM PHOSPHATE 10 MG/ML IJ SOLN
INTRAMUSCULAR | Status: DC | PRN
  Administered 2017-01-04: 10 mg via INTRAVENOUS

## 2017-01-04 MED ORDER — ALPRAZOLAM 0.5 MG PO TABS
0.5000 mg | ORAL_TABLET | Freq: Every evening | ORAL | Status: DC | PRN
  Administered 2017-01-04: 0.5 mg via ORAL
  Filled 2017-01-04: qty 1

## 2017-01-04 MED ORDER — MIDAZOLAM HCL 5 MG/5ML IJ SOLN
INTRAMUSCULAR | Status: DC | PRN
Start: 2017-01-04 — End: 2017-01-04
  Administered 2017-01-04: 2 mg via INTRAVENOUS

## 2017-01-04 MED ORDER — BUPIVACAINE LIPOSOME 1.3 % IJ SUSP
20.0000 mL | INTRAMUSCULAR | Status: AC
  Administered 2017-01-04: 20 mL
  Filled 2017-01-04: qty 20

## 2017-01-04 MED ORDER — LACTATED RINGERS IV SOLN
INTRAVENOUS | Status: DC | PRN
  Administered 2017-01-04: 07:00:00 via INTRAVENOUS

## 2017-01-04 MED ORDER — METHOCARBAMOL 500 MG PO TABS
500.0000 mg | ORAL_TABLET | Freq: Four times a day (QID) | ORAL | Status: DC | PRN
  Administered 2017-01-04 – 2017-01-05 (×4): 500 mg via ORAL
  Filled 2017-01-04 (×3): qty 1

## 2017-01-04 MED ORDER — SURGIFOAM 100 EX MISC
CUTANEOUS | Status: DC | PRN
  Administered 2017-01-04: 09:00:00 via TOPICAL

## 2017-01-04 MED ORDER — TAMSULOSIN HCL 0.4 MG PO CAPS
0.4000 mg | ORAL_CAPSULE | Freq: Every day | ORAL | Status: DC
  Administered 2017-01-04 – 2017-01-05 (×2): 0.4 mg via ORAL
  Filled 2017-01-04 (×2): qty 1

## 2017-01-04 MED ORDER — PROPOFOL 500 MG/50ML IV EMUL
INTRAVENOUS | Status: DC | PRN
  Administered 2017-01-04: 11:00:00 via INTRAVENOUS
  Administered 2017-01-04: 50 ug/kg/min via INTRAVENOUS

## 2017-01-04 MED ORDER — ACETAMINOPHEN 325 MG PO TABS: 650.0000 mg | ORAL_TABLET | ORAL | Status: DC | PRN

## 2017-01-04 MED ORDER — MENTHOL 3 MG MT LOZG: 1.0000 | LOZENGE | OROMUCOSAL | Status: DC | PRN

## 2017-01-04 MED ORDER — ATROPINE SULFATE 0.4 MG/ML IJ SOLN
INTRAMUSCULAR | Status: DC | PRN
  Administered 2017-01-04: 1.2 mg via INTRAVENOUS

## 2017-01-04 MED ORDER — ONDANSETRON HCL 4 MG PO TABS: 4.0000 mg | ORAL_TABLET | Freq: Four times a day (QID) | ORAL | Status: DC | PRN

## 2017-01-04 MED ORDER — CEFAZOLIN SODIUM-DEXTROSE 2-4 GM/100ML-% IV SOLN
2.0000 g | INTRAVENOUS | Status: AC
  Administered 2017-01-04: 2 g via INTRAVENOUS

## 2017-01-04 MED ORDER — HYDROCODONE-ACETAMINOPHEN 10-325 MG PO TABS
1.0000 | ORAL_TABLET | ORAL | Status: DC | PRN
  Administered 2017-01-04: 2 via ORAL
  Administered 2017-01-04 – 2017-01-05 (×2): 1 via ORAL
  Administered 2017-01-05: 2 via ORAL
  Administered 2017-01-05: 1 via ORAL
  Administered 2017-01-05 (×2): 2 via ORAL
  Filled 2017-01-04: qty 2
  Filled 2017-01-04: qty 1
  Filled 2017-01-04: qty 2
  Filled 2017-01-04: qty 1
  Filled 2017-01-04 (×2): qty 2
  Filled 2017-01-04: qty 1

## 2017-01-04 MED ORDER — SODIUM CHLORIDE 0.9 % IV SOLN
INTRAVENOUS | Status: DC | PRN
  Administered 2017-01-04: 12:00:00 via INTRAVENOUS

## 2017-01-04 MED ORDER — POLYETHYLENE GLYCOL 3350 17 G PO PACK: 17.0000 g | PACK | Freq: Every day | ORAL | Status: DC | PRN

## 2017-01-04 MED ORDER — PROPOFOL 10 MG/ML IV BOLUS
INTRAVENOUS | Status: AC
  Filled 2017-01-04: qty 20

## 2017-01-04 MED ORDER — THROMBIN 5000 UNITS EX SOLR
CUTANEOUS | Status: AC
  Filled 2017-01-04: qty 5000

## 2017-01-04 MED ORDER — DOCUSATE SODIUM 100 MG PO CAPS
100.0000 mg | ORAL_CAPSULE | Freq: Two times a day (BID) | ORAL | Status: DC
  Administered 2017-01-04 – 2017-01-05 (×2): 100 mg via ORAL
  Filled 2017-01-04 (×2): qty 1

## 2017-01-04 MED ORDER — LOSARTAN POTASSIUM 50 MG PO TABS
50.0000 mg | ORAL_TABLET | Freq: Every evening | ORAL | Status: DC
  Administered 2017-01-04: 50 mg via ORAL
  Filled 2017-01-04: qty 1

## 2017-01-04 MED ORDER — FENTANYL CITRATE (PF) 100 MCG/2ML IJ SOLN
INTRAMUSCULAR | Status: DC | PRN
  Administered 2017-01-04 (×2): 50 ug via INTRAVENOUS
  Administered 2017-01-04: 100 ug via INTRAVENOUS
  Administered 2017-01-04: 50 ug via INTRAVENOUS

## 2017-01-04 MED ORDER — BISACODYL 10 MG RE SUPP
10.0000 mg | Freq: Every day | RECTAL | Status: DC | PRN
Start: 2017-01-04 — End: 2017-01-05

## 2017-01-04 MED ORDER — PHENYLEPHRINE HCL 10 MG/ML IJ SOLN
INTRAVENOUS | Status: DC | PRN
  Administered 2017-01-04: 40 ug/min via INTRAVENOUS

## 2017-01-04 MED ORDER — ONDANSETRON HCL 4 MG/2ML IJ SOLN
INTRAMUSCULAR | Status: AC
  Filled 2017-01-04: qty 2

## 2017-01-04 MED ORDER — METHOCARBAMOL 500 MG PO TABS
ORAL_TABLET | ORAL | Status: AC
  Filled 2017-01-04: qty 1

## 2017-01-04 MED ORDER — LIDOCAINE 2% (20 MG/ML) 5 ML SYRINGE
INTRAMUSCULAR | Status: AC
  Filled 2017-01-04: qty 5

## 2017-01-04 MED ORDER — MORPHINE SULFATE (PF) 4 MG/ML IV SOLN
2.0000 mg | INTRAVENOUS | Status: DC | PRN
  Administered 2017-01-04 (×2): 2 mg via INTRAVENOUS
  Filled 2017-01-04 (×2): qty 1

## 2017-01-04 MED ORDER — POLYETHYLENE GLYCOL 3350 17 G PO PACK
17.0000 g | PACK | Freq: Two times a day (BID) | ORAL | Status: DC
Start: 2017-01-04 — End: 2017-01-04

## 2017-01-04 MED ORDER — SUCCINYLCHOLINE 20MG/ML (10ML) SYRINGE FOR MEDFUSION PUMP - OPTIME: INTRAMUSCULAR | Status: DC | PRN

## 2017-01-04 MED ORDER — CEFAZOLIN SODIUM-DEXTROSE 2-4 GM/100ML-% IV SOLN
2.0000 g | Freq: Three times a day (TID) | INTRAVENOUS | Status: AC
  Administered 2017-01-04 (×2): 2 g via INTRAVENOUS
  Filled 2017-01-04: qty 100

## 2017-01-04 MED ORDER — HYDROMORPHONE HCL 1 MG/ML IJ SOLN
INTRAMUSCULAR | Status: AC
  Filled 2017-01-04: qty 0.5

## 2017-01-04 MED ORDER — NITROGLYCERIN 0.4 MG SL SUBL: 0.4000 mg | SUBLINGUAL_TABLET | SUBLINGUAL | Status: DC | PRN

## 2017-01-04 MED ORDER — THROMBIN 20000 UNITS EX SOLR
CUTANEOUS | Status: AC
  Filled 2017-01-04: qty 20000

## 2017-01-04 MED ORDER — LIDOCAINE-EPINEPHRINE 1 %-1:100000 IJ SOLN
INTRAMUSCULAR | Status: DC | PRN
  Administered 2017-01-04: 5 mL

## 2017-01-04 MED ORDER — LACTATED RINGERS IV SOLN
INTRAVENOUS | Status: DC | PRN
  Administered 2017-01-04: 08:00:00 via INTRAVENOUS

## 2017-01-04 MED ORDER — KCL IN DEXTROSE-NACL 20-5-0.45 MEQ/L-%-% IV SOLN
INTRAVENOUS | Status: DC
  Administered 2017-01-04: 12:00:00 via INTRAVENOUS

## 2017-01-04 MED ORDER — PANTOPRAZOLE SODIUM 40 MG IV SOLR: 40.0000 mg | Freq: Every day | INTRAVENOUS | Status: DC

## 2017-01-04 MED ORDER — BUPIVACAINE HCL (PF) 0.5 % IJ SOLN
INTRAMUSCULAR | Status: AC
Start: 2017-01-04 — End: ?
  Filled 2017-01-04: qty 30

## 2017-01-04 MED ORDER — ONDANSETRON HCL 4 MG/2ML IJ SOLN
INTRAMUSCULAR | Status: DC | PRN
  Administered 2017-01-04: 4 mg via INTRAVENOUS

## 2017-01-04 MED ORDER — PROPOFOL 10 MG/ML IV BOLUS
INTRAVENOUS | Status: DC | PRN
  Administered 2017-01-04: 100 mg via INTRAVENOUS
  Administered 2017-01-04: 50 mg via INTRAVENOUS

## 2017-01-04 MED ORDER — MIDAZOLAM HCL 2 MG/2ML IJ SOLN
INTRAMUSCULAR | Status: AC
  Filled 2017-01-04: qty 2

## 2017-01-04 MED ORDER — METHOCARBAMOL 1000 MG/10ML IJ SOLN: 500.0000 mg | Freq: Four times a day (QID) | INTRAVENOUS | Status: DC | PRN

## 2017-01-04 MED ORDER — LIDOCAINE HCL (CARDIAC) 20 MG/ML IV SOLN
INTRAVENOUS | Status: DC | PRN
  Administered 2017-01-04: 60 mg via INTRAVENOUS

## 2017-01-04 MED ORDER — ZOLPIDEM TARTRATE 5 MG PO TABS: 5.0000 mg | ORAL_TABLET | Freq: Every evening | ORAL | Status: DC | PRN

## 2017-01-04 MED ORDER — HYDROMORPHONE HCL 1 MG/ML IJ SOLN
0.2500 mg | INTRAMUSCULAR | Status: DC | PRN
  Administered 2017-01-04 (×2): 0.25 mg via INTRAVENOUS

## 2017-01-04 MED ORDER — 0.9 % SODIUM CHLORIDE (POUR BTL) OPTIME
TOPICAL | Status: DC | PRN
  Administered 2017-01-04: 1000 mL

## 2017-01-04 MED ORDER — THROMBIN 5000 UNITS EX SOLR
OROMUCOSAL | Status: DC | PRN
  Administered 2017-01-04 (×2): via TOPICAL

## 2017-01-04 MED ORDER — PHENOL 1.4 % MT LIQD: 1.0000 | OROMUCOSAL | Status: DC | PRN

## 2017-01-04 MED ORDER — HYDROCODONE-ACETAMINOPHEN 5-325 MG PO TABS: 1.0000 | ORAL_TABLET | ORAL | Status: DC | PRN

## 2017-01-04 MED ORDER — SODIUM CHLORIDE 0.9% FLUSH
3.0000 mL | Freq: Two times a day (BID) | INTRAVENOUS | Status: DC
  Administered 2017-01-04 (×2): 3 mL via INTRAVENOUS

## 2017-01-04 MED ORDER — ONDANSETRON HCL 4 MG/2ML IJ SOLN: 4.0000 mg | Freq: Four times a day (QID) | INTRAMUSCULAR | Status: DC | PRN

## 2017-01-04 MED ORDER — FENTANYL CITRATE (PF) 250 MCG/5ML IJ SOLN
INTRAMUSCULAR | Status: AC
  Filled 2017-01-04: qty 5

## 2017-01-04 MED ORDER — ALUM & MAG HYDROXIDE-SIMETH 200-200-20 MG/5ML PO SUSP: 30.0000 mL | Freq: Four times a day (QID) | ORAL | Status: DC | PRN

## 2017-01-04 MED ORDER — PREGABALIN 75 MG PO CAPS
75.0000 mg | ORAL_CAPSULE | Freq: Every day | ORAL | Status: DC
  Administered 2017-01-04: 75 mg via ORAL
  Filled 2017-01-04: qty 1

## 2017-01-04 MED ORDER — BUPIVACAINE HCL (PF) 0.5 % IJ SOLN
INTRAMUSCULAR | Status: DC | PRN
  Administered 2017-01-04: 5 mL

## 2017-01-04 MED ORDER — SUCCINYLCHOLINE CHLORIDE 20 MG/ML IJ SOLN
INTRAMUSCULAR | Status: DC | PRN
  Administered 2017-01-04: 100 mg via INTRAVENOUS

## 2017-01-04 MED ORDER — DOCUSATE SODIUM 100 MG PO CAPS: 100.0000 mg | ORAL_CAPSULE | Freq: Two times a day (BID) | ORAL | Status: DC

## 2017-01-04 MED ORDER — LIDOCAINE-EPINEPHRINE 1 %-1:100000 IJ SOLN
INTRAMUSCULAR | Status: AC
  Filled 2017-01-04: qty 1

## 2017-01-04 MED FILL — Heparin Sodium (Porcine) Inj 1000 Unit/ML: INTRAMUSCULAR | Qty: 30 | Status: AC

## 2017-01-04 MED FILL — Sodium Chloride IV Soln 0.9%: INTRAVENOUS | Qty: 3000 | Status: AC

## 2017-01-04 SURGICAL SUPPLY — 94 items
ADH SKN CLS APL DERMABOND .7 (GAUZE/BANDAGES/DRESSINGS) ×1
BASKET BONE COLLECTION (BASKET) ×3 IMPLANT
BLADE CLIPPER SURG (BLADE) ×2 IMPLANT
BONE CANC CHIPS 20CC PCAN1/4 (Bone Implant) ×3 IMPLANT
BUR MATCHSTICK NEURO 3.0 LAGG (BURR) ×3 IMPLANT
BUR PRECISION FLUTE 5.0 (BURR) IMPLANT
BUR ROUND FLUTED 5 RND (BURR) ×2 IMPLANT
BUR ROUND FLUTED 5MM RND (BURR) ×1
CAGE COROENT LG 10X9X23-12 (Cage) ×8 IMPLANT
CANISTER SUCT 3000ML PPV (MISCELLANEOUS) ×3 IMPLANT
CAP RELINE MOD TULIP RMM (Cap) ×4 IMPLANT
CARTRIDGE OIL MAESTRO DRILL (MISCELLANEOUS) ×1 IMPLANT
CHIPS CANC BONE 20CC PCAN1/4 (Bone Implant) ×1 IMPLANT
CLIP NEUROVISION LG (CLIP) ×2 IMPLANT
CONT SPEC 4OZ CLIKSEAL STRL BL (MISCELLANEOUS) ×5 IMPLANT
COVER BACK TABLE 24X17X13 BIG (DRAPES) IMPLANT
COVER BACK TABLE 60X90IN (DRAPES) ×3 IMPLANT
DECANTER SPIKE VIAL GLASS SM (MISCELLANEOUS) ×1 IMPLANT
DERMABOND ADVANCED (GAUZE/BANDAGES/DRESSINGS) ×2
DERMABOND ADVANCED .7 DNX12 (GAUZE/BANDAGES/DRESSINGS) ×1 IMPLANT
DIFFUSER DRILL AIR PNEUMATIC (MISCELLANEOUS) ×3 IMPLANT
DRAPE C-ARM 42X72 X-RAY (DRAPES) ×3 IMPLANT
DRAPE C-ARMOR (DRAPES) ×3 IMPLANT
DRAPE LAPAROTOMY 100X72X124 (DRAPES) ×3 IMPLANT
DRAPE POUCH INSTRU U-SHP 10X18 (DRAPES) ×3 IMPLANT
DRAPE SURG 17X23 STRL (DRAPES) ×3 IMPLANT
DRSG OPSITE POSTOP 4X6 (GAUZE/BANDAGES/DRESSINGS) ×2 IMPLANT
DURAPREP 26ML APPLICATOR (WOUND CARE) ×3 IMPLANT
ELECT BLADE 4.0 EZ CLEAN MEGAD (MISCELLANEOUS) ×3
ELECT REM PT RETURN 9FT ADLT (ELECTROSURGICAL) ×3
ELECTRODE BLDE 4.0 EZ CLN MEGD (MISCELLANEOUS) IMPLANT
ELECTRODE REM PT RTRN 9FT ADLT (ELECTROSURGICAL) ×1 IMPLANT
EVACUATOR 1/8 PVC DRAIN (DRAIN) IMPLANT
GAUZE SPONGE 4X4 12PLY STRL (GAUZE/BANDAGES/DRESSINGS) ×1 IMPLANT
GAUZE SPONGE 4X4 16PLY XRAY LF (GAUZE/BANDAGES/DRESSINGS) IMPLANT
GLOVE BIO SURGEON STRL SZ7.5 (GLOVE) ×4 IMPLANT
GLOVE BIO SURGEON STRL SZ8 (GLOVE) ×8 IMPLANT
GLOVE BIOGEL PI IND STRL 7.0 (GLOVE) IMPLANT
GLOVE BIOGEL PI IND STRL 7.5 (GLOVE) IMPLANT
GLOVE BIOGEL PI IND STRL 8 (GLOVE) ×2 IMPLANT
GLOVE BIOGEL PI IND STRL 8.5 (GLOVE) ×2 IMPLANT
GLOVE BIOGEL PI INDICATOR 7.0 (GLOVE) ×2
GLOVE BIOGEL PI INDICATOR 7.5 (GLOVE) ×6
GLOVE BIOGEL PI INDICATOR 8 (GLOVE) ×4
GLOVE BIOGEL PI INDICATOR 8.5 (GLOVE) ×4
GLOVE ECLIPSE 8.0 STRL XLNG CF (GLOVE) ×6 IMPLANT
GLOVE EXAM NITRILE LRG STRL (GLOVE) IMPLANT
GLOVE EXAM NITRILE XL STR (GLOVE) IMPLANT
GLOVE EXAM NITRILE XS STR PU (GLOVE) IMPLANT
GLOVE INDICATOR 8.5 STRL (GLOVE) ×2 IMPLANT
GOWN STRL REUS W/ TWL LRG LVL3 (GOWN DISPOSABLE) IMPLANT
GOWN STRL REUS W/ TWL XL LVL3 (GOWN DISPOSABLE) ×3 IMPLANT
GOWN STRL REUS W/TWL 2XL LVL3 (GOWN DISPOSABLE) IMPLANT
GOWN STRL REUS W/TWL LRG LVL3 (GOWN DISPOSABLE)
GOWN STRL REUS W/TWL XL LVL3 (GOWN DISPOSABLE) ×9
GRAFT BNE CANC CHIPS 1-8 20CC (Bone Implant) IMPLANT
HEMOSTAT POWDER KIT SURGIFOAM (HEMOSTASIS) ×4 IMPLANT
KIT BASIN OR (CUSTOM PROCEDURE TRAY) ×3 IMPLANT
KIT POSITION SURG JACKSON T1 (MISCELLANEOUS) ×3 IMPLANT
KIT ROOM TURNOVER OR (KITS) ×3 IMPLANT
MARKER SKIN DUAL TIP RULER LAB (MISCELLANEOUS) ×2 IMPLANT
MODULE NVM5 NEXT GEN EMG (NEEDLE) ×2 IMPLANT
NDL HYPO 21X1.5 SAFETY (NEEDLE) IMPLANT
NDL HYPO 25X1 1.5 SAFETY (NEEDLE) ×1 IMPLANT
NDL SPNL 18GX3.5 QUINCKE PK (NEEDLE) IMPLANT
NEEDLE HYPO 21X1.5 SAFETY (NEEDLE) ×3 IMPLANT
NEEDLE HYPO 25X1 1.5 SAFETY (NEEDLE) ×3 IMPLANT
NEEDLE SPNL 18GX3.5 QUINCKE PK (NEEDLE) IMPLANT
NS IRRIG 1000ML POUR BTL (IV SOLUTION) ×3 IMPLANT
OIL CARTRIDGE MAESTRO DRILL (MISCELLANEOUS) ×3
PACK LAMINECTOMY NEURO (CUSTOM PROCEDURE TRAY) ×3 IMPLANT
PAD ARMBOARD 7.5X6 YLW CONV (MISCELLANEOUS) ×7 IMPLANT
PATTIES SURGICAL .5 X.5 (GAUZE/BANDAGES/DRESSINGS) IMPLANT
PATTIES SURGICAL .5 X1 (DISPOSABLE) IMPLANT
PATTIES SURGICAL 1X1 (DISPOSABLE) ×2 IMPLANT
ROD RELINE-O COCR 5.0X55MM (Rod) ×4 IMPLANT
SCREW LOCK RSS 4.5/5.0MM (Screw) ×12 IMPLANT
SCREW POLY RMM 5.5X40 4S (Screw) ×4 IMPLANT
SCREW RELINE RMM 6.5X40 4S (Screw) ×4 IMPLANT
SCREW SHANK RELINE MOD 5.5X35 (Screw) ×4 IMPLANT
SPONGE LAP 4X18 X RAY DECT (DISPOSABLE) IMPLANT
SPONGE SURGIFOAM ABS GEL 100 (HEMOSTASIS) ×3 IMPLANT
STAPLER SKIN PROX WIDE 3.9 (STAPLE) IMPLANT
SUT VIC AB 1 CT1 18XBRD ANBCTR (SUTURE) ×2 IMPLANT
SUT VIC AB 1 CT1 8-18 (SUTURE) ×6
SUT VIC AB 2-0 CT1 18 (SUTURE) ×6 IMPLANT
SUT VIC AB 3-0 SH 8-18 (SUTURE) ×6 IMPLANT
SYR 3ML LL SCALE MARK (SYRINGE) ×12 IMPLANT
SYR 5ML LL (SYRINGE) IMPLANT
SYRINGE 20CC LL (MISCELLANEOUS) ×2 IMPLANT
TOWEL GREEN STERILE (TOWEL DISPOSABLE) ×3 IMPLANT
TOWEL GREEN STERILE FF (TOWEL DISPOSABLE) ×3 IMPLANT
TRAY FOLEY W/METER SILVER 16FR (SET/KITS/TRAYS/PACK) ×3 IMPLANT
WATER STERILE IRR 1000ML POUR (IV SOLUTION) ×3 IMPLANT

## 2017-01-04 NOTE — Progress Notes (Signed)
Awake, alert, conversant.  MAEW with full strength.  Leg pain resolved.  Doing well.

## 2017-01-04 NOTE — Anesthesia Postprocedure Evaluation (Signed)
Anesthesia Post Note  Patient: SONYA PUCCI  Procedure(s) Performed: Procedure(s) (LRB): Lumbar four- five Lumbar five-Sacral one Maximum access posterior lumbar interbody fusion (N/A)     Patient location during evaluation: PACU Anesthesia Type: General Level of consciousness: awake and alert Pain management: pain level controlled Vital Signs Assessment: post-procedure vital signs reviewed and stable Respiratory status: spontaneous breathing, nonlabored ventilation and respiratory function stable Cardiovascular status: blood pressure returned to baseline and stable Postop Assessment: no signs of nausea or vomiting Anesthetic complications: no    Last Vitals:  Vitals:   01/04/17 1300 01/04/17 1304  BP:  104/64  Pulse: 69 66  Resp: 12 15  Temp:  36.2 C    Last Pain:  Vitals:   01/04/17 1304  TempSrc:   PainSc: 4     LLE Motor Response: Purposeful movement;Responds to commands (01/04/17 1304) LLE Sensation: Full sensation (01/04/17 1304) RLE Motor Response: Purposeful movement;Responds to commands (01/04/17 1304) RLE Sensation: Full sensation (01/04/17 1304)      Kaisley Stiverson,W. EDMOND

## 2017-01-04 NOTE — Interval H&P Note (Signed)
History and Physical Interval Note:  01/04/2017 7:19 AM  Edward [name]  has presented today for surgery, with the diagnosis of Radiculopathy, Lumbar region  The various methods of treatment have been discussed with the patient and family. After consideration of risks, benefits and other options for treatment, the patient has consented to  Procedure(s) with comments: L4-5 L5-S1 Maximum access posterior lumbar interbody fusion (N/A) - L4-5 L5-S1 Maximum access posterior lumbar interbody fusion as a surgical intervention .  The patient's history has been reviewed, patient examined, no change in status, stable for surgery.  I have reviewed the patient's chart and labs.  Questions were answered to the patient's satisfaction.     Edward [name] D

## 2017-01-04 NOTE — Anesthesia Procedure Notes (Signed)
Procedure Name: Intubation Date/Time: 01/04/2017 7:36 AM Performed by: Salli Quarry Annaliesa Blann Pre-anesthesia Checklist: Patient identified, Emergency Drugs available, Suction available and Patient being monitored Patient Re-evaluated:Patient Re-evaluated prior to inductionOxygen Delivery Method: Circle System Utilized Preoxygenation: Pre-oxygenation with 100% oxygen Intubation Type: IV induction Ventilation: Mask ventilation without difficulty Laryngoscope Size: Mac and 4 Grade View: Grade I Tube type: Oral Tube size: 7.5 mm Number of attempts: 1 Airway Equipment and Method: Stylet and Oral airway Placement Confirmation: ETT inserted through vocal cords under direct vision,  positive ETCO2 and breath sounds checked- equal and bilateral Secured at: 22 cm Tube secured with: Tape Dental Injury: Teeth and Oropharynx as per pre-operative assessment  Comments: Placed by Dicie Beam SRNA

## 2017-01-04 NOTE — Brief Op Note (Signed)
01/04/2017  11:56 AM  PATIENT:  Edward [name]  66 y.o. male  PRE-OPERATIVE DIAGNOSIS:  Recurrent lumbar disc herniation, lumbar scoliosis, stenosis, Radiculopathy, Lumbar region L 45 and L 5 S 1 levels  POST-OPERATIVE DIAGNOSIS:   Recurrent lumbar disc herniation, lumbar scoliosis, stenosis, Radiculopathy, Lumbar region L 45 and L 5 S 1 levels  PROCEDURE:  Procedure(s) with comments: Lumbar four- five Lumbar five-Sacral one Maximum access posterior lumbar interbody fusion (N/A) - Lumbar four- five Lumbar five-Sacral one Maximum access posterior lumbar interbody fusion with PEEK cages, autograft, pedicle screw fixation and posterolateral arthrodesis  Redo decompression greater than standard PLIF procedure   SURGEON:  Surgeon(s) and Role:    Erline Levine, MD - Primary  PHYSICIAN ASSISTANT:   ASSISTANTS: Poteat, RN   ANESTHESIA:   general  EBL:  Total I/O In: 1565 [I.V.:1400; Blood:165] Out: 690 [Urine:390; Blood:300]  BLOOD ADMINISTERED:none  DRAINS: none   LOCAL MEDICATIONS USED:  MARCAINE    and LIDOCAINE   SPECIMEN:  No Specimen  DISPOSITION OF SPECIMEN:  N/A  COUNTS:  YES  TOURNIQUET:  * No tourniquets in log *  DICTATION: DICTATION: Patient is a 66 year old with scoliosis,  spondylosis , stenosis, recurrent disc herniation and severe back and bilateral lower extremity pain (right greater than left) at L4/5 and L 5 S 1 levels of the lumbar spine.  It was elected to take him to surgery for redo discectomy and MASPLIF at  L 45 and L 5 S 1  levels with posterolateral arthrodesis.  Procedure:   Following uncomplicated induction of GETA, and placement of electrodes for neural monitoring, patient was turned into a prone position on the [place] tableand using AP  fluoroscopy the area of planned incision was marked, prepped with betadine scrub and Duraprep, then draped. Exposure was performed of facet joint complex at L 45 and L 5 S 1 levels and the MAS retractor was  placed.5.5 x 35 mm cortical Nuvasive screws were placed at L 4 bilaterally according to standard landmarks using neural monitoring.  A total laminectomy of L 4 and L 5 was then performed with disarticulation of facets.  Decompression was greater than for standard PLIF procedure and thorough decompression of the thecal sac, bilateral L 4, L 5, S1 nerve roots was performed with significant scar tissue identified and decompressed at the right L 5 S 1 level.  . Along with foraminal and extraforaminal portions of these nerve roots.  This bone was saved for grafting, combined with allograft bone chips after being run through bone mill and was placed in bone packing device.  Thorough discectomy was performed bilaterally at L 5 S 1  and the endplates were prepared for grafting.  23 x 10 x 12 degree cages were placed in the interspace and positioning was confirmed with AP and lateral fluoroscopy.  10 cc of autograft was packed in the interspace medial to the second cage.   Thorough discectomy was performed bilaterally at L 45  and the endplates were prepared for grafting.  23 x 10 x 12 degree cages were placed in the interspace and positioning was confirmed with AP and lateral fluoroscopy.  10 cc of autograft was packed in the interspace medial to the second cage.   Remaining screws were placed at L 5 (5.5 x 40 mm) and S 1 (6.5 x 40 mm) and 55 mm rods were placed. And the screws were locked and torqued.Final Xrays showed well positioned implants and screw fixation. The posterolateral  region on each side with remaining 10 cc of autograft/allograft on each side. The wounds were irrigated and then closed with 1, 2-0 and 3-0 Vicryl stitches. 20 cc long-acting Marcaine was injected into the musculature.  Sterile occlusive dressing was placed with Dermabond and an occlusive dressing. The patient was then extubated in the operating room and taken to recovery in stable and satisfactory condition having tolerated her operation well.  Counts were correct at the end of the case.  PLAN OF CARE: Admit to inpatient   PATIENT DISPOSITION:  PACU - hemodynamically stable.   Delay start of Pharmacological VTE agent (>24hrs) due to surgical blood loss or risk of bleeding: yes

## 2017-01-04 NOTE — Transfer of Care (Signed)
Immediate Anesthesia Transfer of Care Note  Patient: Edward [name]  Procedure(s) Performed: Procedure(s) with comments: Lumbar four- five Lumbar five-Sacral one Maximum access posterior lumbar interbody fusion (N/A) - Lumbar four- five Lumbar five-Sacral one Maximum access posterior lumbar interbody fusion  Patient Location: PACU  Anesthesia Type:General  Level of Consciousness: awake, alert  and patient cooperative  Airway & Oxygen Therapy: Patient Spontanous Breathing and Patient connected to nasal cannula oxygen  Post-op Assessment: Report given to RN and Post -op Vital signs reviewed and stable  Post vital signs: Reviewed and stable  Last Vitals:  Vitals:   01/04/17 0551  BP: 111/72  Pulse: 62  Resp: 16  Temp: 36.7 C    Last Pain:  Vitals:   01/04/17 0551  TempSrc: Oral  PainSc: 4       Patients Stated Pain Goal: 4 (73/56/70 1410)  Complications: No apparent anesthesia complications

## 2017-01-04 NOTE — Op Note (Signed)
01/04/2017  11:56 AM  PATIENT:  Edward [name]  66 y.o. male  PRE-OPERATIVE DIAGNOSIS:  Recurrent lumbar disc herniation, lumbar scoliosis, stenosis, Radiculopathy, Lumbar region L 45 and L 5 S 1 levels  POST-OPERATIVE DIAGNOSIS:   Recurrent lumbar disc herniation, lumbar scoliosis, stenosis, Radiculopathy, Lumbar region L 45 and L 5 S 1 levels  PROCEDURE:  Procedure(s) with comments: Lumbar four- five Lumbar five-Sacral one Maximum access posterior lumbar interbody fusion (N/A) - Lumbar four- five Lumbar five-Sacral one Maximum access posterior lumbar interbody fusion with PEEK cages, autograft, pedicle screw fixation and posterolateral arthrodesis  Redo decompression greater than standard PLIF procedure   SURGEON:  Surgeon(s) and Role:    Erline Levine, MD - Primary  PHYSICIAN ASSISTANT:   ASSISTANTS: Poteat, RN   ANESTHESIA:   general  EBL:  Total I/O In: 1565 [I.V.:1400; Blood:165] Out: 690 [Urine:390; Blood:300]  BLOOD ADMINISTERED:none  DRAINS: none   LOCAL MEDICATIONS USED:  MARCAINE    and LIDOCAINE   SPECIMEN:  No Specimen  DISPOSITION OF SPECIMEN:  N/A  COUNTS:  YES  TOURNIQUET:  * No tourniquets in log *  DICTATION: DICTATION: Patient is a 66 year old with scoliosis,  spondylosis , stenosis, recurrent disc herniation and severe back and bilateral lower extremity pain (right greater than left) at L4/5 and L 5 S 1 levels of the lumbar spine.  It was elected to take him to surgery for redo discectomy and MASPLIF at  L 45 and L 5 S 1  levels with posterolateral arthrodesis.  Procedure:   Following uncomplicated induction of GETA, and placement of electrodes for neural monitoring, patient was turned into a prone position on the [place] tableand using AP  fluoroscopy the area of planned incision was marked, prepped with betadine scrub and Duraprep, then draped. Exposure was performed of facet joint complex at L 45 and L 5 S 1 levels and the MAS retractor was  placed.5.5 x 35 mm cortical Nuvasive screws were placed at L 4 bilaterally according to standard landmarks using neural monitoring.  A total laminectomy of L 4 and L 5 was then performed with disarticulation of facets.  Decompression was greater than for standard PLIF procedure and thorough decompression of the thecal sac, bilateral L 4, L 5, S1 nerve roots was performed with significant scar tissue identified and decompressed at the right L 5 S 1 level.  . Along with foraminal and extraforaminal portions of these nerve roots.  This bone was saved for grafting, combined with allograft bone chips after being run through bone mill and was placed in bone packing device.  Thorough discectomy was performed bilaterally at L 5 S 1  and the endplates were prepared for grafting.  23 x 10 x 12 degree cages were placed in the interspace and positioning was confirmed with AP and lateral fluoroscopy.  10 cc of autograft was packed in the interspace medial to the second cage.   Thorough discectomy was performed bilaterally at L 45  and the endplates were prepared for grafting.  23 x 10 x 12 degree cages were placed in the interspace and positioning was confirmed with AP and lateral fluoroscopy.  10 cc of autograft was packed in the interspace medial to the second cage.   Remaining screws were placed at L 5 (5.5 x 40 mm) and S 1 (6.5 x 40 mm) and 55 mm rods were placed. And the screws were locked and torqued.Final Xrays showed well positioned implants and screw fixation. The posterolateral  region on each side with remaining 10 cc of autograft/allograft on each side. The wounds were irrigated and then closed with 1, 2-0 and 3-0 Vicryl stitches. 20 cc long-acting Marcaine was injected into the musculature.  Sterile occlusive dressing was placed with Dermabond and an occlusive dressing. The patient was then extubated in the operating room and taken to recovery in stable and satisfactory condition having tolerated her operation well.  Counts were correct at the end of the case.  PLAN OF CARE: Admit to inpatient   PATIENT DISPOSITION:  PACU - hemodynamically stable.   Delay start of Pharmacological VTE agent (>24hrs) due to surgical blood loss or risk of bleeding: yes

## 2017-01-04 NOTE — Evaluation (Signed)
Physical Therapy Evaluation Patient Details Name: Edward [name] MRN: [id number] DOB: 1951-05-07 Today's Date: 01/04/2017   History of Present Illness  Pt is a 66 y/o male s/p L4-S1 PLIF. PMH includes skin cancer, R TKA, and cervical fusion.   Clinical Impression  Pt is s/p surgery above with deficits below. PTA, pt was independent with functional mobility, however, has R Knee pain at baseline secondary to complications with R TKA. Upon evaluation, pt limited by R knee buckling, post surgical pain, and nausea. Attempted gait without AD and HHA, however, pt very unsteady and reaching for rails on wall. Required min A for steadying. With use of RW, increased stability, and required min guard for safety. Educated about use of RW for safety at home. Pt reports he will be staying with sister at home and has all DME. Will continue to follow acutely to maximize functional mobility independence.     Follow Up Recommendations No PT follow up;Supervision/Assistance - 24 hour    Equipment Recommendations  None recommended by PT    Recommendations for Other Services       Precautions / Restrictions Precautions Precautions: Back Precaution Booklet Issued: Yes (comment) Precaution Comments: Reviewed back precautions with pt.  Required Braces or Orthoses: Spinal Brace Spinal Brace: Lumbar corset;Applied in sitting position Restrictions Weight Bearing Restrictions: No      Mobility  Bed Mobility Overal bed mobility: Needs Assistance Bed Mobility: Rolling;Sidelying to Sit;Sit to Sidelying Rolling: Min guard Sidelying to sit: Min assist     Sit to sidelying: Min assist General bed mobility comments: Min A with trunk elevation and Min A for lifting LE back to bed upon return to supine. Verbal cues for log roll technique.   Transfers Overall transfer level: Needs assistance Equipment used: None;Rolling walker (2 wheeled) Transfers: Sit to/from Stand Sit to Stand: Min assist          General transfer comment: Min A for lift assist and steadying secondary to RLE pain and back pain.   Ambulation/Gait Ambulation/Gait assistance: Min assist;Min guard Ambulation Distance (Feet): 50 Feet Assistive device: Rolling walker (2 wheeled);1 person hand held assist Gait Pattern/deviations: Step-through pattern;Decreased weight shift to right;Antalgic Gait velocity: Decreased Gait velocity interpretation: Below normal speed for age/gender General Gait Details: Slow, antalgic gait. 18' X 2; once with HHA and once with RW. Pt unsteady with HHA and reaching for railing on wall for support. Pt with RLE knee buckling, therefore use of RW during second gait training. Increased stability with use of RW, however, limited secondary to reports of nausea  Stairs            Wheelchair Mobility    Modified Rankin (Stroke Patients Only)       Balance Overall balance assessment: Needs assistance Sitting-balance support: No upper extremity supported;Feet supported Sitting balance-Leahy Scale: Good     Standing balance support: Bilateral upper extremity supported;During functional activity Standing balance-Leahy Scale: Poor Standing balance comment: Reliant on BUE support for balance                              Pertinent Vitals/Pain Pain Assessment: 0-10 Pain Score: 5  Pain Location: back  Pain Descriptors / Indicators: Aching;Operative site guarding;Sore Pain Intervention(s): Limited activity within patient's tolerance;Monitored during session;Repositioned    Home Living Family/patient expects to be discharged to:: Private residence Living Arrangements: Other (Comment) (will be staying with sister) Available Help at Discharge: Family;Available 24 hours/day Type of Home:  House Home Access: Other (comment) (threshold step )     Home Layout: One level Home Equipment: Cane - single point;Walker - 2 wheels;Bedside commode;Shower seat - built in Additional  Comments: pt going to sister's at D/C    Prior Function Level of Independence: Independent               Hand Dominance        Extremity/Trunk Assessment   Upper Extremity Assessment Upper Extremity Assessment: Defer to OT evaluation    Lower Extremity Assessment Lower Extremity Assessment: RLE deficits/detail RLE Deficits / Details: Pain and weakness at baseline secondary to complications with R TKA    Cervical / Trunk Assessment Cervical / Trunk Assessment: Other exceptions Cervical / Trunk Exceptions: s/p back surgery   Communication   Communication: No difficulties  Cognition Arousal/Alertness: Awake/alert Behavior During Therapy: WFL for tasks assessed/performed Overall Cognitive Status: Within Functional Limits for tasks assessed                                        General Comments General comments (skin integrity, edema, etc.): Pt reporting nausea which limited mobility tolerance. Notified RN. Educated about generalized walking program to perform at home.     Exercises     Assessment/Plan    PT Assessment Patient needs continued PT services  PT Problem List Decreased strength;Decreased activity tolerance;Decreased balance;Decreased mobility;Decreased knowledge of use of DME;Decreased knowledge of precautions;Pain       PT Treatment Interventions DME instruction;Gait training;Stair training;Functional mobility training;Therapeutic activities;Therapeutic exercise;Balance training;Neuromuscular re-education;Patient/family education    PT Goals (Current goals can be found in the Care Plan section)  Acute Rehab PT Goals Patient Stated Goal: to decrease pain  PT Goal Formulation: With patient Time For Goal Achievement: 01/11/17 Potential to Achieve Goals: Good    Frequency Min 5X/week   Barriers to discharge        Co-evaluation               AM-PAC PT "6 Clicks" Daily Activity  Outcome Measure Difficulty turning over in  bed (including adjusting bedclothes, sheets and blankets)?: A Little Difficulty moving from lying on back to sitting on the side of the bed? : Total Difficulty sitting down on and standing up from a chair with arms (e.g., wheelchair, bedside commode, etc,.)?: Total Help needed moving to and from a bed to chair (including a wheelchair)?: A Little Help needed walking in hospital room?: A Little Help needed climbing 3-5 steps with a railing? : A Lot 6 Click Score: 13    End of Session Equipment Utilized During Treatment: Gait belt;Back brace Activity Tolerance: Treatment limited secondary to medical complications (Comment);Patient limited by pain (nausea) Patient left: in bed;with call bell/phone within reach Nurse Communication: Mobility status;Other (comment) (nausea) PT Visit Diagnosis: Other abnormalities of gait and mobility (R26.89);Pain Pain - part of body:  (back)    Time: 9417-4081 PT Time Calculation (min) (ACUTE ONLY): 28 min   Charges:   PT Evaluation $PT Eval Low Complexity: 1 Procedure PT Treatments $Gait Training: 8-22 mins   PT G Codes:        Leighton Ruff, PT, DPT  Acute Rehabilitation Services  Pager: [phone]   Rudean Hitt 01/04/2017, 4:38 PM

## 2017-01-04 NOTE — H&P (Signed)
Patient ID:   [id number] Patient: Edward [name]  Date of Birth: 19-May-1951 Visit Type: Office Visit   Date: 12/25/2016 08:45 AM Provider: Marchia Meiers. Vertell Limber MD   This 66 year old male presents for back pain.   History of Present Illness: 1.  back pain  Patient returns to review his MRI. Patient reports pain in the buttocks radiating down the leg into the toes, and slight pain to the back. Patient reports prednisone pack only temporarily relieved pain. He notes the pain is aggravated by standing and sitting.  MRI 12/14/16: Shallow right extraforaminal disc protrusion at L5-S1, contacting and potentially irritating the exiting right L5 nerve root. Small right foraminal/ extraforaminal disc protrusion at L4-5 without frank neural impingement. This is less prominent as compared to most recent MRI from 01/04/2009. Left-sided facet joint arthritis at L4-5 with associated reactive enhancement. Stable postsurgical changes at L5-S1.         Medical/Surgical/Interim History Reviewed, no change.  Last detailed document date:12/06/2016.     Family History: Reviewed, no changes.  Last detailed document date:12/06/2016.   Social History: Reviewed, no changes. Last detailed document date: 12/06/2016.    MEDICATIONS(added, continued or stopped this visit): Started Medication Directions Instruction Stopped   aspirin 81 mg chewable tablet chew 1 tablet by oral route  every day     B12 5,000 mcg-100 mcg sublingual lozenge      Crestor 20 mg tablet take 1 tablet by oral route  every day     cyanocobalamin 2 mg-levomefolate cal 1.13 mg-pyridoxine 25 mg tablet      Fish Oil 100 mg-160 mg-1,000 mg capsule      hydrocodone 5 mg-acetaminophen 325 mg tablet take 1 tablet by oral route  every 6 hours as needed for pain     losartan 50 mg tablet take 1 tablet by oral route  every day    12/13/2016 Medrol (Pak) 4 mg tablets in a dose pack take by Oral route as directed  12/25/2016   multivitamin tablet       nitroglycerin 0.4 mg sublingual tablet place 1 tablet by sublingual route at 1st sign of attack; may repeat every 5 minutes up to 3 tabs; if norelief seek medical help     Protonix 40 mg tablet,delayed release take 1 tablet by oral route  every day     Xanax 0.5 mg tablet take 1 tablet by oral route  every day       ALLERGIES: Ingredient Reaction Medication Name Comment  NO KNOWN ALLERGIES     No known allergies. Reviewed, no changes.    Vitals Date Temp F BP Pulse Ht In Wt Lb BMI BSA Pain Score  12/25/2016  114/77 69 69 195.2 28.83  6/10      IMPRESSION MRI shows a bone spur at L5-S1 compressing the right nerve. Disc herniation noted at L4-5 compressing the right nerve.  X-ray shows scoliosis in the lower part of the spine showing nerve compression at L5-S1 and L4-5 levels causing foraminal stenosis on the right side with disc herniation at both levels.  He has foraminal stenosis at the L 45 and L 5 S 1 levels on the right, the side of his scoliosis, which is causing nerve root compression in addition to the disc herniation.  Completed Orders (this encounter) Order Details Reason Side Interpretation Result Initial Treatment Date Region  Dietary management education, guidance, and counseling patient encouraged to eat a well balanced diet  Assessment/Plan # Detail Type Description   1. Assessment Body mass index (BMI) 28.0-28.9, adult (U44.03).   Plan Orders Today's instructions / counseling include(s) Dietary management education, guidance, and counseling.           Pain Management Plan Pain Scale: 6/10. Method: Numeric Pain Intensity Scale. Location: back. Onset: 12/08/2013. Duration: varies. Quality: discomforting. Pain management follow-up plan of care: Patient taking medication as prescribed..  Fall Risk Plan The patient has not fallen in the last year.  Patient will be scheduled for L5-S1 and L4-5 MAS PLIF and given an LOS brace. Nurse education  given. Patient was offered injections. He declined because injections have only given temporary relief in the past.  In light of patient's prior L 5 S 1 surgery, new foraminal and extraforaminal disc herniation at the L 5 S 1 level on the right, scoliosis with foraminal stenosis at both the L 45 and L 5 S 1 levels, my concern is that standard discectomy is likely to render his spine unstable and will be unlikely to give the patient long term relief of his radicular symptoms.  I have therefore recommended facetectomies at the L 45 and L 5 S 1 levels with discectomies at each level on the right to adequately decompress the nerve roots and give the patient long term relief of his pain.  While doing so will most likely relieve the patient's symptoms, this will also destabilize his spine.  Decompression and microdiscectomy alone are unlikely to provide the patient with long term relief of his symptoms.  Orders: Instruction(s)/Education: Assessment Instruction  [phone] Dietary management education, guidance, and counseling             Provider:  Vertell Limber MD, Marchia Meiers 12/25/2016 9:18 AM  Dictation edited by: Lucita Lora    CC Providers: [name] [address] [place] [address] [place],  [place]  [address]-   Richard Ramos  [address], [address] [place], [place] [address]              Electronically signed by Marchia Meiers. Vertell Limber MD on 12/28/2016 04:05 PM  Patient ID:   [id number] Patient: Edward [name]  Date of Birth: 11-Aug-1950 Visit Type: Office Visit   Date: 12/06/2016 03:30 PM Provider: Marchia Meiers. Vertell Limber MD   This 66 year old male presents for back pain.   History of Present Illness: 1.  back pain  Edward [name], 66 year old retired male returns for evaluation reporting neck and right buttock pain.  He was last seen in 2015 following C5-6 ACDF.  Patient reports longstanding neck issues after his initial injury; but notes "popping" and sharp pains with range of  motion recently.  He also notes severe right buttock and right leg pain x1 year. He has trouble standing for longer than 2 minutes.   Norco 5/325 t.i.d. Lyrica 75 mg 1 q.h.s.  History:  Skin cancer, HTN Surgical history:  Mohs 11/08/2016, right knee revision October 2017, right TKR September 2016, C5-6 ACDF 2014, C6-7 ACDF 2010, right L5-S1 microdiskectomy 2008  X-rays on canopy           PAST MEDICAL/SURGICAL HISTORY   (Detailed)  Disease/disorder Onset Date Management Date Comments    MOHS micrography      Discectomy, lumbar      Spinal fusion, cervical    Cancer, skin    JLB 12/06/2016 -  Hyperlipidemia        Knee replacement       Family History  (Detailed)  Patient reports there is no relevant family history.     Social History:  (Detailed) Tobacco use reviewed. Preferred language is Unknown.   MARITAL STATUS/FAMILY/SOCIAL SUPPORT Currently divorced.   Tobacco use status: Current non-smoker. Smoking status: Never smoker.  SMOKING STATUS Type Smoking Status Usage Per Day Years Used Total Pack Years   Never smoker      HOME ENVIRONMENT/SAFETY The patient is not at risk for falls. The patient has not fallen in the last year.     MEDICATIONS(added, continued or stopped this visit): Started Medication Directions Instruction Stopped   aspirin 81 mg chewable tablet chew 1 tablet by oral route  every day     B12 5,000 mcg-100 mcg sublingual lozenge      Crestor 20 mg tablet take 1 tablet by oral route  every day     cyanocobalamin 2 mg-levomefolate cal 1.13 mg-pyridoxine 25 mg tablet      Fish Oil 100 mg-160 mg-1,000 mg capsule      hydrocodone 5 mg-acetaminophen 325 mg tablet take 1 tablet by oral route  every 6 hours as needed for pain     losartan 50 mg tablet take 1 tablet by oral route  every day     multivitamin tablet      Protonix 40 mg tablet,delayed release take 1 tablet by oral route  every day     Xanax 0.5 mg tablet take 1 tablet by oral route   every day       ALLERGIES: Ingredient Reaction Medication Name Comment  NO KNOWN ALLERGIES     No known allergies. Reviewed, no changes.    Vitals Date Temp F BP Pulse Ht In Wt Lb BMI BSA Pain Score  12/06/2016  120/73 64 69 194.6 28.74  7/10     PHYSICAL EXAM General Level of Distress: no acute distress Overall Appearance: normal  Head and Face  Right Left  Fundoscopic Exam:  normal normal    Cardiovascular Cardiac: regular rate and rhythm without murmur  Right Left  Carotid Pulses: normal normal  Respiratory Lungs: clear to auscultation  Neurological Orientation: normal Recent and Remote Memory: normal Attention Span and Concentration:   normal Language: normal Fund of Knowledge: normal  Right Left Sensation: normal normal Upper Extremity Coordination: normal normal  Lower Extremity Coordination: normal normal  Musculoskeletal Gait and Station: normal  Right Left Upper Extremity Muscle Strength: normal normal Lower Extremity Muscle Strength: normal normal Upper Extremity Muscle Tone:  normal normal Lower Extremity Muscle Tone: normal normal   Motor Strength Upper and lower extremity motor strength was tested in the clinically pertinent muscles.     Deep Tendon Reflexes  Right Left Biceps: normal normal Triceps: normal normal Brachioradialis: normal normal Patellar: normal normal Achilles: normal normal  Cranial Nerves II. Optic Nerve/Visual Fields: normal III. Oculomotor: normal IV. Trochlear: normal V. Trigeminal: normal VI. Abducens: normal VII. Facial: normal VIII. Acoustic/Vestibular: normal IX. Glossopharyngeal: normal X. Vagus: normal XI. Spinal Accessory: normal XII. 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   Completed Orders (this encounter) Order Details Reason Side Interpretation Result Initial Treatment Date Region  Cervical Spine- AP/Lat/Flex/Ex 1 of 2     12/06/2016 All Levels to All Levels  Lumbar Spine- AP/Lat/Flex/Ex 2 of 2     12/06/2016 All Levels to All Levels   Assessment/Plan # Detail Type Description   1. Assessment Neck pain (M54.2).       2. Assessment Lumbar radiculopathy (M54.16).           Pain Management Plan Pain Scale: 7/10. Method: Numeric Pain Intensity Scale. Location: back and down leg. Duration: varies. Quality: discomforting. Pain management follow-up plan of care: Patient does positional changes for relief.  Fall Risk Plan The patient has not fallen in the last year.  scheduled new lumbar MRI. follow up after MRI.   Orders: Diagnostic Procedures: Assessment Procedure  M54.16 Lumbar Spine- AP/Lat/Flex/Ex  M54.16 MRI Spine/lumb With & W/o Contrast  M54.2 Cervical Spine- AP/Lat/Flex/Ex             Provider:  Vertell Limber MD, Marchia Meiers 12/06/2016 5:28 PM  Dictation edited by: Dionne Bucy    CC Providers: Suella Broad  [address] [place], [place] [address]              Electronically signed by Marchia Meiers. Vertell Limber MD on 12/07/2016 05:31 PM

## 2017-01-04 NOTE — Progress Notes (Signed)
receiced report from [place] rn for her lunch relief

## 2017-01-05 ENCOUNTER — Encounter (HOSPITAL_COMMUNITY): Payer: Self-pay | Admitting: Neurosurgery

## 2017-01-05 MED ORDER — HYDROCODONE-ACETAMINOPHEN 10-325 MG PO TABS
1.0000 | ORAL_TABLET | ORAL | 0 refills | Status: DC | PRN
Start: 2017-01-05 — End: 2017-04-09

## 2017-01-05 MED ORDER — METHOCARBAMOL 500 MG PO TABS: 500.0000 mg | ORAL_TABLET | Freq: Four times a day (QID) | ORAL | 1 refills | Status: DC | PRN

## 2017-01-05 NOTE — Progress Notes (Addendum)
Subjective: Patient reports "I'm ok...hurts some right in my low back"  Objective: Vital signs in last 24 hours: Temp:  [97.2 F (36.2 C)-98.6 F (37 C)] 98.4 F (36.9 C) (06/29 0735) Pulse Rate:  [61-119] 61 (06/29 0735) Resp:  [12-20] 18 (06/29 0735) BP: (93-112)/(57-69) 112/59 (06/29 0735) SpO2:  [92 %-98 %] 94 % (06/29 0735)  Intake/Output from previous day: 06/28 0701 - 06/29 0700 In: 2305 [P.O.:440; I.V.:1450; Blood:165] Out: 0092 [ZRAQT:6226; Blood:300] Intake/Output this shift: No intake/output data recorded.  Alert, conversant, reports mild nausea with activity. Sister present. Good strength BLE. Incision without erythema, swelling, or drainage beneath honeycomb & Dermabond. No leg pain, only incisional pain.   Lab Results:  Recent Labs  01/02/17 0828  WBC 5.0  HGB 14.8  HCT 42.9  PLT 135*   BMET  Recent Labs  01/02/17 0828  NA 138  K 3.8  CL 107  CO2 23  GLUCOSE 97  BUN 8  CREATININE 0.77  CALCIUM 9.1    Studies/Results: Dg Lumbar Spine 2-3 Views  Result Date: 01/04/2017 CLINICAL DATA:  L4-S1 fusion EXAM: DG C-ARM 61-120 MIN; LUMBAR SPINE - 2-3 VIEW COMPARISON:  MRI 12/13/2016 FINDINGS: Intraoperative spot imaging demonstrate placement of pedicle screws from L4-S1. No hardware complicating feature. Normal alignment. IMPRESSION: Posterior fusion changes L4-S1. Electronically Signed   By: Rolm Baptise M.D.   On: 01/04/2017 11:30   Dg C-arm 61-120 Min  Result Date: 01/04/2017 CLINICAL DATA:  L4-S1 fusion EXAM: DG C-ARM 61-120 MIN; LUMBAR SPINE - 2-3 VIEW COMPARISON:  MRI 12/13/2016 FINDINGS: Intraoperative spot imaging demonstrate placement of pedicle screws from L4-S1. No hardware complicating feature. Normal alignment. IMPRESSION: Posterior fusion changes L4-S1. Electronically Signed   By: Rolm Baptise M.D.   On: 01/04/2017 11:30    Assessment/Plan: Improving  LOS: 1 day  Mobilize in LSO with PT.    Verdis Prime 01/05/2017, 8:16 AM  Patient  is doing well.  Mobilize as tolerated today.

## 2017-01-05 NOTE — Discharge Summary (Signed)
Physician Discharge Summary  Patient ID: Edward [name] MRN: [id number] DOB/AGE: 64-Jun-1952 64 y.o.  Admit date: 01/04/2017 Discharge date: 01/05/2017  Admission Diagnoses:Lumbar scoliosis, stenosis, herniated lumbar disc, radiculopathy, lumbago L 45, L 5 S 1 levels  Discharge Diagnoses: Lumbar scoliosis, stenosis, herniated lumbar disc, radiculopathy, lumbago L 45, L 5 S 1 levels  Active Problems:   Idiopathic scoliosis of lumbar region   Discharged Condition: good  Hospital Course: Patient underwent uncomplicated redo discectomy and MAS PLIF L 45 and L 5 S 1 levels and did well postoperatively.  He was mobilizing on POD 1 and was discharged home.  Consults: None  Significant Diagnostic Studies: None  Treatments: surgery: uncomplicated redo discectomy and MAS PLIF L 45 and L 5 S 1 levels  Discharge Exam: Blood pressure 107/63, pulse 73, temperature 98.2 F (36.8 C), temperature source Oral, resp. rate 20, height 5\' 9"  (1.753 m), weight 87.5 kg (193 lb), SpO2 96 %. Neurologic: Alert and oriented X 3, normal strength and tone. Normal symmetric reflexes. Normal coordination and gait Wound:CDI  Disposition: Home  Discharge Instructions    Diet - low sodium heart healthy    Complete by:  As directed    Increase activity slowly    Complete by:  As directed      Allergies as of 01/05/2017      Reactions   Nsaids Other (See Comments)   UNSPECIFIED REACTION  Heart troubles - MD suggested stopping   Percocet [oxycodone-acetaminophen] Other (See Comments)   "feel funny"      Medication List    TAKE these medications   ALPRAZolam 0.5 MG tablet Commonly known as:  XANAX Take 0.5 mg by mouth at bedtime as needed for sleep.   aspirin 325 MG tablet Take 325 mg by mouth at bedtime.   docusate sodium 100 MG capsule Commonly known as:  COLACE Take 1 capsule (100 mg total) by mouth 2 (two) times daily. What changed:  when to take this  reasons to take this   ferrous  sulfate 325 (65 FE) MG tablet Take 1 tablet (325 mg total) by mouth 3 (three) times daily after meals.   HYDROcodone-acetaminophen 5-325 MG tablet Commonly known as:  NORCO/VICODIN Take 1 tablet by mouth every 8 (eight) hours as needed for pain. What changed:  Another medication with the same name was added. Make sure you understand how and when to take each.   HYDROcodone-acetaminophen 10-325 MG tablet Commonly known as:  NORCO Take 1-2 tablets by mouth every 4 (four) hours as needed. What changed:  You were already taking a medication with the same name, and this prescription was added. Make sure you understand how and when to take each.   losartan 100 MG tablet Commonly known as:  COZAAR Take 50 mg by mouth every evening.   LYRICA 75 MG capsule Generic drug:  pregabalin Take 75 mg by mouth at bedtime.   methocarbamol 500 MG tablet Commonly known as:  ROBAXIN Take 1 tablet (500 mg total) by mouth every 6 (six) hours as needed for muscle spasms.   nitroGLYCERIN 0.4 MG SL tablet Commonly known as:  NITROSTAT Place 1 tablet (0.4 mg total) under the tongue every 5 (five) minutes as needed for chest pain.   pantoprazole 40 MG tablet Commonly known as:  PROTONIX Take 40 mg by mouth daily as needed (for acid reflux).   polyethylene glycol packet Commonly known as:  MIRALAX / GLYCOLAX Take 17 g by mouth 2 (two) times daily.  rosuvastatin 20 MG tablet Commonly known as:  CRESTOR TAKE 1 TABLET BY MOUTH  DAILY What changed:  See the new instructions.   tamsulosin 0.4 MG Caps capsule Commonly known as:  FLOMAX Take 0.4 mg by mouth daily.        Signed: Peggyann Shoals, MD 01/05/2017, 11:58 AM

## 2017-01-05 NOTE — Evaluation (Signed)
Occupational Therapy Evaluation and Discharge Patient Details Name: Edward [name] MRN: [id number] DOB: Dec 07, 1950 Today's Date: 01/05/2017    History of Present Illness Pt is a 66 y/o male s/p L4-S1 PLIF. PMH includes skin cancer, R TKA, and cervical fusion.    Clinical Impression   This 66 yo male admitted and underwent above presents to acute OT with all education completed, we will D?C from acute OT.    Follow Up Recommendations  No OT follow up;Supervision/Assistance - 24 hour    Equipment Recommendations  3 in 1 bedside commode       Precautions / Restrictions Precautions Precautions: Back Precaution Comments: Reviewed back precautions with pt.  Required Braces or Orthoses: Spinal Brace Spinal Brace: Lumbar corset;Applied in sitting position Restrictions Weight Bearing Restrictions: No      Mobility Bed Mobility Overal bed mobility: Modified Independent Bed Mobility: Rolling;Sidelying to Sit              Transfers Overall transfer level: Needs assistance Equipment used: Rolling walker (2 wheeled) Transfers: Sit to/from Stand Sit to Stand: Supervision                  ADL either performed or assessed with clinical judgement   ADL                                         General ADL Comments: Educated pt and sister on use of wet wipes for back peri-care to help avoid twisting, use of 2 cups for brushing teeth to avoid bending over sink, most effcient and supportive way to get dressed, and toilet transfers. He cannot do all of his LBD right now (his sister will A him prn)     Vision Patient Visual Report: No change from baseline              Pertinent Vitals/Pain Pain Assessment: 0-10 Pain Score: 3  Pain Location: back  Pain Descriptors / Indicators: Sore Pain Intervention(s): Limited activity within patient's tolerance;Repositioned;Monitored during session     Hand Dominance Right   Extremity/Trunk Assessment Upper  Extremity Assessment Upper Extremity Assessment: Overall WFL for tasks assessed           Communication Communication Communication: No difficulties   Cognition Arousal/Alertness: Awake/alert Behavior During Therapy: WFL for tasks assessed/performed Overall Cognitive Status: Within Functional Limits for tasks assessed                                                Home Living Family/patient expects to be discharged to:: Private residence Living Arrangements: Other (Comment) (will be staying with sister) Available Help at Discharge: Family;Available 24 hours/day Type of Home: House       Home Layout: One level     Bathroom Shower/Tub: Occupational psychologist: Standard     Home Equipment: Cane - single point;Walker - 2 wheels;Shower seat - built in;Hand held shower head;Grab bars - tub/shower   Additional Comments: pt going to sister's at D/C      Prior Functioning/Environment Level of Independence: Independent                 OT Problem List: Decreased strength;Pain;Impaired balance (sitting and/or standing)         OT  Goals(Current goals can be found in the care plan section) Acute Rehab OT Goals Patient Stated Goal: home today  OT Frequency:                AM-PAC PT "6 Clicks" Daily Activity     Outcome Measure Help from another person eating meals?: None Help from another person taking care of personal grooming?: None Help from another person toileting, which includes using toliet, bedpan, or urinal?: None Help from another person bathing (including washing, rinsing, drying)?: A Little Help from another person to put on and taking off regular upper body clothing?: None Help from another person to put on and taking off regular lower body clothing?: A Lot 6 Click Score: 21   End of Session Equipment Utilized During Treatment: Rolling walker;Back brace Nurse Communication:  (pt needs a 3n1)  Activity Tolerance: Patient  tolerated treatment well Patient left: in chair;with call bell/phone within reach;with family/visitor present  OT Visit Diagnosis: Unsteadiness on feet (R26.81);Pain Pain - part of body:  (back)                Time: 7939-0300 OT Time Calculation (min): 24 min Charges:  OT General Charges $OT Visit: 1 Procedure OT Evaluation $OT Eval Moderate Complexity: 1 Procedure OT Treatments $Self Care/Home Management : 8-22 mins [place], OTR/L [phone] 01/05/2017

## 2017-01-05 NOTE — Discharge Instructions (Signed)

## 2017-01-05 NOTE — Progress Notes (Signed)
Physical Therapy Treatment and Discharge Patient Details Name: Edward [name] MRN: [id number] DOB: 17-Jun-1951 Today's Date: 01/05/2017    History of Present Illness Pt is a 66 y/o male s/p L4-S1 PLIF. PMH includes skin cancer, R TKA, and cervical fusion.     PT Comments    Pt progressing well with functional mobility. Pt has met PT goals and no longer requires acute PT services. Pt and sister were educated on appropriate AD use ([place] if needed - pt's sister insistent that he needs a walker but was at a supervision to mod I level without an AD). They were also educated on car transfer, precautions, brace application and wearing schedule, and appropriate activity progression. Will sign off at this time. If needs change, please reconsult.    Follow Up Recommendations  No PT follow up;Supervision/Assistance - 24 hour     Equipment Recommendations  None recommended by PT    Recommendations for Other Services       Precautions / Restrictions Precautions Precautions: Back Precaution Booklet Issued: Yes (comment) Precaution Comments: Reviewed back precautions with pt.  Required Braces or Orthoses: Spinal Brace Spinal Brace: Lumbar corset;Applied in sitting position Restrictions Weight Bearing Restrictions: No    Mobility  Bed Mobility Overal bed mobility: Modified Independent Bed Mobility: Rolling;Sidelying to Sit           General bed mobility comments: Pt able to transition to/from EOB without assistance. HOB flat and rails lowered to simulate home environment.   Transfers Overall transfer level: Needs assistance Equipment used: None Transfers: Sit to/from Stand Sit to Stand: Supervision         General transfer comment: Supervision for safety without an AD. With a RW, pt at a mod I level.   Ambulation/Gait Ambulation/Gait assistance: Supervision;Modified independent (Device/Increase time) Ambulation Distance (Feet): 400 Feet Assistive device: Rolling walker (2  wheeled);None Gait Pattern/deviations: Step-through pattern;Decreased weight shift to right;Trunk flexed Gait velocity: Decreased Gait velocity interpretation: Below normal speed for age/gender General Gait Details: VC's for improved posture. Pt was able to ambulate without the RW once in hall and did not require UE support on railings. Pt carrying on normal conversation while ambulating and did not note any balance disturbances. Initially supervision progressing to a mod I level.    Stairs            Wheelchair Mobility    Modified Rankin (Stroke Patients Only)       Balance Overall balance assessment: Needs assistance Sitting-balance support: No upper extremity supported;Feet supported Sitting balance-Leahy Scale: Good     Standing balance support: Bilateral upper extremity supported;During functional activity Standing balance-Leahy Scale: Poor                              Cognition Arousal/Alertness: Awake/alert Behavior During Therapy: WFL for tasks assessed/performed Overall Cognitive Status: Within Functional Limits for tasks assessed                                        Exercises      General Comments        Pertinent Vitals/Pain Pain Assessment: 0-10 Pain Score: 3  Pain Location: back  Pain Descriptors / Indicators: Sore Pain Intervention(s): Limited activity within patient's tolerance    Home Living Family/patient expects to be discharged to:: Private residence Living Arrangements: Other (Comment) (will be staying  with sister) Available Help at Discharge: Family;Available 24 hours/day Type of Home: House     Home Layout: One level Home Equipment: Cane - single point;Walker - 2 wheels;Shower seat - built in;Hand held shower head;Grab bars - tub/shower Additional Comments: pt going to sister's at D/C    Prior Function Level of Independence: Independent          PT Goals (current goals can now be found in the care  plan section) Acute Rehab PT Goals Patient Stated Goal: home today PT Goal Formulation: With patient Time For Goal Achievement: 01/11/17 Potential to Achieve Goals: Good Progress towards PT goals: Progressing toward goals    Frequency    Min 5X/week      PT Plan Current plan remains appropriate    Co-evaluation              AM-PAC PT "6 Clicks" Daily Activity  Outcome Measure  Difficulty turning over in bed (including adjusting bedclothes, sheets and blankets)?: A Little Difficulty moving from lying on back to sitting on the side of the bed? : Total Difficulty sitting down on and standing up from a chair with arms (e.g., wheelchair, bedside commode, etc,.)?: Total Help needed moving to and from a bed to chair (including a wheelchair)?: A Little Help needed walking in hospital room?: A Little Help needed climbing 3-5 steps with a railing? : A Lot 6 Click Score: 13    End of Session Equipment Utilized During Treatment: Gait belt;Back brace Activity Tolerance: Patient tolerated treatment well Patient left: in bed;with call bell/phone within reach Nurse Communication: Mobility status PT Visit Diagnosis: Other abnormalities of gait and mobility (R26.89);Pain Pain - part of body:  (back)     Time: 1884-1660 PT Time Calculation (min) (ACUTE ONLY): 21 min  Charges:  $Gait Training: 8-22 mins                    G Codes:       Edward [name], PT, DPT Acute Rehabilitation Services Pager: [phone]    Edward [name] 01/05/2017, 10:51 AM

## 2017-01-05 NOTE — Progress Notes (Signed)
Patient alert and oriented, mae's well, voiding adequate amount of urine, swallowing without difficulty, c/o mild pain and meds given prior to discharged for ride and discomfort. Patient discharged home with family. Script and discharged instructions given to patient. Patient and family stated understanding of instructions given.

## 2017-01-23 ENCOUNTER — Other Ambulatory Visit: Payer: Self-pay | Admitting: Interventional Cardiology

## 2017-01-23 MED ORDER — NITROGLYCERIN 0.4 MG SL SUBL: 0.4000 mg | SUBLINGUAL_TABLET | SUBLINGUAL | 0 refills | Status: DC | PRN

## 2017-04-09 ENCOUNTER — Ambulatory Visit (INDEPENDENT_AMBULATORY_CARE_PROVIDER_SITE_OTHER): Payer: Medicare Other | Admitting: Interventional Cardiology

## 2017-04-09 ENCOUNTER — Encounter: Payer: Self-pay | Admitting: Interventional Cardiology

## 2017-04-09 VITALS — BP 118/64 | HR 62 | Ht 69.0 in | Wt 191.1 lb

## 2017-04-09 DIAGNOSIS — E7849 Other hyperlipidemia: Secondary | ICD-10-CM | POA: Diagnosis not present

## 2017-04-09 DIAGNOSIS — I25709 Atherosclerosis of coronary artery bypass graft(s), unspecified, with unspecified angina pectoris: Secondary | ICD-10-CM

## 2017-04-09 DIAGNOSIS — I10 Essential (primary) hypertension: Secondary | ICD-10-CM

## 2017-04-09 MED ORDER — ASPIRIN EC 81 MG PO TBEC: 81.0000 mg | DELAYED_RELEASE_TABLET | Freq: Every day | ORAL | 3 refills | Status: AC

## 2017-04-09 NOTE — Patient Instructions (Signed)
Medication Instructions:  1) DECREASE Aspirin to 81mg  once daily  Labwork: None  Testing/Procedures: Your physician has requested that you have a lexiscan myoview prior to the end of the year. For further information please visit [email or web address]. Please follow instruction sheet, as given.    Follow-Up: Your physician wants you to follow-up in: 1 year with Dr. Tamala Julian.  You will receive a reminder letter in the mail two months in advance. If you don't receive a letter, please call our office to schedule the follow-up appointment.   Any Other Special Instructions Will Be Listed Below (If Applicable).     If you need a refill on your cardiac medications before your next appointment, please call your pharmacy.

## 2017-04-09 NOTE — Progress Notes (Signed)
Cardiology Office Note    Date:  04/09/2017   ID:  CALOB BASKETTE, DOB 1951-05-18, MRN [id number]  PCP:  [name], MD  Cardiologist: [name], MD   Chief Complaint  Patient presents with  . Coronary Artery Disease    History of Present Illness:  Edward [name] is a 66 y.o. male a history of coronary artery disease with last cath 2010 with nonobstructive disease., gastroesophageal reflux, hyperlipidemia, and hypertension.  He feels episodes of angina more intense when they occur. These episodes occur once or twice per month and require multiple nitroglycerin tablets to relieve. The angina is variable threshold sometimes occurring at rest and other times with activity. He denies palpitations, syncope, dizziness, dyspnea, and radiation of discomfort. Quality of discomfort is similar to prior past. Last ischemic evaluation was prior to knee surgery in October 2017. Otherwise the patient is doing well. He walks frequently and gets no predictable episodes of angina.    Past Medical History:  Diagnosis Date  . Anginal pain ([place])    last NTG 3-4 months ago  . Anxiety    claustrophobic  . Arthritis    throughout arms, knees & back & neck  . CAD (coronary artery disease)    with high grade obstruction second obtuse marginal and diffuse LAD and circumflex disease by cath 2010  . Cancer ([place])    skin cancer, on R ear - basal cell   . Cold sore    right upper lip, healing well  . Colon polyp   . Elevated cholesterol    LDL Goal < 70  . Erectile dysfunction   . GERD (gastroesophageal reflux disease)    has not had problem several months  . History of nonmelanoma skin cancer   . History of nuclear stress test 5/10   no ischemia  . Hypertension     Past Surgical History:  Procedure Laterality Date  . ANTERIOR CERVICAL DECOMP/DISCECTOMY FUSION N/A 05/29/2013   Procedure: Cervical five-six Anterior cervical decompression/diskectomy/fusion with exploration of Cervical  six-seven, hardware removal ;  Surgeon: Erline Levine, MD;  Location: [facility];  Service: Neurosurgery;  Laterality: N/A;  Cervical five-six Anterior cervical decompression/diskectomy/fusion with exploration of Cervical six-seven, Hardware removal   . BACK SURGERY     lower  . CARDIAC CATHETERIZATION     2004 & 2/10, normal LV function  . CERVICAL FUSION    . COLONOSCOPY WITH PROPOFOL N/A 05/14/2014   Procedure: COLONOSCOPY WITH PROPOFOL;  Surgeon: Garlan Fair, MD;  Location: [facility];  Service: Endoscopy;  Laterality: N/A;  . FINGER FRACTURE SURGERY     by Dr. Daylene Katayama  . I&D KNEE WITH POLY EXCHANGE Right 05/08/2016   Procedure: POLY EXCHANGE;  Surgeon: Paralee Cancel, MD;  Location: [facility];  Service: Orthopedics;  Laterality: Right;  . KNEE ARTHROSCOPY  06/13/2011   Procedure: ARTHROSCOPY KNEE;  Surgeon: Tobi Bastos;  Location: [facility];  Service: Orthopedics;  Laterality: Right;  Right Knee Arthroscopy with Medial Menisectomy  . MAXIMUM ACCESS (MAS)POSTERIOR LUMBAR INTERBODY FUSION (PLIF) 2 LEVEL N/A 01/04/2017   Procedure: Lumbar four- five Lumbar five-Sacral one Maximum access posterior lumbar interbody fusion;  Surgeon: Erline Levine, MD;  Location: [facility];  Service: Neurosurgery;  Laterality: N/A;  Lumbar four- five Lumbar five-Sacral one Maximum access posterior lumbar interbody fusion  . NECK SURGERY  05/29/2013  . SCAR DEBRIDEMENT OF TOTAL KNEE Right 05/08/2016   Procedure: SCAR DEBRIDEMENT OF TOTAL KNEE, EXCISION OF SAPHENOUS  NEUROMA;  Surgeon: Paralee Cancel, MD;  Location: [facility];  Service: Orthopedics;  Laterality: Right;  . SHOULDER SURGERY     2 left / x3 right  . SKIN CANCER EXCISION    . SKIN SURGERY     nose and right ear skin cancer surgery  . TOTAL KNEE ARTHROPLASTY Right 03/30/2015   Procedure: RIGHT TOTAL KNEE ARTHROPLASTY;  Surgeon: Latanya Maudlin, MD;  Location: [facility];  Service: Orthopedics;  Laterality: Right;    Current Medications: Outpatient Medications  Prior to Visit  Medication Sig Dispense Refill  . ALPRAZolam (XANAX) 0.5 MG tablet Take 0.5 mg by mouth at bedtime as needed for sleep.     Marland Kitchen HYDROcodone-acetaminophen (NORCO/VICODIN) 5-325 MG tablet Take 1 tablet by mouth every 8 (eight) hours as needed for pain.    Marland Kitchen losartan (COZAAR) 100 MG tablet Take 50 mg by mouth every evening.     Marland Kitchen LYRICA 75 MG capsule Take 75 mg by mouth at bedtime.    . nitroGLYCERIN (NITROSTAT) 0.4 MG SL tablet Place 1 tablet (0.4 mg total) under the tongue every 5 (five) minutes as needed for chest pain. 75 tablet 0  . pantoprazole (PROTONIX) 40 MG tablet Take 40 mg by mouth daily as needed (for acid reflux).     . rosuvastatin (CRESTOR) 20 MG tablet TAKE 1 TABLET BY MOUTH  DAILY 90 tablet 1  . tamsulosin (FLOMAX) 0.4 MG CAPS capsule Take 0.4 mg by mouth daily.     Marland Kitchen aspirin 325 MG tablet Take 325 mg by mouth at bedtime.    . docusate sodium (COLACE) 100 MG capsule Take 1 capsule (100 mg total) by mouth 2 (two) times daily. (Patient not taking: Reported on 04/09/2017) 10 capsule 0  . ferrous sulfate 325 (65 FE) MG tablet Take 1 tablet (325 mg total) by mouth 3 (three) times daily after meals. (Patient not taking: Reported on 04/09/2017)  3  . HYDROcodone-acetaminophen (NORCO) 10-325 MG tablet Take 1-2 tablets by mouth every 4 (four) hours as needed. (Patient not taking: Reported on 04/09/2017) 80 tablet 0  . methocarbamol (ROBAXIN) 500 MG tablet Take 1 tablet (500 mg total) by mouth every 6 (six) hours as needed for muscle spasms. (Patient not taking: Reported on 04/09/2017) 60 tablet 1  . polyethylene glycol (MIRALAX / GLYCOLAX) packet Take 17 g by mouth 2 (two) times daily. (Patient not taking: Reported on 04/09/2017) 14 each 0   No facility-administered medications prior to visit.      Allergies:   Nsaids and Percocet [oxycodone-acetaminophen]   Social History   Social History  . Marital status: Divorced    Spouse name: N/A  . Number of children: N/A  . Years  of education: N/A   Social History Main Topics  . Smoking status: Former Smoker    Quit date: 07/10/1998  . Smokeless tobacco: Never Used  . Alcohol use No  . Drug use: No  . Sexual activity: Not Asked   Other Topics Concern  . None   Social History Narrative  . None     Family History:  The patient's family history includes CAD in his sister and sister; CVA in his sister; Heart attack in his mother and sister.   ROS:   Please see the history of present illness.    Dyspnea with activity, fatigue which is unexplained, dizziness, and chronic right knee pain status post replacement 2016 and revision 2017. Is also proximally 6 months status post L4-L5 and L5-S1 fusion by  Dr. Erline Levine.  All other systems reviewed and are negative.   PHYSICAL EXAM:   VS:  BP 118/64   Pulse 62   Ht 5\' 9"  (1.753 m)   Wt 191 lb 1.9 oz (86.7 kg)   SpO2 97%   BMI 28.22 kg/m    GEN: Well nourished, well developed, in no acute distress  HEENT: normal  Neck: no JVD, carotid bruits, or masses Cardiac: RRR; no murmurs, rubs, or gallops,no edema  Respiratory:  clear to auscultation bilaterally, normal work of breathing GI: soft, nontender, nondistended, + BS MS: no deformity or atrophy  Skin: warm and dry, no rash Neuro:  Alert and Oriented x 3, Strength and sensation are intact Psych: euthymic mood, full affect  Wt Readings from Last 3 Encounters:  04/09/17 191 lb 1.9 oz (86.7 kg)  01/04/17 193 lb (87.5 kg)  01/02/17 193 lb (87.5 kg)      Studies/Labs Reviewed:   EKG:  EKG  NOT REPEATED  Recent Labs: 01/02/2017: BUN 8; Creatinine, Ser 0.77; Hemoglobin 14.8; Platelets 135; Potassium 3.8; Sodium 138   Lipid Panel    Component Value Date/Time   CHOL 137 07/09/2015 0856   TRIG 95 07/09/2015 0856   HDL 47 07/09/2015 0856   CHOLHDL 2.9 07/09/2015 0856   VLDL 19 07/09/2015 0856   LDLCALC 71 07/09/2015 0856    Additional studies/ records that were reviewed today include:  STRESS TEST  04/12/2016: Study Highlights     Nuclear stress EF: 56%.  There was no ST segment deviation noted during stress.  Defect 1: There is a small defect of mild severity present in the apex location.  This is a low risk study.  The left ventricular ejection fraction is normal (55-65%).   Low risk stress nuclear study with probable apical thinning and no ischemia (minimal reversibility at apex felt not to be significant); EF 56; normal wall motion.       ASSESSMENT:    1. 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 Essential hypertension, benign   3. Other hyperlipidemia      PLAN:  In order of problems listed above:  1. More severe angina when it occurs, requiring multiple nitroglycerin tablets to relieve. No recent coronary angiogram. Low risk nuclear study greater than a year ago. He did have an apical perfusion defect on that study that was not felt to show evidence of ischemia. We'll plan to repeat a pharmacologic stress test to exclude the possibility of worsening. 2. Blood pressure is excellently controlled. Low salt diet, exercise, and weight control or advocated. 3. LDL was 72 when last evaluated in September 2018. He is at goal.  We will repeat his nuclear study to exclude    Medication Adjustments/Labs and Tests Ordered: Current medicines are reviewed at length with the patient today.  Concerns regarding medicines are outlined above.  Medication changes, Labs and Tests ordered today are listed in the Patient Instructions below. Patient Instructions  Medication Instructions:  1) DECREASE Aspirin to 81mg  once daily  Labwork: None  Testing/Procedures: Your physician has requested that you have a lexiscan myoview prior to the end of the year. For further information please visit [email or web address]. Please follow instruction sheet, as given.    Follow-Up: Your physician wants you to follow-up in: 1 year  with Dr. Tamala Julian.  You will receive a reminder letter in the mail two months in advance. If you don't receive a letter, please  call our office to schedule the follow-up appointment.   Any Other Special Instructions Will Be Listed Below (If Applicable).     If you need a refill on your cardiac medications before your next appointment, please call your pharmacy.      Signed, [name], MD  04/09/2017 8:32 AM    [facility] [place], [place], [place]  [address] Phone: [phone]; Fax: [phone]

## 2017-04-10 ENCOUNTER — Telehealth (HOSPITAL_COMMUNITY): Payer: Self-pay | Admitting: *Deleted

## 2017-04-10 NOTE — Telephone Encounter (Signed)
Patient given detailed instructions per Myocardial Perfusion Study Information Sheet for the test on 04/13/17. Patient notified to arrive 15 minutes early and that it is imperative to arrive on time for appointment to keep from having the test rescheduled.  If you need to cancel or reschedule your appointment, please call the office within 24 hours of your appointment. . Patient verbalized understanding. Kirstie Peri

## 2017-04-13 ENCOUNTER — Ambulatory Visit (HOSPITAL_COMMUNITY): Payer: Medicare Other | Attending: Cardiovascular Disease

## 2017-04-13 DIAGNOSIS — R079 Chest pain, unspecified: Secondary | ICD-10-CM | POA: Insufficient documentation

## 2017-04-13 DIAGNOSIS — I25709 Atherosclerosis of coronary artery bypass graft(s), unspecified, with unspecified angina pectoris: Secondary | ICD-10-CM

## 2017-04-13 LAB — MYOCARDIAL PERFUSION IMAGING
CHL CUP NUCLEAR SDS: 0
CHL CUP NUCLEAR SSS: 0
CHL CUP RESTING HR STRESS: 60 {beats}/min
LHR: 0.33
LV dias vol: 114 mL (ref 62–150)
LVSYSVOL: 50 mL
Peak HR: 75 {beats}/min
SRS: 0
TID: 0.92

## 2017-04-13 MED ORDER — TECHNETIUM TC 99M TETROFOSMIN IV KIT
10.6000 | PACK | Freq: Once | INTRAVENOUS | Status: AC | PRN
  Administered 2017-04-13: 10.6 via INTRAVENOUS
  Filled 2017-04-13: qty 11

## 2017-04-13 MED ORDER — TECHNETIUM TC 99M TETROFOSMIN IV KIT
32.1000 | PACK | Freq: Once | INTRAVENOUS | Status: AC | PRN
  Administered 2017-04-13: 32.1 via INTRAVENOUS
  Filled 2017-04-13: qty 33

## 2017-04-13 MED ORDER — REGADENOSON 0.4 MG/5ML IV SOLN
0.4000 mg | Freq: Once | INTRAVENOUS | Status: AC
Start: 2017-04-13 — End: 2017-04-13
  Administered 2017-04-13: 0.4 mg via INTRAVENOUS

## 2017-07-12 DIAGNOSIS — M545 Low back pain, unspecified: Secondary | ICD-10-CM | POA: Insufficient documentation

## 2017-07-12 DIAGNOSIS — M5136 Other intervertebral disc degeneration, lumbar region: Secondary | ICD-10-CM | POA: Insufficient documentation

## 2017-07-17 ENCOUNTER — Other Ambulatory Visit: Payer: Self-pay

## 2017-07-17 ENCOUNTER — Emergency Department (HOSPITAL_COMMUNITY): Payer: Medicare Other

## 2017-07-17 ENCOUNTER — Encounter (HOSPITAL_COMMUNITY): Payer: Self-pay | Admitting: Emergency Medicine

## 2017-07-17 DIAGNOSIS — R0789 Other chest pain: Secondary | ICD-10-CM | POA: Diagnosis present

## 2017-07-17 DIAGNOSIS — Z7982 Long term (current) use of aspirin: Secondary | ICD-10-CM | POA: Diagnosis not present

## 2017-07-17 DIAGNOSIS — Z79899 Other long term (current) drug therapy: Secondary | ICD-10-CM | POA: Diagnosis not present

## 2017-07-17 DIAGNOSIS — Z96651 Presence of right artificial knee joint: Secondary | ICD-10-CM | POA: Insufficient documentation

## 2017-07-17 DIAGNOSIS — Z87891 Personal history of nicotine dependence: Secondary | ICD-10-CM | POA: Insufficient documentation

## 2017-07-17 DIAGNOSIS — Z85828 Personal history of other malignant neoplasm of skin: Secondary | ICD-10-CM | POA: Diagnosis not present

## 2017-07-17 DIAGNOSIS — I10 Essential (primary) hypertension: Secondary | ICD-10-CM | POA: Diagnosis not present

## 2017-07-17 DIAGNOSIS — I2510 Atherosclerotic heart disease of native coronary artery without angina pectoris: Secondary | ICD-10-CM | POA: Insufficient documentation

## 2017-07-17 LAB — BASIC METABOLIC PANEL
Anion gap: 10 (ref 5–15)
BUN: 8 mg/dL (ref 6–20)
CHLORIDE: 104 mmol/L (ref 101–111)
CO2: 24 mmol/L (ref 22–32)
CREATININE: 0.82 mg/dL (ref 0.61–1.24)
Calcium: 8.8 mg/dL — ABNORMAL LOW (ref 8.9–10.3)
GFR calc Af Amer: >60 mL/min (ref >60)
GFR calc non Af Amer: >60 mL/min (ref >60)
Glucose, Bld: 98 mg/dL (ref 65–99)
POTASSIUM: 3.9 mmol/L (ref 3.5–5.1)
Sodium: 138 mmol/L (ref 135–145)

## 2017-07-17 LAB — CBC
HEMATOCRIT: 38.8 % — AB (ref 39.0–52.0)
Hemoglobin: 13 g/dL (ref 13.0–17.0)
MCH: 29 pg (ref 26.0–34.0)
MCHC: 33.5 g/dL (ref 30.0–36.0)
MCV: 86.4 fL (ref 78.0–100.0)
PLATELETS: 164 10*3/uL (ref 150–400)
RBC: 4.49 MIL/uL (ref 4.22–5.81)
RDW: 12.8 % (ref 11.5–15.5)
WBC: 7.7 10*3/uL (ref 4.0–10.5)

## 2017-07-17 LAB — I-STAT TROPONIN, ED
Troponin i, poc: 0 ng/mL (ref 0.00–0.08)
Troponin i, poc: 0 ng/mL (ref 0.00–0.08)

## 2017-07-17 NOTE — ED Notes (Signed)
Patient updated on delays.  PATIENT IS HARD OF HEARING.  WEARING A BROWN HAT

## 2017-07-17 NOTE — ED Triage Notes (Signed)
Pt c/o chest pain and left arm numbness after walking a mile. Reports taking 1 NTG with mild relief. Also states that he has been having to take NTG more frequently, hx of blockages, no stents.

## 2017-07-17 NOTE — ED Provider Notes (Signed)
Patient placed in Quick Look pathway, seen and evaluated for chief complaint of chest pain with exertion PTA around 1300. Pertinent H&P findings include nausea and numbness. CP improved with NTG. Minimal pain currently. [place] ACS. Noted ACS.  Based on initial evaluation, labs are indicated and radiology studies are indicated.  Patient counseled on process, plan, and necessity for staying for completing the evaluation.    Shary Decamp, PA-C 07/17/17 1557    Pattricia Boss, MD 07/18/17 1550

## 2017-07-18 ENCOUNTER — Emergency Department (HOSPITAL_COMMUNITY)
Admission: EM | Admit: 2017-07-18 | Discharge: 2017-07-18 | Disposition: A | Discharge status: Home / Self Care | Payer: Medicare Other | Attending: Emergency Medicine | Admitting: Emergency Medicine

## 2017-07-18 DIAGNOSIS — R079 Chest pain, unspecified: Secondary | ICD-10-CM

## 2017-07-18 NOTE — ED Notes (Signed)
ED Provider at bedside. 

## 2017-07-18 NOTE — ED Provider Notes (Signed)
[facility] EMERGENCY DEPARTMENT Provider Note   CSN: [id number] Arrival date & time: 07/17/17  1548     History   Chief Complaint Chief Complaint  Patient presents with  . Chest Pain    HPI Edward [name] is a 67 y.o. male.  Patient with PMH of CAD and angina presents to the ED with a chief complaint of chest pain.  He states that he was out on a walk with a friend today.  He states that as they were walking up a hill, he became winded.  He denies having any chest pain at that time.  He states that once he got back to his car, he had some throbbing in his temples, which he states felt like his BP was high.  He states that this resolved and then he had severe stabbing chest pain, which quickly receded to mild chest pain.  The pain has been present since noon yesterday.  He denies SOB or diaphoresis now.  He states that this felt unlike his typical angina.  He rates the pain as a 2/10 now.   The history is provided by the patient. No language interpreter was used.    Past Medical History:  Diagnosis Date  . Anginal pain ([place])    last NTG 3-4 months ago  . Anxiety    claustrophobic  . Arthritis    throughout arms, knees & back & neck  . CAD (coronary artery disease)    with high grade obstruction second obtuse marginal and diffuse LAD and circumflex disease by cath 2010  . Cancer ([place])    skin cancer, on R ear - basal cell   . Cold sore    right upper lip, healing well  . Colon polyp   . Elevated cholesterol    LDL Goal < 70  . Erectile dysfunction   . GERD (gastroesophageal reflux disease)    has not had problem several months  . History of nonmelanoma skin cancer   . History of nuclear stress test 5/10   no ischemia  . Hypertension     Patient Active Problem List   Diagnosis Date Noted  . Idiopathic scoliosis of lumbar region 01/04/2017  . S/P revision of total knee, right 05/08/2016  . History of total knee arthroplasty 03/30/2015  .  Hyperlipidemia 01/29/2014  . Coronary atherosclerosis of native coronary artery 04/18/2013    Class: Chronic  . Essential hypertension, benign 04/18/2013    Class: Chronic  . Essential hypertension 04/18/2013  . Meniscus, medial, bucket handle tear, old 06/13/2011    Past Surgical History:  Procedure Laterality Date  . ANTERIOR CERVICAL DECOMP/DISCECTOMY FUSION N/A 05/29/2013   Procedure: Cervical five-six Anterior cervical decompression/diskectomy/fusion with exploration of Cervical six-seven, hardware removal ;  Surgeon: Erline Levine, MD;  Location: [facility];  Service: Neurosurgery;  Laterality: N/A;  Cervical five-six Anterior cervical decompression/diskectomy/fusion with exploration of Cervical six-seven, Hardware removal   . BACK SURGERY     lower  . CARDIAC CATHETERIZATION     2004 & 2/10, normal LV function  . CERVICAL FUSION    . COLONOSCOPY WITH PROPOFOL N/A 05/14/2014   Procedure: COLONOSCOPY WITH PROPOFOL;  Surgeon: Garlan Fair, MD;  Location: [facility];  Service: Endoscopy;  Laterality: N/A;  . FINGER FRACTURE SURGERY     by Dr. Daylene Katayama  . I&D KNEE WITH POLY EXCHANGE Right 05/08/2016   Procedure: POLY EXCHANGE;  Surgeon: Paralee Cancel, MD;  Location: [facility];  Service: Orthopedics;  Laterality: Right;  . KNEE ARTHROSCOPY  06/13/2011   Procedure: ARTHROSCOPY KNEE;  Surgeon: Tobi Bastos;  Location: [facility];  Service: Orthopedics;  Laterality: Right;  Right Knee Arthroscopy with Medial Menisectomy  . MAXIMUM ACCESS (MAS)POSTERIOR LUMBAR INTERBODY FUSION (PLIF) 2 LEVEL N/A 01/04/2017   Procedure: Lumbar four- five Lumbar five-Sacral one Maximum access posterior lumbar interbody fusion;  Surgeon: Erline Levine, MD;  Location: [facility];  Service: Neurosurgery;  Laterality: N/A;  Lumbar four- five Lumbar five-Sacral one Maximum access posterior lumbar interbody fusion  . NECK SURGERY  05/29/2013  . SCAR DEBRIDEMENT OF TOTAL KNEE Right 05/08/2016   Procedure: SCAR  DEBRIDEMENT OF TOTAL KNEE, EXCISION OF SAPHENOUS NEUROMA;  Surgeon: Paralee Cancel, MD;  Location: [facility];  Service: Orthopedics;  Laterality: Right;  . SHOULDER SURGERY     2 left / x3 right  . SKIN CANCER EXCISION    . SKIN SURGERY     nose and right ear skin cancer surgery  . TOTAL KNEE ARTHROPLASTY Right 03/30/2015   Procedure: RIGHT TOTAL KNEE ARTHROPLASTY;  Surgeon: Latanya Maudlin, MD;  [facility]: [facility];  Service: Orthopedics;  Laterality: Right;       Home Medications    Prior to Admission medications   Medication Sig Start Date End Date Taking? Authorizing Provider  ALPRAZolam Duanne Moron) 0.5 MG tablet Take 0.5 mg by mouth at bedtime as needed for sleep.     [provider]  aspirin EC 81 MG tablet Take 1 tablet (81 mg total) by mouth daily. 04/09/17   Belva Crome, MD  HYDROcodone-acetaminophen (NORCO/VICODIN) 5-325 MG tablet Take 1 tablet by mouth every 8 (eight) hours as needed for pain. 12/05/16   [provider]  losartan (COZAAR) 100 MG tablet Take 50 mg by mouth every evening.     [provider]  LYRICA 75 MG capsule Take 75 mg by mouth at bedtime. 11/03/16   [provider]  nitroGLYCERIN (NITROSTAT) 0.4 MG SL tablet Place 1 tablet (0.4 mg total) under the tongue every 5 (five) minutes as needed for chest pain. 01/23/17   Belva Crome, MD  pantoprazole (PROTONIX) 40 MG tablet Take 40 mg by mouth daily as needed (for acid reflux).     [provider]  rosuvastatin (CRESTOR) 20 MG tablet TAKE 1 TABLET BY MOUTH  DAILY 12/22/16   Belva Crome, MD  tamsulosin (FLOMAX) 0.4 MG CAPS capsule Take 0.4 mg by mouth daily.  03/28/16   [provider]    Family History Family History  Problem Relation Age of Onset  . Heart attack Mother   . Heart attack Sister   . CAD Sister   . CAD Sister        stents  . CVA Sister     Social History Social History   Tobacco Use  . Smoking status: Former Smoker    Last attempt to quit:  07/10/1998    Years since quitting: 19.0  . Smokeless tobacco: Never Used  Substance Use Topics  . Alcohol use: No  . Drug use: No     Allergies   Nsaids and Percocet [oxycodone-acetaminophen]   Review of Systems Review of Systems  All other systems reviewed and are negative.    Physical Exam Updated Vital Signs BP 134/68 (BP Location: Right Arm)   Pulse 66   Temp 98.2 F (36.8 C)   Resp 18   SpO2 96%   Physical Exam  Constitutional: He is oriented to person,  place, and time. He appears well-developed and well-nourished.  HENT:  Head: Normocephalic and atraumatic.  Eyes: Conjunctivae and EOM are normal. Pupils are equal, round, and reactive to light. Right eye exhibits no discharge. Left eye exhibits no discharge. No scleral icterus.  Neck: Normal range of motion. Neck supple. No JVD present.  Cardiovascular: Normal rate, regular rhythm and normal heart sounds. Exam reveals no gallop and no friction rub.  No murmur heard. Pulmonary/Chest: Effort normal and breath sounds normal. No respiratory distress. He has no wheezes. He has no rales. He exhibits no tenderness.  Abdominal: Soft. He exhibits no distension and no mass. There is no tenderness. There is no rebound and no guarding.  Musculoskeletal: Normal range of motion. He exhibits no edema or tenderness.  Neurological: He is alert and oriented to person, place, and time.  Skin: Skin is warm and dry.  Psychiatric: He has a normal mood and affect. His behavior is normal. Judgment and thought content normal.  Nursing note and vitals reviewed.    ED Treatments / Results  Labs (all labs ordered are listed, but only abnormal results are displayed) Labs Reviewed  BASIC METABOLIC PANEL - Abnormal; Notable for the following components:      Result Value   Calcium 8.8 (*)    All other components within normal limits  CBC - Abnormal; Notable for the following components:   HCT 38.8 (*)    All other components within  normal limits  I-STAT TROPONIN, ED  I-STAT TROPONIN, ED    EKG  EKG Interpretation  Date/Time:  Tuesday July 17 2017 15:55:53 EST Ventricular Rate:  76 PR Interval:  130 QRS Duration: 80 QT Interval:  362 QTC Calculation: 407 R Axis:   11 Text Interpretation:  Normal sinus rhythm Low voltage QRS Borderline ECG No significant change was found Confirmed by Jola Schmidt [phone]) on 07/18/2017 12:13:50 AM        Radiology Dg Chest 2 View  Result Date: 07/17/2017 CLINICAL DATA:  Chest pain dizziness and nausea today. EXAM: CHEST  2 VIEW COMPARISON:  Chest x-ray 06/06/2015 FINDINGS: The cardiac silhouette, mediastinal and contours are within limits and stable. The lungs are clear. No pleural effusion. The bony thorax is intact. IMPRESSION: No acute cardiopulmonary findings. Electronically Signed   By: Marijo Sanes M.D.   On: 07/17/2017 16:38    Procedures Procedures (including critical care time)  Medications Ordered in ED Medications - No data to display   Initial Impression / Assessment and Plan / ED Course  I have reviewed the triage vital signs and the nursing notes.  Pertinent labs & imaging results that were available during my care of the patient were reviewed by me and considered in my medical decision making (see chart for details).     Patient presents with chest pain that started more than 12 hours ago.  It has been constant, but much less severe than when it started.  He denies SOB.  DDx includes ACS, PE, pneumothorax, aortic dissection, esophageal rupture, pericarditis, chest wall pain.  Doubt ACS, normal troponin, no ischemic EKG findings, HEART score is: 3.  Low risk for PE, Well's PE score is 0, patient is not tachycardic nor hypoxic.  No evidence of pneumothorax on CXR.  Doubt dissection, no mediastinal widening on CXR, no ripping/tearing chest pain, neurovascularly intact.  Doubt pericarditis, no positional changes, or diffuse ST elevations on EKG.  Pain is  not reproducible, doubt MSK.  Delta troponin is negative.  Patient seen  by and discussed with Dr. Venora Maples, who agrees that if the pain were ACS, we should have seen troponin elevation or EKG changes by now.  Recommends repeat EKG, which if unchanged, then the patient can be discharged and non-cardiac causes of chest pain can be further worked up by his PCP.  Repeat EKG reviewed with Dr. Venora Maples, who agrees that there are no dynamic changes and agrees that the patient can be discharged.  All findings were discussed with patient.  Patient understands and agrees with the plan.     Final Clinical Impressions(s) / ED Diagnoses   Final diagnoses:  Nonspecific chest pain    ED Discharge Orders    None       Montine Circle, PA-C 07/18/17 0248    Jola Schmidt, MD 07/18/17 [phone]

## 2017-08-24 ENCOUNTER — Telehealth: Payer: Self-pay | Admitting: Interventional Cardiology

## 2017-08-24 NOTE — Telephone Encounter (Signed)
Edward [name] is calling because he has been very tired lately , feels like he is about to pass out , may have pains in the chest takes a nitro and it goes away . Get tired real easy . The chest pais are on the left hand side . Went to ER back in January and they found nothing . He is just not feeling well.   He is schedule to come in on March 5 to [facility] Please call

## 2017-08-24 NOTE — Telephone Encounter (Signed)
Patient said he feels dizzy and feels like he is going to pass out when he stands from a sitting positon or from bending position. Patient said he feels pressure in his head, and gets a headache everyday. Patient said these symptoms have been going on for 2-3 weeks. Patient takes hydrocodone for back pain and said it helps the headache a little. No c/o chest pain, dizziness or sob. Patient has an appointment with our office on 09/11/17. Patient instructed to contact his PCP and advised that if his symptoms get worse, that he needed to go to the ED for an evaluation. Patient verbalized understanding of plan.

## 2017-08-26 NOTE — Telephone Encounter (Signed)
Agree PCP.

## 2017-08-29 ENCOUNTER — Other Ambulatory Visit: Payer: Self-pay | Admitting: Interventional Cardiology

## 2017-09-11 ENCOUNTER — Ambulatory Visit: Payer: Medicare Other | Admitting: Nurse Practitioner

## 2017-09-24 ENCOUNTER — Other Ambulatory Visit: Payer: Self-pay | Admitting: Interventional Cardiology

## 2017-09-24 MED ORDER — NITROGLYCERIN 0.4 MG SL SUBL: 0.4000 mg | SUBLINGUAL_TABLET | SUBLINGUAL | 0 refills | Status: DC | PRN

## 2017-10-02 ENCOUNTER — Ambulatory Visit: Payer: Medicare Other | Admitting: Nurse Practitioner

## 2017-10-02 ENCOUNTER — Encounter: Payer: Self-pay | Admitting: Nurse Practitioner

## 2017-10-02 VITALS — BP 120/62 | HR 62 | Ht 69.0 in | Wt 192.1 lb

## 2017-10-02 DIAGNOSIS — I25709 Atherosclerosis of coronary artery bypass graft(s), unspecified, with unspecified angina pectoris: Secondary | ICD-10-CM

## 2017-10-02 DIAGNOSIS — E7849 Other hyperlipidemia: Secondary | ICD-10-CM | POA: Diagnosis not present

## 2017-10-02 DIAGNOSIS — I10 Essential (primary) hypertension: Secondary | ICD-10-CM | POA: Diagnosis not present

## 2017-10-02 LAB — CBC
Hematocrit: 38.8 % (ref 37.5–51.0)
Hemoglobin: 13.2 g/dL (ref 13.0–17.7)
MCH: 29.8 pg (ref 26.6–33.0)
MCHC: 34 g/dL (ref 31.5–35.7)
MCV: 88 fL (ref 79–97)
Platelets: 161 10*3/uL (ref 150–379)
RBC: 4.43 x10E6/uL (ref 4.14–5.80)
RDW: 13.4 % (ref 12.3–15.4)
WBC: 4.3 10*3/uL (ref 3.4–10.8)

## 2017-10-02 LAB — BASIC METABOLIC PANEL
BUN/Creatinine Ratio: 9 — ABNORMAL LOW (ref 10–24)
BUN: 7 mg/dL — ABNORMAL LOW (ref 8–27)
CO2: 25 mmol/L (ref 20–29)
Calcium: 8.8 mg/dL (ref 8.6–10.2)
Chloride: 107 mmol/L — ABNORMAL HIGH (ref 96–106)
Creatinine, Ser: 0.75 mg/dL — ABNORMAL LOW (ref 0.76–1.27)
GFR calc Af Amer: 111 mL/min/{1.73_m2} (ref >59)
GFR calc non Af Amer: 96 mL/min/{1.73_m2} (ref >59)
Glucose: 91 mg/dL (ref 65–99)
Potassium: 4.4 mmol/L (ref 3.5–5.2)
Sodium: 145 mmol/L — ABNORMAL HIGH (ref 134–144)

## 2017-10-02 LAB — PT AND PTT
INR: 1.1 (ref 0.8–1.2)
Prothrombin Time: 11.2 s (ref 9.1–12.0)
aPTT: 27 s (ref 24–33)

## 2017-10-02 MED ORDER — NITROGLYCERIN 0.4 MG SL SUBL: 0.4000 mg | SUBLINGUAL_TABLET | SUBLINGUAL | 3 refills | Status: DC | PRN

## 2017-10-02 MED ORDER — NITROGLYCERIN 0.4 MG SL SUBL: 0.4000 mg | SUBLINGUAL_TABLET | SUBLINGUAL | 6 refills | Status: DC | PRN

## 2017-10-02 NOTE — H&P (View-Only) (Signed)
CARDIOLOGY OFFICE NOTE  Date:  10/02/2017    Edward [name] Date of Birth: 1950/08/10 Medical Record #[id number]  PCP:  [name], MD  Cardiologist:  Tamala Julian  Chief Complaint  Patient presents with  . Coronary Artery Disease    6 month check - seen for Dr. Tamala Julian    History of Present Illness: Edward [name] is a 67 y.o. male who presents today for a 6 month check. Seen for Dr. Tamala Julian.   He has a history of coronary artery disease with last cath 2010 with nonobstructive disease, GERD, HLD and HTN.    Last seen back in October - noted more angina and was requiring NTG. Myoview was updated.   Comes in today. Here alone. He is off his ARB - was having orthostasis - this has improved somewhat with stopping the ARB. He continues to use NTG on occasion - ?more - sometimes he gets relief with just deep breathing but does use NTG. He was in the ER in early January with a "stabbing chest pain" - fleeting - negative evaluation with 2 negative troponins. This was unlike his typical angina but "scared" him. This has continued - still very fleeting. He notes that he continues to have his typical angina - he has had to use 3 NTG on occasion. FH is quite +. He admits he is quite scared. He has had chest pain with walking but typically his symptoms occur afterwards. He does get winded easily. He notes he is now ready to proceed on with cardiac catheterization to "see what is going on".   Past Medical History:  Diagnosis Date  . Anginal pain ([place])    last NTG 3-4 months ago  . Anxiety    claustrophobic  . Arthritis    throughout arms, knees & back & neck  . CAD (coronary artery disease)    with high grade obstruction second obtuse marginal and diffuse LAD and circumflex disease by cath 2010  . Cancer ([place])    skin cancer, on R ear - basal cell   . Cold sore    right upper lip, healing well  . Colon polyp   . Elevated cholesterol    LDL Goal < 70  . Erectile dysfunction   . GERD  (gastroesophageal reflux disease)    has not had problem several months  . History of nonmelanoma skin cancer   . History of nuclear stress test 5/10   no ischemia  . Hypertension     Past Surgical History:  Procedure Laterality Date  . ANTERIOR CERVICAL DECOMP/DISCECTOMY FUSION N/A 05/29/2013   Procedure: Cervical five-six Anterior cervical decompression/diskectomy/fusion with exploration of Cervical six-seven, hardware removal ;  Surgeon: Erline Levine, MD;  Location: [facility];  Service: Neurosurgery;  Laterality: N/A;  Cervical five-six Anterior cervical decompression/diskectomy/fusion with exploration of Cervical six-seven, Hardware removal   . BACK SURGERY     lower  . CARDIAC CATHETERIZATION     2004 & 2/10, normal LV function  . CERVICAL FUSION    . COLONOSCOPY WITH PROPOFOL N/A 05/14/2014   Procedure: COLONOSCOPY WITH PROPOFOL;  Surgeon: Garlan Fair, MD;  [facility];  Service: Endoscopy;  Laterality: N/A;  . FINGER FRACTURE SURGERY     by Dr. Daylene Katayama  . I&D KNEE WITH POLY EXCHANGE Right 05/08/2016   Procedure: POLY EXCHANGE;  Surgeon: Paralee Cancel, MD;  Location: [facility];  Service: Orthopedics;  Laterality: Right;  . KNEE ARTHROSCOPY  06/13/2011  Procedure: ARTHROSCOPY KNEE;  Surgeon: Tobi Bastos;  Location: [facility];  Service: Orthopedics;  Laterality: Right;  Right Knee Arthroscopy with Medial Menisectomy  . MAXIMUM ACCESS (MAS)POSTERIOR LUMBAR INTERBODY FUSION (PLIF) 2 LEVEL N/A 01/04/2017   Procedure: Lumbar four- five Lumbar five-Sacral one Maximum access posterior lumbar interbody fusion;  Surgeon: Erline Levine, MD;  Location: [facility];  Service: Neurosurgery;  Laterality: N/A;  Lumbar four- five Lumbar five-Sacral one Maximum access posterior lumbar interbody fusion  . NECK SURGERY  05/29/2013  . SCAR DEBRIDEMENT OF TOTAL KNEE Right 05/08/2016   Procedure: SCAR DEBRIDEMENT OF TOTAL KNEE, EXCISION OF SAPHENOUS NEUROMA;  Surgeon: Paralee Cancel, MD;   Location: [facility];  Service: Orthopedics;  Laterality: Right;  . SHOULDER SURGERY     2 left / x3 right  . SKIN CANCER EXCISION    . SKIN SURGERY     nose and right ear skin cancer surgery  . TOTAL KNEE ARTHROPLASTY Right 03/30/2015   Procedure: RIGHT TOTAL KNEE ARTHROPLASTY;  Surgeon: Latanya Maudlin, MD;  Location: [facility];  Service: Orthopedics;  Laterality: Right;     Medications: Current Meds  Medication Sig  . ALPRAZolam (XANAX) 0.5 MG tablet Take 0.5 mg by mouth at bedtime as needed for sleep.   Marland Kitchen aspirin EC 81 MG tablet Take 1 tablet (81 mg total) by mouth daily.  Marland Kitchen HYDROcodone-acetaminophen (NORCO/VICODIN) 5-325 MG tablet Take 1 tablet by mouth every 8 (eight) hours as needed for pain.  Marland Kitchen LYRICA 75 MG capsule Take 75 mg by mouth at bedtime.  . nitroGLYCERIN (NITROSTAT) 0.4 MG SL tablet Place 1 tablet (0.4 mg total) under the tongue every 5 (five) minutes as needed for chest pain.  . pantoprazole (PROTONIX) 40 MG tablet Take 40 mg by mouth daily as needed (for acid reflux).   . rosuvastatin (CRESTOR) 20 MG tablet TAKE 1 TABLET BY MOUTH  DAILY  . tamsulosin (FLOMAX) 0.4 MG CAPS capsule Take 0.4 mg by mouth daily.   . [DISCONTINUED] losartan (COZAAR) 100 MG tablet Take 50 mg by mouth every evening.   . [DISCONTINUED] nitroGLYCERIN (NITROSTAT) 0.4 MG SL tablet Place 1 tablet (0.4 mg total) under the tongue every 5 (five) minutes as needed for chest pain.  . [DISCONTINUED] nitroGLYCERIN (NITROSTAT) 0.4 MG SL tablet Place 1 tablet (0.4 mg total) under the tongue every 5 (five) minutes as needed for chest pain.     Allergies: Allergies  Allergen Reactions  . Nsaids Other (See Comments)    UNSPECIFIED REACTION  Heart troubles - MD suggested stopping  . Percocet [Oxycodone-Acetaminophen] Other (See Comments)    "feel funny"    Social History: The patient  reports that he quit smoking about 19 years ago. He has never used smokeless tobacco. He reports that he does not drink alcohol  or use drugs.   Family History: The patient's family history includes CAD in his sister and sister; CVA in his sister; Heart attack in his mother and sister.   Review of Systems: Please see the history of present illness.   Otherwise, the review of systems is positive for none.   All other systems are reviewed and negative.   Physical Exam: VS:  BP 120/62 (BP Location: Left Arm, Patient Position: Sitting, Cuff Size: Normal)   Pulse 62   Ht 5\' 9"  (1.753 m)   Wt 192 lb 1.9 oz (87.1 kg)   SpO2 97%   BMI 28.37 kg/m  .  BMI Body mass index is 28.37 kg/m.  Wt Readings from Last 3 Encounters:  10/02/17 192 lb 1.9 oz (87.1 kg)  04/09/17 191 lb 1.9 oz (86.7 kg)  01/04/17 193 lb (87.5 kg)    General: Pleasant. Rather talkative. Alert and in no acute distress.   HEENT: Normal.  Neck: Supple, no JVD, carotid bruits, or masses noted.  Cardiac: Regular rate and rhythm. No murmurs, rubs, or gallops. No edema.  Respiratory:  Lungs are clear to auscultation bilaterally with normal work of breathing.  GI: Soft and nontender.  MS: No deformity or atrophy. Gait and ROM intact.  Skin: Warm and dry. Color is normal.  Neuro:  Strength and sensation are intact and no gross focal deficits noted.  Psych: Alert, appropriate and with normal affect.   LABORATORY DATA:  EKG:  EKG is ordered today. This demonstrates NSR - PVC noted.  Lab Results  Component Value Date   WBC 7.7 07/17/2017   HGB 13.0 07/17/2017   HCT 38.8 (L) 07/17/2017   PLT 164 07/17/2017   GLUCOSE 98 07/17/2017   CHOL 137 07/09/2015   TRIG 95 07/09/2015   HDL 47 07/09/2015   LDLCALC 71 07/09/2015   ALT 13 07/09/2015   AST 22 03/23/2015   NA 138 07/17/2017   K 3.9 07/17/2017   CL 104 07/17/2017   CREATININE 0.82 07/17/2017   BUN 8 07/17/2017   CO2 24 07/17/2017   TSH 2.415 Test methodology is 3rd generation TSH 08/25/2008   INR 1.05 03/23/2015   HGBA1C  08/25/2008    5.7 (NOTE)   The ADA recommends the following  therapeutic goal for glycemic   control related to Hgb A1C measurement:   Goal of Therapy:   < 7.0% Hgb A1C   Reference: American Diabetes Association: Clinical Practice   Recommendations 2008, Diabetes Care,  2008, 31:(Suppl 1).       BNP (last 3 results) No results for input(s): BNP in the last 8760 hours.  ProBNP (last 3 results) No results for input(s): PROBNP in the last 8760 hours.   Other Studies Reviewed Today:  Myoview Study Highlights October 2018   Nuclear stress EF: 56%.  There was no ST segment deviation noted during stress.  The study is normal.  This is a low risk study.  The left ventricular ejection fraction is normal (55-65%).     Result Notes for Myocardial Perfusion Imaging   Notes recorded by Drue Novel I, RN on 04/18/2017 at 8:50 AM EDT Called and let the patient know that Dr. Tamala Julian said that we can proceed with the heart cath if he feels like he is bad enough to accept the risks of the procedure. Patient states that he just gets scared when he has to take his NTG because he has a strong family history. Patient states that he will wait on having the cath done at this time and will let us know if his symptoms worsen or if he feels like he is bad enough to proceed with the cath. Patient thanked me for the call and wanted to thank Dr. Tamala Julian as well. ------  Notes recorded by Drue Novel I, RN on 04/16/2017 at 3:23 PM EDT Patient made aware that his stress test was normal and that a copy has been sent to Dr. Felipa Eth.  Patient states that he is still having chest pain with activity and at rest and is having to use NTG. Patient is concerned that even though the test was normal that there is something wrong with his heart. Patient  made aware that there were no abnormal EKG changes to indicate ischemia. Patient still wanting to know if he should have a cath to assess further. Made patient aware that the information would be forwarded to Dr. Tamala Julian for  review ad further recommendation. ------  Notes recorded by Belva Crome, MD on 04/13/2017 at 10:29 PM EDT Let the patient know stress nuclear is normal.     Assessment/Plan:  1. CAD - low risk Myoview from October - ER visit back in January for chest pain - he continues to have multiple episodes of chest pain/angina - he is wanting to proceed on now with cardiac catheterization - The patient understands that risks include but are not limited to stroke (1 in 1000), death (1 in 1000), kidney failure [usually temporary] (1 in 500), bleeding (1 in 200), allergic reaction [possibly serious] (1 in 200), and agrees to proceed. Scheduled with Dr. Tamala Julian for 10/09/2017.   2. HTN - BP is fine - he is no longer on ARB therapy - no changes made today  3. HLD - on statin - labs from PCP noted. LDL is at goal.   Current medicines are reviewed with the patient today.  The patient does not have concerns regarding medicines other than what has been noted above.  The following changes have been made:  See above.  Labs/ tests ordered today include:    Orders Placed This Encounter  Procedures  . Basic metabolic panel  . CBC  . PT and PTT  . EKG 12-Lead     Disposition:   Further disposition pending.   Patient is agreeable to this plan and will call if any problems develop in the interim.   SignedTruitt Merle, NP  10/02/2017 8:24 AM  [facility] [address] [place], [place]  [address] Phone: [phone] Fax: [phone]

## 2017-10-02 NOTE — Patient Instructions (Addendum)
We will be checking the following labs today - BMET, CBC, PT & PTT   Medication Instructions:    Continue with your current medicines.   I have refilled NTG     Testing/Procedures To Be Arranged:  Cardiac catheterization   Other Special Instructions:   Your provider has recommended a cardiac catherization  You are scheduled for a cardiac catheterization on Tuesday, April 2nd at 7:30 AM with Dr. Tamala Julian or associate.  Please arrive at the [facility] (Main Entrance) at [facility] at [address] [place] Stay on Tuesday, April 2nd at 5:30AM.    Special note: Every effort is made to have your procedure done on time.   Please understand that emergencies sometimes delay a scheduled   procedure.  No food or drink after midnight on Monday.    You may take your morning medications with a sip of water on the day of your procedure.  Please take a baby aspirin (81 mg) on the morning of your procedure.   Medications to [facility] for a one night stay -- bring personal belongings.  Bring a current list of your medications and current insurance cards.  You MUST have a responsible person to drive you home. Someone MUST be with you the first 24 hours after you arrive home or your discharge will be delayed. Wear clothes that are easy to get on and off and wear slip on shoes.    Coronary Angiogram A coronary angiogram, also called coronary angiography, is an X-ray procedure used to look at the arteries in the heart. In this procedure, a dye (contrast dye) is injected through a long, hollow tube (catheter). The catheter is about the size of a piece of cooked spaghetti and is inserted through your groin, wrist, or arm. The dye is injected into each artery, and X-rays are then taken to show if there is a blockage in the arteries of your heart.  LET [facility] CARE PROVIDER KNOW ABOUT: Any allergies you have, including allergies to shellfish or contrast  dye.  All medicines you are taking, including vitamins, herbs, eye drops, creams, and over-the-counter medicines.  Previous problems you or members of your family have had with the use of anesthetics.  Any blood disorders you have.  Previous surgeries you have had. History of kidney problems or failure.  Other medical conditions you have.  RISKS AND COMPLICATIONS  Generally, a coronary angiogram is a safe procedure. However, about 1 person out of 1000 can have problems that may include: Allergic reaction to the dye. Bleeding/bruising from the access site or other locations. Kidney injury, especially in people with impaired kidney function. Stroke (rare). Heart attack (rare). Irregular rhythms (rare) Death (rare)  BEFORE THE PROCEDURE  Do not eat or drink anything after midnight the night before the procedure or as directed by your health care provider.  Ask your health care provider about changing or stopping your regular medicines. This is especially important if you are taking diabetes medicines or blood thinners.  PROCEDURE You may be given a medicine to help you relax (sedative) before the procedure. This medicine is given through an intravenous (IV) access tube that is inserted into one of your veins.  The area where the catheter will be inserted will be washed and shaved. This is usually done in the groin but may be done in the fold of your arm (near your elbow) or in the wrist.  A medicine  will be given to numb the area where the catheter will be inserted (local anesthetic).  The health care provider will insert the catheter into an artery. The catheter will be guided by using a special type of X-ray (fluoroscopy) of the blood vessel being examined.  A special dye will then be injected into the catheter, and X-rays will be taken. The dye will help to show where any narrowing or blockages are located in the heart arteries.    AFTER THE PROCEDURE  If the procedure is  done through the leg, you will be kept in bed lying flat for several hours. You will be instructed to not bend or cross your legs. The insertion site will be checked frequently.  The pulse in your feet or wrist will be checked frequently.  Additional blood tests, X-rays, and an electrocardiogram may be done.         If you need a refill on your cardiac medications before your next appointment, please call your pharmacy.   Call the [place] office at [phone] if you have any questions, problems or concerns.

## 2017-10-02 NOTE — Addendum Note (Signed)
Addended by: Burtis Junes on: 10/02/2017 11:00 AM   Modules accepted: Orders, SmartSet

## 2017-10-02 NOTE — Progress Notes (Signed)
CARDIOLOGY OFFICE NOTE  Date:  10/02/2017    Edward [name] Date of Birth: 1950-12-06 Medical Record #[id number]  PCP:  [name], MD  Cardiologist:  Tamala Julian  Chief Complaint  Patient presents with  . Coronary Artery Disease    6 month check - seen for Dr. Tamala Julian    History of Present Illness: Edward [name] is a 67 y.o. male who presents today for a 6 month check. Seen for Dr. Tamala Julian.   He has a history of coronary artery disease with last cath 2010 with nonobstructive disease, GERD, HLD and HTN.    Last seen back in October - noted more angina and was requiring NTG. Myoview was updated.   Comes in today. Here alone. He is off his ARB - was having orthostasis - this has improved somewhat with stopping the ARB. He continues to use NTG on occasion - ?more - sometimes he gets relief with just deep breathing but does use NTG. He was in the ER in early January with a "stabbing chest pain" - fleeting - negative evaluation with 2 negative troponins. This was unlike his typical angina but "scared" him. This has continued - still very fleeting. He notes that he continues to have his typical angina - he has had to use 3 NTG on occasion. FH is quite +. He admits he is quite scared. He has had chest pain with walking but typically his symptoms occur afterwards. He does get winded easily. He notes he is now ready to proceed on with cardiac catheterization to "see what is going on".   Past Medical History:  Diagnosis Date  . Anginal pain ([place])    last NTG 3-4 months ago  . Anxiety    claustrophobic  . Arthritis    throughout arms, knees & back & neck  . CAD (coronary artery disease)    with high grade obstruction second obtuse marginal and diffuse LAD and circumflex disease by cath 2010  . Cancer ([place])    skin cancer, on R ear - basal cell   . Cold sore    right upper lip, healing well  . Colon polyp   . Elevated cholesterol    LDL Goal < 70  . Erectile dysfunction   . GERD  (gastroesophageal reflux disease)    has not had problem several months  . History of nonmelanoma skin cancer   . History of nuclear stress test 5/10   no ischemia  . Hypertension     Past Surgical History:  Procedure Laterality Date  . ANTERIOR CERVICAL DECOMP/DISCECTOMY FUSION N/A 05/29/2013   Procedure: Cervical five-six Anterior cervical decompression/diskectomy/fusion with exploration of Cervical six-seven, hardware removal ;  Surgeon: Erline Levine, MD;  Location: [facility];  Service: Neurosurgery;  Laterality: N/A;  Cervical five-six Anterior cervical decompression/diskectomy/fusion with exploration of Cervical six-seven, Hardware removal   . BACK SURGERY     lower  . CARDIAC CATHETERIZATION     2004 & 2/10, normal LV function  . CERVICAL FUSION    . COLONOSCOPY WITH PROPOFOL N/A 05/14/2014   Procedure: COLONOSCOPY WITH PROPOFOL;  Surgeon: Garlan Fair, MD;  Location: [facility];  Service: Endoscopy;  Laterality: N/A;  . FINGER FRACTURE SURGERY     by Dr. Daylene Katayama  . I&D KNEE WITH POLY EXCHANGE Right 05/08/2016   Procedure: POLY EXCHANGE;  Surgeon: Paralee Cancel, MD;  Location: [facility];  Service: Orthopedics;  Laterality: Right;  . KNEE ARTHROSCOPY  06/13/2011  Procedure: ARTHROSCOPY KNEE;  Surgeon: Tobi Bastos;  Location: [facility];  Service: Orthopedics;  Laterality: Right;  Right Knee Arthroscopy with Medial Menisectomy  . MAXIMUM ACCESS (MAS)POSTERIOR LUMBAR INTERBODY FUSION (PLIF) 2 LEVEL N/A 01/04/2017   Procedure: Lumbar four- five Lumbar five-Sacral one Maximum access posterior lumbar interbody fusion;  Surgeon: Erline Levine, MD;  Location: [facility];  Service: Neurosurgery;  Laterality: N/A;  Lumbar four- five Lumbar five-Sacral one Maximum access posterior lumbar interbody fusion  . NECK SURGERY  05/29/2013  . SCAR DEBRIDEMENT OF TOTAL KNEE Right 05/08/2016   Procedure: SCAR DEBRIDEMENT OF TOTAL KNEE, EXCISION OF SAPHENOUS NEUROMA;  Surgeon: Paralee Cancel, MD;   Location: [facility];  Service: Orthopedics;  Laterality: Right;  . SHOULDER SURGERY     2 left / x3 right  . SKIN CANCER EXCISION    . SKIN SURGERY     nose and right ear skin cancer surgery  . TOTAL KNEE ARTHROPLASTY Right 03/30/2015   Procedure: RIGHT TOTAL KNEE ARTHROPLASTY;  Surgeon: Latanya Maudlin, MD;  Location: [facility];  Service: Orthopedics;  Laterality: Right;     Medications: Current Meds  Medication Sig  . ALPRAZolam (XANAX) 0.5 MG tablet Take 0.5 mg by mouth at bedtime as needed for sleep.   Marland Kitchen aspirin EC 81 MG tablet Take 1 tablet (81 mg total) by mouth daily.  Marland Kitchen HYDROcodone-acetaminophen (NORCO/VICODIN) 5-325 MG tablet Take 1 tablet by mouth every 8 (eight) hours as needed for pain.  Marland Kitchen LYRICA 75 MG capsule Take 75 mg by mouth at bedtime.  . nitroGLYCERIN (NITROSTAT) 0.4 MG SL tablet Place 1 tablet (0.4 mg total) under the tongue every 5 (five) minutes as needed for chest pain.  . pantoprazole (PROTONIX) 40 MG tablet Take 40 mg by mouth daily as needed (for acid reflux).   . rosuvastatin (CRESTOR) 20 MG tablet TAKE 1 TABLET BY MOUTH  DAILY  . tamsulosin (FLOMAX) 0.4 MG CAPS capsule Take 0.4 mg by mouth daily.   . [DISCONTINUED] losartan (COZAAR) 100 MG tablet Take 50 mg by mouth every evening.   . [DISCONTINUED] nitroGLYCERIN (NITROSTAT) 0.4 MG SL tablet Place 1 tablet (0.4 mg total) under the tongue every 5 (five) minutes as needed for chest pain.  . [DISCONTINUED] nitroGLYCERIN (NITROSTAT) 0.4 MG SL tablet Place 1 tablet (0.4 mg total) under the tongue every 5 (five) minutes as needed for chest pain.     Allergies: Allergies  Allergen Reactions  . Nsaids Other (See Comments)    UNSPECIFIED REACTION  Heart troubles - MD suggested stopping  . Percocet [Oxycodone-Acetaminophen] Other (See Comments)    "feel funny"    Social History: The patient  reports that he quit smoking about 19 years ago. He has never used smokeless tobacco. He reports that he does not drink alcohol  or use drugs.   Family History: The patient's family history includes CAD in his sister and sister; CVA in his sister; Heart attack in his mother and sister.   Review of Systems: Please see the history of present illness.   Otherwise, the review of systems is positive for none.   All other systems are reviewed and negative.   Physical Exam: VS:  BP 120/62 (BP Location: Left Arm, Patient Position: Sitting, Cuff Size: Normal)   Pulse 62   Ht 5\' 9"  (1.753 m)   Wt 192 lb 1.9 oz (87.1 kg)   SpO2 97%   BMI 28.37 kg/m  .  BMI Body mass index is 28.37 kg/m.  Wt Readings from Last 3 Encounters:  10/02/17 192 lb 1.9 oz (87.1 kg)  04/09/17 191 lb 1.9 oz (86.7 kg)  01/04/17 193 lb (87.5 kg)    General: Pleasant. Rather talkative. Alert and in no acute distress.   HEENT: Normal.  Neck: Supple, no JVD, carotid bruits, or masses noted.  Cardiac: Regular rate and rhythm. No murmurs, rubs, or gallops. No edema.  Respiratory:  Lungs are clear to auscultation bilaterally with normal work of breathing.  GI: Soft and nontender.  MS: No deformity or atrophy. Gait and ROM intact.  Skin: Warm and dry. Color is normal.  Neuro:  Strength and sensation are intact and no gross focal deficits noted.  Psych: Alert, appropriate and with normal affect.   LABORATORY DATA:  EKG:  EKG is ordered today. This demonstrates NSR - PVC noted.  Lab Results  Component Value Date   WBC 7.7 07/17/2017   HGB 13.0 07/17/2017   HCT 38.8 (L) 07/17/2017   PLT 164 07/17/2017   GLUCOSE 98 07/17/2017   CHOL 137 07/09/2015   TRIG 95 07/09/2015   HDL 47 07/09/2015   LDLCALC 71 07/09/2015   ALT 13 07/09/2015   AST 22 03/23/2015   NA 138 07/17/2017   K 3.9 07/17/2017   CL 104 07/17/2017   CREATININE 0.82 07/17/2017   BUN 8 07/17/2017   CO2 24 07/17/2017   TSH 2.415 Test methodology is 3rd generation TSH 08/25/2008   INR 1.05 03/23/2015   HGBA1C  08/25/2008    5.7 (NOTE)   The ADA recommends the following  therapeutic goal for glycemic   control related to Hgb A1C measurement:   Goal of Therapy:   < 7.0% Hgb A1C   Reference: American Diabetes Association: Clinical Practice   Recommendations 2008, Diabetes Care,  2008, 31:(Suppl 1).       BNP (last 3 results) No results for input(s): BNP in the last 8760 hours.  ProBNP (last 3 results) No results for input(s): PROBNP in the last 8760 hours.   Other Studies Reviewed Today:  Myoview Study Highlights October 2018   Nuclear stress EF: 56%.  There was no ST segment deviation noted during stress.  The study is normal.  This is a low risk study.  The left ventricular ejection fraction is normal (55-65%).     Result Notes for Myocardial Perfusion Imaging   Notes recorded by Drue Novel I, RN on 04/18/2017 at 8:50 AM EDT Called and let the patient know that Dr. Tamala Julian said that we can proceed with the heart cath if he feels like he is bad enough to accept the risks of the procedure. Patient states that he just gets scared when he has to take his NTG because he has a strong family history. Patient states that he will wait on having the cath done at this time and will let us know if his symptoms worsen or if he feels like he is bad enough to proceed with the cath. Patient thanked me for the call and wanted to thank Dr. Tamala Julian as well. ------  Notes recorded by Drue Novel I, RN on 04/16/2017 at 3:23 PM EDT Patient made aware that his stress test was normal and that a copy has been sent to Dr. Felipa Eth.  Patient states that he is still having chest pain with activity and at rest and is having to use NTG. Patient is concerned that even though the test was normal that there is something wrong with his heart. Patient  made aware that there were no abnormal EKG changes to indicate ischemia. Patient still wanting to know if he should have a cath to assess further. Made patient aware that the information would be forwarded to Dr. Tamala Julian for  review ad further recommendation. ------  Notes recorded by Belva Crome, MD on 04/13/2017 at 10:29 PM EDT Let the patient know stress nuclear is normal.     Assessment/Plan:  1. CAD - low risk Myoview from October - ER visit back in January for chest pain - he continues to have multiple episodes of chest pain/angina - he is wanting to proceed on now with cardiac catheterization - The patient understands that risks include but are not limited to stroke (1 in 1000), death (1 in 1000), kidney failure [usually temporary] (1 in 500), bleeding (1 in 200), allergic reaction [possibly serious] (1 in 200), and agrees to proceed. Scheduled with Dr. Tamala Julian for 10/09/2017.   2. HTN - BP is fine - he is no longer on ARB therapy - no changes made today  3. HLD - on statin - labs from PCP noted. LDL is at goal.   Current medicines are reviewed with the patient today.  The patient does not have concerns regarding medicines other than what has been noted above.  The following changes have been made:  See above.  Labs/ tests ordered today include:    Orders Placed This Encounter  Procedures  . Basic metabolic panel  . CBC  . PT and PTT  . EKG 12-Lead     Disposition:   Further disposition pending.   Patient is agreeable to this plan and will call if any problems develop in the interim.   SignedTruitt Merle, NP  10/02/2017 8:24 AM  [place] [address] [place],   [address] Phone: [phone] Fax: [phone]

## 2017-10-03 ENCOUNTER — Telehealth: Payer: Self-pay | Admitting: Interventional Cardiology

## 2017-10-03 NOTE — Telephone Encounter (Signed)
Mr.Repka is calling to seeif it would be okay for him to take a laxative to help him go to the bathroom . Please call

## 2017-10-03 NOTE — Telephone Encounter (Signed)
Pt is having a cardiac cath on Tuesday. Pt wanted to know if it was okay for him to  take a laxative today. Pt was made aware that it was fine. Pt verbalized understanding.

## 2017-10-04 DIAGNOSIS — G8929 Other chronic pain: Secondary | ICD-10-CM | POA: Insufficient documentation

## 2017-10-04 DIAGNOSIS — G894 Chronic pain syndrome: Secondary | ICD-10-CM | POA: Insufficient documentation

## 2017-10-08 ENCOUNTER — Telehealth: Payer: Self-pay | Admitting: *Deleted

## 2017-10-08 NOTE — Telephone Encounter (Signed)
Pt contacted pre-catheterization scheduled at [facility] for: Tuesday October 09, 2017 7:30 AM Verified arrival time and place: [place] at: 5:30 AM Nothing to eat or drink after midnight prior to cath. Verified no diabetes medications. Verified allergies in Epic.  Verified AM meds can be  taken pre-cath with sip of water including: ASA 81 mg  Confirmed patient has responsible person to drive home post procedure and observe patient for 24 hours: yes

## 2017-10-09 ENCOUNTER — Ambulatory Visit (HOSPITAL_COMMUNITY)
Admission: RE | Admit: 2017-10-09 | Discharge: 2017-10-09 | Disposition: A | Discharge status: Home / Self Care | Payer: Medicare Other | Source: Ambulatory Visit | Attending: Interventional Cardiology | Admitting: Interventional Cardiology

## 2017-10-09 ENCOUNTER — Encounter (HOSPITAL_COMMUNITY): Payer: Self-pay | Admitting: Interventional Cardiology

## 2017-10-09 ENCOUNTER — Ambulatory Visit (HOSPITAL_COMMUNITY)
Admission: RE | Disposition: A | Discharge status: Home / Self Care | Payer: Self-pay | Source: Ambulatory Visit | Attending: Interventional Cardiology

## 2017-10-09 DIAGNOSIS — Z885 Allergy status to narcotic agent status: Secondary | ICD-10-CM | POA: Diagnosis not present

## 2017-10-09 DIAGNOSIS — I25709 Atherosclerosis of coronary artery bypass graft(s), unspecified, with unspecified angina pectoris: Secondary | ICD-10-CM

## 2017-10-09 DIAGNOSIS — I25118 Atherosclerotic heart disease of native coronary artery with other forms of angina pectoris: Secondary | ICD-10-CM | POA: Insufficient documentation

## 2017-10-09 DIAGNOSIS — I10 Essential (primary) hypertension: Secondary | ICD-10-CM | POA: Diagnosis not present

## 2017-10-09 DIAGNOSIS — F419 Anxiety disorder, unspecified: Secondary | ICD-10-CM | POA: Insufficient documentation

## 2017-10-09 DIAGNOSIS — Z7982 Long term (current) use of aspirin: Secondary | ICD-10-CM | POA: Diagnosis not present

## 2017-10-09 DIAGNOSIS — M1990 Unspecified osteoarthritis, unspecified site: Secondary | ICD-10-CM | POA: Insufficient documentation

## 2017-10-09 DIAGNOSIS — I2584 Coronary atherosclerosis due to calcified coronary lesion: Secondary | ICD-10-CM | POA: Diagnosis not present

## 2017-10-09 DIAGNOSIS — Z8249 Family history of ischemic heart disease and other diseases of the circulatory system: Secondary | ICD-10-CM | POA: Insufficient documentation

## 2017-10-09 DIAGNOSIS — E7800 Pure hypercholesterolemia, unspecified: Secondary | ICD-10-CM | POA: Insufficient documentation

## 2017-10-09 DIAGNOSIS — E7849 Other hyperlipidemia: Secondary | ICD-10-CM

## 2017-10-09 DIAGNOSIS — K219 Gastro-esophageal reflux disease without esophagitis: Secondary | ICD-10-CM | POA: Diagnosis not present

## 2017-10-09 DIAGNOSIS — I25119 Atherosclerotic heart disease of native coronary artery with unspecified angina pectoris: Secondary | ICD-10-CM | POA: Diagnosis present

## 2017-10-09 DIAGNOSIS — Z87891 Personal history of nicotine dependence: Secondary | ICD-10-CM | POA: Insufficient documentation

## 2017-10-09 DIAGNOSIS — E785 Hyperlipidemia, unspecified: Secondary | ICD-10-CM | POA: Diagnosis present

## 2017-10-09 HISTORY — PX: LEFT HEART CATH AND CORONARY ANGIOGRAPHY: CATH118249

## 2017-10-09 SURGERY — LEFT HEART CATH AND CORONARY ANGIOGRAPHY
Anesthesia: LOCAL

## 2017-10-09 MED ORDER — FENTANYL CITRATE (PF) 100 MCG/2ML IJ SOLN
INTRAMUSCULAR | Status: AC
  Filled 2017-10-09: qty 2

## 2017-10-09 MED ORDER — ONDANSETRON HCL 4 MG/2ML IJ SOLN: 4.0000 mg | Freq: Four times a day (QID) | INTRAMUSCULAR | Status: DC | PRN

## 2017-10-09 MED ORDER — SODIUM CHLORIDE 0.9% FLUSH: 3.0000 mL | Freq: Two times a day (BID) | INTRAVENOUS | Status: DC

## 2017-10-09 MED ORDER — VERAPAMIL HCL 2.5 MG/ML IV SOLN
INTRAVENOUS | Status: DC | PRN
  Administered 2017-10-09: 10 mL via INTRA_ARTERIAL

## 2017-10-09 MED ORDER — ASPIRIN 81 MG PO CHEW: 81.0000 mg | CHEWABLE_TABLET | ORAL | Status: DC

## 2017-10-09 MED ORDER — HEPARIN (PORCINE) IN NACL 2-0.9 UNIT/ML-% IJ SOLN
INTRAMUSCULAR | Status: AC
  Filled 2017-10-09: qty 500

## 2017-10-09 MED ORDER — HEPARIN SODIUM (PORCINE) 1000 UNIT/ML IJ SOLN
INTRAMUSCULAR | Status: AC
  Filled 2017-10-09: qty 1

## 2017-10-09 MED ORDER — ACETAMINOPHEN 325 MG PO TABS: 650.0000 mg | ORAL_TABLET | ORAL | Status: DC | PRN

## 2017-10-09 MED ORDER — DIAZEPAM 5 MG PO TABS
10.0000 mg | ORAL_TABLET | ORAL | Status: AC
  Administered 2017-10-09: 10 mg via ORAL

## 2017-10-09 MED ORDER — LIDOCAINE HCL 1 % IJ SOLN
INTRAMUSCULAR | Status: AC
  Filled 2017-10-09: qty 20

## 2017-10-09 MED ORDER — HEPARIN SODIUM (PORCINE) 1000 UNIT/ML IJ SOLN
INTRAMUSCULAR | Status: DC | PRN
  Administered 2017-10-09: 4000 [IU] via INTRAVENOUS

## 2017-10-09 MED ORDER — VERAPAMIL HCL 2.5 MG/ML IV SOLN
INTRAVENOUS | Status: AC
  Filled 2017-10-09: qty 2

## 2017-10-09 MED ORDER — DIAZEPAM 5 MG PO TABS
ORAL_TABLET | ORAL | Status: AC
  Administered 2017-10-09: 10 mg via ORAL
  Filled 2017-10-09: qty 2

## 2017-10-09 MED ORDER — HEPARIN (PORCINE) IN NACL 2-0.9 UNIT/ML-% IJ SOLN
INTRAMUSCULAR | Status: AC | PRN
  Administered 2017-10-09 (×2): 500 mL via INTRA_ARTERIAL

## 2017-10-09 MED ORDER — SODIUM CHLORIDE 0.9 % IV SOLN: INTRAVENOUS | Status: DC

## 2017-10-09 MED ORDER — MIDAZOLAM HCL 2 MG/2ML IJ SOLN
INTRAMUSCULAR | Status: DC | PRN
  Administered 2017-10-09 (×2): 1 mg via INTRAVENOUS

## 2017-10-09 MED ORDER — SODIUM CHLORIDE 0.9 % IV SOLN: 250.0000 mL | INTRAVENOUS | Status: DC | PRN

## 2017-10-09 MED ORDER — LIDOCAINE HCL (PF) 1 % IJ SOLN
INTRAMUSCULAR | Status: DC | PRN
  Administered 2017-10-09: 2 mL

## 2017-10-09 MED ORDER — SODIUM CHLORIDE 0.9 % IV SOLN
INTRAVENOUS | Status: DC
  Administered 2017-10-09: 07:00:00 via INTRAVENOUS

## 2017-10-09 MED ORDER — FENTANYL CITRATE (PF) 100 MCG/2ML IJ SOLN
INTRAMUSCULAR | Status: DC | PRN
  Administered 2017-10-09: 50 ug via INTRAVENOUS

## 2017-10-09 MED ORDER — MIDAZOLAM HCL 2 MG/2ML IJ SOLN
INTRAMUSCULAR | Status: AC
  Filled 2017-10-09: qty 2

## 2017-10-09 MED ORDER — SODIUM CHLORIDE 0.9% FLUSH: 3.0000 mL | INTRAVENOUS | Status: DC | PRN

## 2017-10-09 MED ORDER — OXYCODONE HCL 5 MG PO TABS: 5.0000 mg | ORAL_TABLET | ORAL | Status: DC | PRN

## 2017-10-09 MED ORDER — IOHEXOL 350 MG/ML SOLN
INTRAVENOUS | Status: DC | PRN
  Administered 2017-10-09: 75 mL via INTRAVENOUS

## 2017-10-09 SURGICAL SUPPLY — 11 items
CATH INFINITI 5 FR JL3.5 (CATHETERS) ×1 IMPLANT
CATH INFINITI JR4 5F (CATHETERS) ×1 IMPLANT
COVER PRB 48X5XTLSCP FOLD TPE (BAG) IMPLANT
COVER PROBE 5X48 (BAG) ×2
GLIDESHEATH SLEND A-KIT 6F 22G (SHEATH) ×1 IMPLANT
GUIDEWIRE INQWIRE 1.5J.035X260 (WIRE) IMPLANT
INQWIRE 1.5J .035X260CM (WIRE) ×2
KIT HEART LEFT (KITS) ×2 IMPLANT
PACK CARDIAC CATHETERIZATION (CUSTOM PROCEDURE TRAY) ×2 IMPLANT
TRANSDUCER W/STOPCOCK (MISCELLANEOUS) ×2 IMPLANT
TUBING CIL FLEX 10 FLL-RA (TUBING) ×2 IMPLANT

## 2017-10-09 NOTE — Research (Signed)
CADFEM Informed Consent   Subject Name: Edward [name]  Subject met inclusion and exclusion criteria.  The informed consent form, study requirements and expectations were reviewed with the subject and questions and concerns were addressed prior to the signing of the consent form.  The subject verbalized understanding of the trail requirements.  The subject agreed to participate in the CADFEM trial and signed the informed consent.  The informed consent was obtained prior to performance of any protocol-specific procedures for the subject.  A copy of the signed informed consent was given to the subject and a copy was placed in the subject's medical record.  Christena Flake 10/09/2017, 06:50 AM

## 2017-10-09 NOTE — Interval H&P Note (Signed)
Cath Lab Visit (complete for each Cath Lab visit)  Clinical Evaluation Leading to the Procedure:   ACS: No.  Non-ACS:    Anginal Classification: CCS III  Anti-ischemic medical therapy: Minimal Therapy (1 class of medications)  Non-Invasive Test Results: Low-risk stress test findings: cardiac mortality <1%/year  Prior CABG: No previous CABG      History and Physical Interval Note:  10/09/2017 7:36 AM  Edward [name]  has presented today for surgery, with the diagnosis of angina  The various methods of treatment have been discussed with the patient and family. After consideration of risks, benefits and other options for treatment, the patient has consented to  Procedure(s): LEFT HEART CATH AND CORONARY ANGIOGRAPHY (N/A) as a surgical intervention .  The patient's history has been reviewed, patient examined, no change in status, stable for surgery.  I have reviewed the patient's chart and labs.  Questions were answered to the patient's satisfaction.     [name]

## 2017-10-09 NOTE — Discharge Instructions (Signed)

## 2017-10-10 ENCOUNTER — Telehealth: Payer: Self-pay | Admitting: *Deleted

## 2017-10-10 NOTE — Telephone Encounter (Signed)
Patient called earlier wanting to withdraw from [place] study. I informed the patient that his signal did not upload so there would be no information for [place] to collect and I would withdraw him from the study.

## 2017-10-11 MED FILL — Lidocaine HCl Local Inj 1%: INTRAMUSCULAR | Qty: 20 | Status: AC

## 2017-10-11 MED FILL — Heparin Sodium (Porcine) 2 Unit/ML in Sodium Chloride 0.9%: INTRAMUSCULAR | Qty: 1000 | Status: AC

## 2017-10-16 DIAGNOSIS — Z96651 Presence of right artificial knee joint: Secondary | ICD-10-CM | POA: Insufficient documentation

## 2017-10-25 ENCOUNTER — Other Ambulatory Visit (HOSPITAL_COMMUNITY): Payer: Self-pay | Admitting: Orthopedic Surgery

## 2017-11-02 ENCOUNTER — Other Ambulatory Visit (HOSPITAL_COMMUNITY): Payer: Self-pay | Admitting: Orthopedic Surgery

## 2017-11-02 DIAGNOSIS — Z96651 Presence of right artificial knee joint: Principal | ICD-10-CM

## 2017-11-02 DIAGNOSIS — T8484XA Pain due to internal orthopedic prosthetic devices, implants and grafts, initial encounter: Secondary | ICD-10-CM

## 2017-11-08 ENCOUNTER — Encounter (HOSPITAL_COMMUNITY)
Admission: RE | Admit: 2017-11-08 | Discharge: 2017-11-08 | Disposition: A | Discharge status: Home / Self Care | Payer: Medicare Other | Source: Ambulatory Visit | Attending: Orthopedic Surgery | Admitting: Orthopedic Surgery

## 2017-11-08 DIAGNOSIS — T8484XA Pain due to internal orthopedic prosthetic devices, implants and grafts, initial encounter: Secondary | ICD-10-CM | POA: Insufficient documentation

## 2017-11-08 DIAGNOSIS — X58XXXA Exposure to other specified factors, initial encounter: Secondary | ICD-10-CM | POA: Diagnosis not present

## 2017-11-08 DIAGNOSIS — Z96651 Presence of right artificial knee joint: Secondary | ICD-10-CM | POA: Diagnosis present

## 2017-11-08 MED ORDER — TECHNETIUM TC 99M MEDRONATE IV KIT
21.2000 | PACK | Freq: Once | INTRAVENOUS | Status: AC | PRN
  Administered 2017-11-08: 21.2 via INTRAVENOUS

## 2018-04-16 NOTE — Progress Notes (Addendum)
Cardiology Office Note:    Date:  04/17/2018   ID:  Edward [name], DOB May 15, 1951, MRN [id number]  PCP:  [name], MD  Cardiologist:  [name], MD   Referring MD: [name], MD   Chief Complaint  Patient presents with  . Coronary Artery Disease    History of Present Illness:    Edward [name] is a 67 y.o. male with a hx of history of coronary artery disease with last cath 2010 with nonobstructive disease., gastroesophageal reflux, hyperlipidemia, and hypertension.  He is doing well.  Denies angina pectoris.  No peripheral edema.  Denies orthopnea.  No nitroglycerin use.  Probably getting greater than 150 minutes of moderate aerobic activity per week.  Past Medical History:  Diagnosis Date  . Anginal pain ([place])    last NTG 3-4 months ago  . Anxiety    claustrophobic  . Arthritis    throughout arms, knees & back & neck  . CAD (coronary artery disease)    with high grade obstruction second obtuse marginal and diffuse LAD and circumflex disease by cath 2010  . Cancer ([place])    skin cancer, on R ear - basal cell   . Cold sore    right upper lip, healing well  . Colon polyp   . Elevated cholesterol    LDL Goal < 70  . Erectile dysfunction   . GERD (gastroesophageal reflux disease)    has not had problem several months  . History of nonmelanoma skin cancer   . History of nuclear stress test 5/10   no ischemia  . Hypertension     Past Surgical History:  Procedure Laterality Date  . ANTERIOR CERVICAL DECOMP/DISCECTOMY FUSION N/A 05/29/2013   Procedure: Cervical five-six Anterior cervical decompression/diskectomy/fusion with exploration of Cervical six-seven, hardware removal ;  Surgeon: Erline Levine, MD;  Location: [place] [facility];  Service: Neurosurgery;  Laterality: N/A;  Cervical five-six Anterior cervical decompression/diskectomy/fusion with exploration of Cervical six-seven, Hardware removal   . BACK SURGERY     lower  . CARDIAC CATHETERIZATION     2004  & 2/10, normal LV function  . CERVICAL FUSION    . COLONOSCOPY WITH PROPOFOL N/A 05/14/2014   Procedure: COLONOSCOPY WITH PROPOFOL;  Surgeon: Garlan Fair, MD;  [facility];  Service: Endoscopy;  Laterality: N/A;  . FINGER FRACTURE SURGERY     by Dr. Daylene Katayama  . I&D KNEE WITH POLY EXCHANGE Right 05/08/2016   Procedure: POLY EXCHANGE;  Surgeon: Paralee Cancel, MD;  Location: [facility];  Service: Orthopedics;  Laterality: Right;  . KNEE ARTHROSCOPY  06/13/2011   Procedure: ARTHROSCOPY KNEE;  Surgeon: Tobi Bastos;  [facility];  Service: Orthopedics;  Laterality: Right;  Right Knee Arthroscopy with Medial Menisectomy  . LEFT HEART CATH AND CORONARY ANGIOGRAPHY N/A 10/09/2017   Procedure: LEFT HEART CATH AND CORONARY ANGIOGRAPHY;  Surgeon: Belva Crome, MD;  Location: [place] [facility];  Service: Cardiovascular;  Laterality: N/A;  . MAXIMUM ACCESS (MAS)POSTERIOR LUMBAR INTERBODY FUSION (PLIF) 2 LEVEL N/A 01/04/2017   Procedure: Lumbar four- five Lumbar five-Sacral one Maximum access posterior lumbar interbody fusion;  Surgeon: Erline Levine, MD;  Location: [place];  Service: Neurosurgery;  Laterality: N/A;  Lumbar four- five Lumbar five-Sacral one Maximum access posterior lumbar interbody fusion  . NECK SURGERY  05/29/2013  . SCAR DEBRIDEMENT OF TOTAL KNEE Right 05/08/2016   Procedure: SCAR DEBRIDEMENT OF TOTAL KNEE, EXCISION OF SAPHENOUS NEUROMA;  Surgeon: Paralee Cancel,  MD;  [facility];  Service: Orthopedics;  Laterality: Right;  . SHOULDER SURGERY     2 left / x3 right  . SKIN CANCER EXCISION    . SKIN SURGERY     nose and right ear skin cancer surgery  . TOTAL KNEE ARTHROPLASTY Right 03/30/2015   Procedure: RIGHT TOTAL KNEE ARTHROPLASTY;  Surgeon: Latanya Maudlin, MD;  [facility];  Service: Orthopedics;  Laterality: Right;    Current Medications: Current Meds  Medication Sig  . ALPRAZolam (XANAX) 0.5 MG tablet Take 0.5 mg by mouth at bedtime as needed for sleep.     Marland Kitchen aspirin EC 81 MG tablet Take 1 tablet (81 mg total) by mouth daily.  Marland Kitchen HYDROcodone-acetaminophen (NORCO/VICODIN) 5-325 MG tablet Take 1 tablet by mouth every 8 (eight) hours as needed for pain.  Marland Kitchen LYRICA 75 MG capsule Take 75 mg by mouth at bedtime.  . nitroGLYCERIN (NITROSTAT) 0.4 MG SL tablet Place 1 tablet (0.4 mg total) under the tongue every 5 (five) minutes as needed for chest pain.  . pantoprazole (PROTONIX) 40 MG tablet Take 40 mg by mouth daily as needed (for acid reflux).   . rosuvastatin (CRESTOR) 20 MG tablet TAKE 1 TABLET BY MOUTH  DAILY (Patient taking differently: TAKE 20 MG BY MOUTH DAILY)  . tamsulosin (FLOMAX) 0.4 MG CAPS capsule Take 0.4 mg by mouth daily.      Allergies:   Nsaids and Percocet [oxycodone-acetaminophen]   Social History   Socioeconomic History  . Marital status: Divorced    Spouse name: Not on file  . Number of children: Not on file  . Years of education: Not on file  . Highest education level: Not on file  Occupational History  . Not on file  Social Needs  . Financial resource strain: Not on file  . Food insecurity:    Worry: Not on file    Inability: Not on file  . Transportation needs:    Medical: Not on file    Non-medical: Not on file  Tobacco Use  . Smoking status: Former Smoker    Last attempt to quit: 07/10/1998    Years since quitting: 19.7  . Smokeless tobacco: Never Used  Substance and Sexual Activity  . Alcohol use: No  . Drug use: No  . Sexual activity: Not on file  Lifestyle  . Physical activity:    Days per week: Not on file    Minutes per session: Not on file  . Stress: Not on file  Relationships  . Social connections:    Talks on phone: Not on file    Gets together: Not on file    Attends religious service: Not on file    Active member of club or organization: Not on file    Attends meetings of clubs or organizations: Not on file    Relationship status: Not on file  Other Topics Concern  . Not on file  Social  History Narrative  . Not on file     Family History: The patient's family history includes CAD in his sister and sister; CVA in his sister; Heart attack in his mother and sister.  ROS:   Please see the history of present illness.    Occasional chest pain, leg swelling, hearing loss, vision disturbance, blood in stool, back pain, dizziness, easy bruising or joint swelling difficulty with balance, headaches, irregular heartbeat, and chest pressure.  All other systems reviewed and are negative.  EKGs/Labs/Other Studies Reviewed:  The following studies were reviewed today: No new data  EKG:  EKG is not ordered today.  The most recent EKG done 10/02/2017 demonstrates sinus bradycardia with an occasional premature beat.  No acute ischemic change.  Recent Labs: 10/02/2017: BUN 7; Creatinine, Ser 0.75; Hemoglobin 13.2; Platelets 161; Potassium 4.4; Sodium 145  Recent Lipid Panel    Component Value Date/Time   CHOL 137 07/09/2015 0856   TRIG 95 07/09/2015 0856   HDL 47 07/09/2015 0856   CHOLHDL 2.9 07/09/2015 0856   VLDL 19 07/09/2015 0856   LDLCALC 71 07/09/2015 0856    Physical Exam:    VS:  BP 102/82   Pulse 74   Ht 5\' 9"  (1.753 m)   Wt 194 lb 6.4 oz (88.2 kg)   BMI 28.71 kg/m     Wt Readings from Last 3 Encounters:  04/17/18 194 lb 6.4 oz (88.2 kg)  10/09/17 194 lb (88 kg)  10/02/17 192 lb 1.9 oz (87.1 kg)     GEN:  Well nourished, well developed in no acute distress HEENT: Normal NECK: No JVD. LYMPHATICS: No lymphadenopathy CARDIAC: RRR, no murmur, no gallop, no edema. VASCULAR: Bilateral 2+ radial pulses.  No bruits. RESPIRATORY:  Clear to auscultation without rales, wheezing or rhonchi  ABDOMEN: Soft, non-tender, non-distended, No pulsatile mass, MUSCULOSKELETAL: No deformity  SKIN: Warm and dry NEUROLOGIC:  Alert and oriented x 3 PSYCHIATRIC:  Normal affect   ASSESSMENT:    1. Coronary artery disease involving native coronary artery of native heart with  angina pectoris ([place])   2. Essential hypertension, benign   3. Other hyperlipidemia    PLAN:    In order of problems listed above:  1. He is doing well.  Denies angina.  He is exercising.  LDL cholesterol when checked recently was 61.  Blood pressure is under excellent control.  Encourage 150 minutes of moderate aerobic activity per week. 2. He is achieve the target of 130/80 or less. 3. LDL target has been achieved.  Most recently LDL was 61.  Aerobic activity encouraged.  Call if angina.  Clinical follow-up in 8 to 12 months.   Medication Adjustments/Labs and Tests Ordered: Current medicines are reviewed at length with the patient today.  Concerns regarding medicines are outlined above.  No orders of the defined types were placed in this encounter.  No orders of the defined types were placed in this encounter.   Patient Instructions  Medication Instructions:  Your physician recommends that you continue on your current medications as directed. Please refer to the Current Medication list given to you today.  If you need a refill on your cardiac medications before your next appointment, please call your pharmacy.   Lab work: NONE If you have labs (blood work) drawn today and your tests are completely normal, you will receive your results only by: Marland Kitchen MyChart Message (if you have MyChart) OR . A paper copy in the mail If you have any lab test that is abnormal or we need to change your treatment, we will call you to review the results.  Testing/Procedures: NONE  Follow-Up: At [facility], you and your health needs are our priority.  As part of our continuing mission to provide you with exceptional heart care, we have created designated Provider Care Teams.  These Care Teams include your primary Cardiologist (physician) and Advanced Practice Providers (APPs -  Physician Assistants and Nurse Practitioners) who all work together to provide you with the care you need, when you  need  it.  Your physician wants you to follow-up in: 8-12 months with Dr. Tamala Julian. You will receive a reminder letter in the mail two months in advance. If you don't receive a letter, please call our office to schedule the follow-up appointment.  Any Other Special Instructions Will Be Listed Below (If Applicable). STAY ACTIVE!      Signed, [name], MD  04/17/2018 12:21 PM    [facility]

## 2018-04-17 ENCOUNTER — Encounter: Payer: Self-pay | Admitting: Interventional Cardiology

## 2018-04-17 ENCOUNTER — Telehealth: Payer: Self-pay | Admitting: Interventional Cardiology

## 2018-04-17 ENCOUNTER — Encounter (INDEPENDENT_AMBULATORY_CARE_PROVIDER_SITE_OTHER): Payer: Self-pay

## 2018-04-17 ENCOUNTER — Ambulatory Visit: Payer: Medicare Other | Admitting: Interventional Cardiology

## 2018-04-17 VITALS — BP 102/82 | HR 74 | Ht 69.0 in | Wt 194.4 lb

## 2018-04-17 DIAGNOSIS — I10 Essential (primary) hypertension: Secondary | ICD-10-CM | POA: Diagnosis not present

## 2018-04-17 DIAGNOSIS — E7849 Other hyperlipidemia: Secondary | ICD-10-CM | POA: Diagnosis not present

## 2018-04-17 DIAGNOSIS — I25119 Atherosclerotic heart disease of native coronary artery with unspecified angina pectoris: Secondary | ICD-10-CM | POA: Diagnosis not present

## 2018-04-17 NOTE — Telephone Encounter (Signed)
New Message:    Patient is requesting a call back to explain what is the meaning of  coronary bypass draft

## 2018-04-17 NOTE — Telephone Encounter (Signed)
Patient called with question about his paperwork that he received this morning mentioning that he had bypass in the past.  He denies this surgery.  I then spoke to Dr Tamala Julian and he said that he will resolve it.  I called patient and gave him the explanation and that it would be corrected.  He thanked [place] for the call.

## 2018-04-17 NOTE — Patient Instructions (Signed)
Medication Instructions:  Your physician recommends that you continue on your current medications as directed. Please refer to the Current Medication list given to you today.  If you need a refill on your cardiac medications before your next appointment, please call your pharmacy.   Lab work: NONE If you have labs (blood work) drawn today and your tests are completely normal, you will receive your results only by: Marland Kitchen MyChart Message (if you have MyChart) OR . A paper copy in the mail If you have any lab test that is abnormal or we need to change your treatment, we will call you to review the results.  Testing/Procedures: NONE  Follow-Up: At [facility], you and your health needs are our priority.  As part of our continuing mission to provide you with exceptional heart care, we have created designated Provider Care Teams.  These Care Teams include your primary Cardiologist (physician) and Advanced Practice Providers (APPs -  Physician Assistants and Nurse Practitioners) who all work together to provide you with the care you need, when you need it.  Your physician wants you to follow-up in: 8-12 months with Dr. Tamala Julian. You will receive a reminder letter in the mail two months in advance. If you don't receive a letter, please call our office to schedule the follow-up appointment.  Any Other Special Instructions Will Be Listed Below (If Applicable). STAY ACTIVE!

## 2018-05-28 ENCOUNTER — Other Ambulatory Visit: Payer: Self-pay | Admitting: Internal Medicine

## 2019-01-27 ENCOUNTER — Other Ambulatory Visit: Payer: Self-pay | Admitting: Nurse Practitioner

## 2019-02-20 NOTE — Progress Notes (Signed)
Virtual Visit via Video Note   This visit type was conducted due to national recommendations for restrictions regarding the COVID-19 Pandemic (e.g. social distancing) in an effort to limit this patient's exposure and mitigate transmission in our community.  Due to his co-morbid illnesses, this patient is at least at moderate risk for complications without adequate follow up.  This format is felt to be most appropriate for this patient at this time.  All issues noted in this document were discussed and addressed.  A limited physical exam was performed with this format.  Please refer to the patient's chart for his consent to telehealth for [facility].   Date:  02/21/2019   ID:  Cala Bradford, DOB 1951-05-18, MRN [id number]  Patient Location: Other:  office Provider Location: Home  PCP:  [name], MD  Cardiologist:  [name], MD  Electrophysiologist:  None   Evaluation Performed:  Follow-Up Visit  Chief Complaint:  CAD  History of Present Illness:    Edward [name] is a 68 y.o. male with CAD.   He has a hx of history of coronary artery disease with last cath 2010 with nonobstructive disease., and again 10/2017 with cardiac cath.  He has gastroesophageal reflux, hyperlipidemia, and hypertension.  Today he has no SOB, he continues with episodes of chest pain and at times takes NTG with relief.  His BP is soft at 938 systolic so no room to increase meds and his losartan has been stopped due to hypotension.  His EKG is stable.  His pain is at rest in bed not with exertion.  He exercises and no pain then.  Dr. Felipa Eth follows cholesterol and pt is on Crestor.  He also has a sharp brief pain Lt ant chest, most likely muscular.  .  The patient does not have symptoms concerning for COVID-19 infection (fever, chills, cough, or new shortness of breath).    Past Medical History:  Diagnosis Date  . Anginal pain ([place])    last NTG 3-4 months ago  . Anxiety    claustrophobic  .  Arthritis    throughout arms, knees & back & neck  . CAD (coronary artery disease)    with high grade obstruction second obtuse marginal and diffuse LAD and circumflex disease by cath 2010  . Cancer ([place])    skin cancer, on R ear - basal cell   . Cold sore    right upper lip, healing well  . Colon polyp   . Elevated cholesterol    LDL Goal < 70  . Erectile dysfunction   . GERD (gastroesophageal reflux disease)    has not had problem several months  . History of nonmelanoma skin cancer   . History of nuclear stress test 5/10   no ischemia  . Hypertension    Past Surgical History:  Procedure Laterality Date  . ANTERIOR CERVICAL DECOMP/DISCECTOMY FUSION N/A 05/29/2013   Procedure: Cervical five-six Anterior cervical decompression/diskectomy/fusion with exploration of Cervical six-seven, hardware removal ;  Surgeon: Erline Levine, MD;  Location: [place] [facility];  Service: Neurosurgery;  Laterality: N/A;  Cervical five-six Anterior cervical decompression/diskectomy/fusion with exploration of Cervical six-seven, Hardware removal   . BACK SURGERY     lower  . CARDIAC CATHETERIZATION     2004 & 2/10, normal LV function  . CERVICAL FUSION    . COLONOSCOPY WITH PROPOFOL N/A 05/14/2014   Procedure: COLONOSCOPY WITH PROPOFOL;  Surgeon: Garlan Fair, MD;  [facility];  Service: Endoscopy;  Laterality: N/A;  . FINGER FRACTURE SURGERY     by Dr. Daylene Katayama  . I&D KNEE WITH POLY EXCHANGE Right 05/08/2016   Procedure: POLY EXCHANGE;  Surgeon: Paralee Cancel, MD;  [facility];  Service: Orthopedics;  Laterality: Right;  . KNEE ARTHROSCOPY  06/13/2011   Procedure: ARTHROSCOPY KNEE;  Surgeon: Tobi Bastos;  [facility];  Service: Orthopedics;  Laterality: Right;  Right Knee Arthroscopy with Medial Menisectomy  . LEFT HEART CATH AND CORONARY ANGIOGRAPHY N/A 10/09/2017   Procedure: LEFT HEART CATH AND CORONARY ANGIOGRAPHY;  Surgeon: Belva Crome, MD;  Location: [place] [facility];   Service: Cardiovascular;  Laterality: N/A;  . MAXIMUM ACCESS (MAS)POSTERIOR LUMBAR INTERBODY FUSION (PLIF) 2 LEVEL N/A 01/04/2017   Procedure: Lumbar four- five Lumbar five-Sacral one Maximum access posterior lumbar interbody fusion;  Surgeon: Erline Levine, MD;  Location: [place];  Service: Neurosurgery;  Laterality: N/A;  Lumbar four- five Lumbar five-Sacral one Maximum access posterior lumbar interbody fusion  . NECK SURGERY  05/29/2013  . SCAR DEBRIDEMENT OF TOTAL KNEE Right 05/08/2016   Procedure: SCAR DEBRIDEMENT OF TOTAL KNEE, EXCISION OF SAPHENOUS NEUROMA;  Surgeon: Paralee Cancel, MD;  [facility];  Service: Orthopedics;  Laterality: Right;  . SHOULDER SURGERY     2 left / x3 right  . SKIN CANCER EXCISION    . SKIN SURGERY     nose and right ear skin cancer surgery  . TOTAL KNEE ARTHROPLASTY Right 03/30/2015   Procedure: RIGHT TOTAL KNEE ARTHROPLASTY;  Surgeon: Latanya Maudlin, MD;  [facility];  Service: Orthopedics;  Laterality: Right;     Current Meds  Medication Sig  . ALPRAZolam (XANAX) 0.5 MG tablet Take 0.5 mg by mouth at bedtime as needed for sleep.   Marland Kitchen aspirin EC 81 MG tablet Take 1 tablet (81 mg total) by mouth daily.  Marland Kitchen HYDROcodone-acetaminophen (NORCO/VICODIN) 5-325 MG tablet Take 1 tablet by mouth every 8 (eight) hours as needed for pain.  Marland Kitchen LYRICA 75 MG capsule Take 75 mg by mouth at bedtime.  . nitroGLYCERIN (NITROSTAT) 0.4 MG SL tablet DISSOLVE 1 TABLET UNDER THE TONGUE EVERY 5 MINUTES AS  NEEDED FOR CHEST PAIN . MAX 3 TABS IN 15 MINUTES & CALL 911 IF CHEST PAIN PERSIST  . pantoprazole (PROTONIX) 40 MG tablet Take 40 mg by mouth daily as needed (for acid reflux).   . rosuvastatin (CRESTOR) 20 MG tablet TAKE 1 TABLET BY MOUTH  DAILY  . tamsulosin (FLOMAX) 0.4 MG CAPS capsule Take 0.4 mg by mouth daily.      Allergies:   Nsaids and Percocet [oxycodone-acetaminophen]   Social History   Tobacco Use  . Smoking status: Former Smoker    Quit date: 07/10/1998     Years since quitting: 20.6  . Smokeless tobacco: Never Used  Substance Use Topics  . Alcohol use: No  . Drug use: No     Family Hx: The patient's family history includes CAD in his sister and sister; CVA in his sister; Heart attack in his mother and sister.  ROS:   Please see the history of present illness.    General:no colds or fevers, no weight changes Skin:no rashes or ulcers HEENT:no blurred vision, no congestion CV:see HPI PUL:see HPI GI:no diarrhea constipation or melena, no indigestion GU:no hematuria, no dysuria MS:no joint pain, no claudication Neuro:no syncope, no lightheadedness Endo:no diabetes, no thyroid disease  All other systems reviewed and are negative.   Prior CV studies:  The following studies were reviewed today:  Cardiac cath 10/2017    Diffuse nonobstructive three-vessel CAD with improvement in LAD diffuse disease compared to prior imaging and new 30% distal left main.  Mild three-vessel coronary calcification  Distal left main with eccentric 20-30% narrowing.  Diffuse proximal to distal LAD irregularities up to 50% narrowing without focal high-grade obstruction.  Generally speaking, the LAD diameter is improved compared to prior imaging.  Diffuse irregularities in the circumflex.  Small first obtuse marginal contains 85% segmental ostial to proximal narrowing unchanged from prior imaging.  Eccentric 40-50% mid RCA unchanged from prior.  Diffuse 30-50% distal disease unchanged from prior.  Overall normal LV function, possible mid anterior wall hypokinesis, EF 55%.  LVEDP 12 mmHg.  RECOMMENDATIONS:   Overall, diffuse coronary disease appears to be improved compared to 10 years ago.  Continue aggressive risk factor modification with LDL less than 70, blood pressure control as needed to keep blood pressure 130/80 mmHg or less, glycemic control, and encourage aerobic activity.  Use nitroglycerin as needed.  Chest pain currently occurs at  random and could be related to endothelial dysfunction with spasm.   Diagnostic Dominance: Right    Labs/Other Tests and Data Reviewed:    EKG:  An ECG dated 02/21/19 was personally reviewed today and demonstrated:  SR normal EKG and resolution of PVCs  Recent Labs: No results found for requested labs within last 8760 hours.   Recent Lipid Panel Lab Results  Component Value Date/Time   CHOL 137 07/09/2015 08:56 AM   TRIG 95 07/09/2015 08:56 AM   HDL 47 07/09/2015 08:56 AM   CHOLHDL 2.9 07/09/2015 08:56 AM   LDLCALC 71 07/09/2015 08:56 AM    Wt Readings from Last 3 Encounters:  02/21/19 185 lb (83.9 kg)  04/17/18 194 lb 6.4 oz (88.2 kg)  10/09/17 194 lb (88 kg)     Objective:    Vital Signs:  BP 112/62   Pulse 64   Ht 5\' 9"  (1.753 m)   Wt 185 lb (83.9 kg)   SpO2 94%   BMI 27.32 kg/m    VITAL SIGNS:  reviewed\General NAD Neuro A&O X 3 MAE follows commands Lungs:  Can speak in complete sentences without SOB.   Psych:  Pleasant affect   ASSESSMENT & PLAN:    1. CAD with occ angina. None with exercise only at rest.  BP is soft so difficult to add daily Imdur.  pt will continue with PRN NTG. 2. HTN controlled  3. HLD followed by Dr. Felipa Eth pt exercises and eats hearlthy  COVID-19 Education: The signs and symptoms of COVID-19 were discussed with the patient and how to seek care for testing (follow up with PCP or arrange E-visit).  The importance of social distancing was discussed today.  Time:   Today, I have spent 15 minutes with the patient with telehealth technology discussing the above problems.     Medication Adjustments/Labs and Tests Ordered: Current medicines are reviewed at length with the patient today.  Concerns regarding medicines are outlined above.   Tests Ordered: No orders of the defined types were placed in this encounter.   Medication Changes: No orders of the defined types were placed in this encounter.   Follow Up:  In Person in 1  year(s)  Signed, Cecilie Kicks, NP  02/21/2019 10:20 AM    [place]

## 2019-02-21 ENCOUNTER — Other Ambulatory Visit: Payer: Self-pay

## 2019-02-21 ENCOUNTER — Ambulatory Visit (INDEPENDENT_AMBULATORY_CARE_PROVIDER_SITE_OTHER): Payer: Medicare Other | Admitting: Cardiology

## 2019-02-21 ENCOUNTER — Encounter: Payer: Self-pay | Admitting: Cardiology

## 2019-02-21 VITALS — BP 112/62 | HR 64 | Ht 69.0 in | Wt 185.0 lb

## 2019-02-21 DIAGNOSIS — I10 Essential (primary) hypertension: Secondary | ICD-10-CM

## 2019-02-21 DIAGNOSIS — I25709 Atherosclerosis of coronary artery bypass graft(s), unspecified, with unspecified angina pectoris: Secondary | ICD-10-CM

## 2019-02-21 DIAGNOSIS — E7849 Other hyperlipidemia: Secondary | ICD-10-CM | POA: Diagnosis not present

## 2019-02-21 NOTE — Patient Instructions (Signed)
Medication Instructions:  Your physician recommends that you continue on your current medications as directed. Please refer to the Current Medication list given to you today.  If you need a refill on your cardiac medications before your next appointment, please call your pharmacy.   Lab work: None ordered  If you have labs (blood work) drawn today and your tests are completely normal, you will receive your results only by: Marland Kitchen MyChart Message (if you have MyChart) OR . A paper copy in the mail If you have any lab test that is abnormal or we need to change your treatment, we will call you to review the results.  Testing/Procedures: None ordered  Follow-Up: At [facility], you and your health needs are our priority.  As part of our continuing mission to provide you with exceptional heart care, we have created designated Provider Care Teams.  These Care Teams include your primary Cardiologist (physician) and Advanced Practice Providers (APPs -  Physician Assistants and Nurse Practitioners) who all work together to provide you with the care you need, when you need it. You will need a follow up appointment in 12 months.  Please call our office 2 months in advance to schedule this appointment.  You may see [name], MD or one of the following Advanced Practice Providers on your designated Care Team:   Truitt Merle, NP Cecilie Kicks, NP . Kathyrn Drown, NP  Any Other Special Instructions Will Be Listed Below (If Applicable).

## 2019-05-16 ENCOUNTER — Other Ambulatory Visit: Payer: Self-pay | Admitting: Internal Medicine

## 2020-03-18 ENCOUNTER — Encounter (INDEPENDENT_AMBULATORY_CARE_PROVIDER_SITE_OTHER): Payer: Self-pay

## 2020-03-18 ENCOUNTER — Other Ambulatory Visit: Payer: Self-pay | Admitting: Internal Medicine

## 2020-03-18 ENCOUNTER — Encounter: Payer: Self-pay | Admitting: Interventional Cardiology

## 2020-03-18 ENCOUNTER — Ambulatory Visit (INDEPENDENT_AMBULATORY_CARE_PROVIDER_SITE_OTHER): Payer: Medicare Other | Admitting: Interventional Cardiology

## 2020-03-18 ENCOUNTER — Other Ambulatory Visit: Payer: Self-pay

## 2020-03-18 VITALS — BP 116/68 | HR 76 | Ht 69.0 in | Wt 190.0 lb

## 2020-03-18 DIAGNOSIS — I10 Essential (primary) hypertension: Secondary | ICD-10-CM

## 2020-03-18 DIAGNOSIS — Z7189 Other specified counseling: Secondary | ICD-10-CM

## 2020-03-18 DIAGNOSIS — E7849 Other hyperlipidemia: Secondary | ICD-10-CM | POA: Diagnosis not present

## 2020-03-18 DIAGNOSIS — I25119 Atherosclerotic heart disease of native coronary artery with unspecified angina pectoris: Secondary | ICD-10-CM

## 2020-03-18 NOTE — Progress Notes (Signed)
Cardiology Office Note:    Date:  03/18/2020   ID:  Edward [name], DOB May 24, 1951, MRN [id number]  PCP:  [name], MD  Cardiologist:  [name], MD   Referring MD: [name], MD   Chief Complaint  Patient presents with  . Coronary Artery Disease  . Hyperlipidemia    History of Present Illness:    Edward [name] is a 69 y.o. male with a hx of coronary artery disease with last cath 2010 with nonobstructive disease., gastroesophageal reflux, hyperlipidemia, and hypertension.  Edward [name] has angina.  The pattern is been stable.  Edward [name] has always had angina both at rest and with physical activity with variable threshold.  Over the years the exertional component has become more consistent.  Edward [name] denies neurological complaints.  Edward [name] is not having claudication.  Past Medical History:  Diagnosis Date  . Anginal pain ([place])    last NTG 3-4 months ago  . Anxiety    claustrophobic  . Arthritis    throughout arms, knees & back & neck  . CAD (coronary artery disease)    with high grade obstruction second obtuse marginal and diffuse LAD and circumflex disease by cath 2010  . Cancer ([place])    skin cancer, on R ear - basal cell   . Cold sore    right upper lip, healing well  . Colon polyp   . Elevated cholesterol    LDL Goal < 70  . Erectile dysfunction   . GERD (gastroesophageal reflux disease)    has not had problem several months  . History of nonmelanoma skin cancer   . History of nuclear stress test 5/10   no ischemia  . Hypertension     Past Surgical History:  Procedure Laterality Date  . ANTERIOR CERVICAL DECOMP/DISCECTOMY FUSION N/A 05/29/2013   Procedure: Cervical five-six Anterior cervical decompression/diskectomy/fusion with exploration of Cervical six-seven, hardware removal ;  Surgeon: Erline Levine, MD;  Location: [facility];  Service: Neurosurgery;  Laterality: N/A;  Cervical five-six Anterior cervical decompression/diskectomy/fusion with exploration of  Cervical six-seven, Hardware removal   . BACK SURGERY     lower  . CARDIAC CATHETERIZATION     2004 & 2/10, normal LV function  . CERVICAL FUSION    . COLONOSCOPY WITH PROPOFOL N/A 05/14/2014   Procedure: COLONOSCOPY WITH PROPOFOL;  Surgeon: Garlan Fair, MD;  Location: [facility];  Service: Endoscopy;  Laterality: N/A;  . FINGER FRACTURE SURGERY     by Dr. Daylene Katayama  . I & D KNEE WITH POLY EXCHANGE Right 05/08/2016   Procedure: POLY EXCHANGE;  Surgeon: Paralee Cancel, MD;  [facility]: [facility];  Service: Orthopedics;  Laterality: Right;  . KNEE ARTHROSCOPY  06/13/2011   Procedure: ARTHROSCOPY KNEE;  Surgeon: Tobi Bastos;  [facility]: [facility];  Service: Orthopedics;  Laterality: Right;  Right Knee Arthroscopy with Medial Menisectomy  . LEFT HEART CATH AND CORONARY ANGIOGRAPHY N/A 10/09/2017   Procedure: LEFT HEART CATH AND CORONARY ANGIOGRAPHY;  Surgeon: Belva Crome, MD;  Location: [facility];  Service: Cardiovascular;  Laterality: N/A;  . MAXIMUM ACCESS (MAS)POSTERIOR LUMBAR INTERBODY FUSION (PLIF) 2 LEVEL N/A 01/04/2017   Procedure: Lumbar four- five Lumbar five-Sacral one Maximum access posterior lumbar interbody fusion;  Surgeon: Erline Levine, MD;  Location: [facility];  Service: Neurosurgery;  Laterality: N/A;  Lumbar four- five Lumbar five-Sacral one Maximum access posterior lumbar interbody fusion  . NECK SURGERY  05/29/2013  . SCAR DEBRIDEMENT OF TOTAL  KNEE Right 05/08/2016   Procedure: SCAR DEBRIDEMENT OF TOTAL KNEE, EXCISION OF SAPHENOUS NEUROMA;  Surgeon: Paralee Cancel, MD;  [facility];  Service: Orthopedics;  Laterality: Right;  . SHOULDER SURGERY     2 left / x3 right  . SKIN CANCER EXCISION    . SKIN SURGERY     nose and right ear skin cancer surgery  . TOTAL KNEE ARTHROPLASTY Right 03/30/2015   Procedure: RIGHT TOTAL KNEE ARTHROPLASTY;  Surgeon: Latanya Maudlin, MD;  [facility];  Service: Orthopedics;  Laterality: Right;    Current Medications: Current Meds    Medication Sig  . ALPRAZolam (XANAX) 0.5 MG tablet Take 0.5 mg by mouth at bedtime as needed for sleep.   Marland Kitchen aspirin EC 81 MG tablet Take 1 tablet (81 mg total) by mouth daily.  Marland Kitchen HYDROcodone-acetaminophen (NORCO/VICODIN) 5-325 MG tablet Take 1 tablet by mouth every 8 (eight) hours as needed for pain.  Marland Kitchen LYRICA 75 MG capsule Take 75 mg by mouth at bedtime.  . Multiple Vitamin (MULTIVITAMIN) tablet Take 1 tablet by mouth daily.  . nitroGLYCERIN (NITROSTAT) 0.4 MG SL tablet DISSOLVE 1 TABLET UNDER THE TONGUE EVERY 5 MINUTES AS  NEEDED FOR CHEST PAIN . MAX 3 TABS IN 15 MINUTES & CALL 911 IF CHEST PAIN PERSIST  . pantoprazole (PROTONIX) 40 MG tablet Take 40 mg by mouth daily as needed (for acid reflux).   . rosuvastatin (CRESTOR) 20 MG tablet TAKE 1 TABLET BY MOUTH  DAILY  . tamsulosin (FLOMAX) 0.4 MG CAPS capsule Take 0.4 mg by mouth daily.      Allergies:   Nsaids and Percocet [oxycodone-acetaminophen]   Social History   Socioeconomic History  . Marital status: Divorced    Spouse name: Not on file  . Number of children: Not on file  . Years of education: Not on file  . Highest education level: Not on file  Occupational History  . Not on file  Tobacco Use  . Smoking status: Former Smoker    Quit date: 07/10/1998    Years since quitting: 21.7  . Smokeless tobacco: Never Used  Vaping Use  . Vaping Use: Never used  Substance and Sexual Activity  . Alcohol use: No  . Drug use: No  . Sexual activity: Not on file  Other Topics Concern  . Not on file  Social History Narrative  . Not on file   Social Determinants of Health   Financial Resource Strain:   . Difficulty of Paying Living Expenses: Not on file  Food Insecurity:   . Worried About Charity fundraiser in the Last Year: Not on file  . Ran Out of Food in the Last Year: Not on file  Transportation Needs:   . Lack of Transportation (Medical): Not on file  . Lack of Transportation (Non-Medical): Not on file  Physical  Activity:   . Days of Exercise per Week: Not on file  . Minutes of Exercise per Session: Not on file  Stress:   . Feeling of Stress : Not on file  Social Connections:   . Frequency of Communication with Friends and Family: Not on file  . Frequency of Social Gatherings with Friends and Family: Not on file  . Attends Religious Services: Not on file  . Active Member of Clubs or Organizations: Not on file  . Attends Archivist Meetings: Not on file  . Marital Status: Not on file     Family History: The patient's family history  includes CAD in his sister and sister; CVA in his sister; Heart attack in his mother and sister.  ROS:   Please see the history of present illness.    Limited by significant musculoskeletal problems including right knee that may need to be replaced for third time, low back and neck problems, bilateral shoulder problems, but despite this remains with a significant level of physical activity.  All other systems reviewed and are negative.  EKGs/Labs/Other Studies Reviewed:    The following studies were reviewed today: No new imaging data.  EKG:  EKG sinus rhythm with normal appearance.  1 PVC is noted.  When compared to the prior tracing performed in August 2020, no significant changes occurred.  Recent Labs: No results found for requested labs within last 8760 hours.  Recent Lipid Panel    Component Value Date/Time   CHOL 137 07/09/2015 0856   TRIG 95 07/09/2015 0856   HDL 47 07/09/2015 0856   CHOLHDL 2.9 07/09/2015 0856   VLDL 19 07/09/2015 0856   LDLCALC 71 07/09/2015 0856    Physical Exam:    VS:  BP 116/68   Pulse 76   Ht 5\' 9"  (1.753 m)   Wt 190 lb (86.2 kg)   BMI 28.06 kg/m     Wt Readings from Last 3 Encounters:  03/18/20 190 lb (86.2 kg)  02/21/19 185 lb (83.9 kg)  04/17/18 194 lb 6.4 oz (88.2 kg)     GEN: Edward [name] is wearing a gator.  I can see that Edward [name] has a beard which is new.. No acute distress HEENT: Normal NECK: No  JVD. LYMPHATICS: No lymphadenopathy CARDIAC:  RRR without murmur, gallop, or edema. VASCULAR:  Normal Pulses. No bruits. RESPIRATORY:  Clear to auscultation without rales, wheezing or rhonchi  ABDOMEN: Soft, non-tender, non-distended, No pulsatile mass, MUSCULOSKELETAL: No deformity  SKIN: Warm and dry NEUROLOGIC:  Alert and oriented x 3 PSYCHIATRIC:  Normal affect   ASSESSMENT:    1. Coronary artery disease involving native coronary artery of native heart with angina pectoris ([place])   2. Essential hypertension   3. Other hyperlipidemia   4. Educated about COVID-19 virus infection    PLAN:    In order of problems listed above:  1. Secondary prevention discussed including the importance of greater than 150 minutes of moderate activity per week. 2. Controlled on current therapy which includes exercise and salt restriction.  Edward [name] is not on antihypertensive agent per se. 3. Continue Crestor 40 mg/day.  Most recent LDL was 66.  Upcoming blood work with Dr. Felipa Eth. 4. Edward [name] is vaccinated and practicing mitigation.  Overall education and awareness concerning primary/secondary risk prevention was discussed in detail: LDL less than 70, hemoglobin A1c less than 7, blood pressure target less than 130/80 mmHg, >150 minutes of moderate aerobic activity per week, avoidance of smoking, weight control (via diet and exercise), and continued surveillance/management of/for obstructive sleep apnea.    Medication Adjustments/Labs and Tests Ordered: Current medicines are reviewed at length with the patient today.  Concerns regarding medicines are outlined above.  Orders Placed This Encounter  Procedures  . EKG 12-Lead   No orders of the defined types were placed in this encounter.   Patient Instructions  Medication Instructions:  Your physician recommends that you continue on your current medications as directed. Please refer to the Current Medication list given to you today.  *If you need a  refill on your cardiac medications before your next appointment, please call your pharmacy*  Lab Work: None If you have labs (blood work) drawn today and your tests are completely normal, you will receive your results only by: Marland Kitchen MyChart Message (if you have MyChart) OR . A paper copy in the mail If you have any lab test that is abnormal or we need to change your treatment, we will call you to review the results.   Testing/Procedures: None   Follow-Up: At [facility], you and your health needs are our priority.  As part of our continuing mission to provide you with exceptional heart care, we have created designated Provider Care Teams.  These Care Teams include your primary Cardiologist (physician) and Advanced Practice Providers (APPs -  Physician Assistants and Nurse Practitioners) who all work together to provide you with the care you need, when you need it.  We recommend signing up for the patient portal called "MyChart".  Sign up information is provided on this After Visit Summary.  MyChart is used to connect with patients for Virtual Visits (Telemedicine).  Patients are able to view lab/test results, encounter notes, upcoming appointments, etc.  Non-urgent messages can be sent to your provider as well.   To learn more about what you can do with MyChart, go to [email or web address].    Your next appointment:   12 month(s)  The format for your next appointment:   In Person  Provider:   You may see [name], MD or one of the following Advanced Practice Providers on your designated Care Team:    Truitt Merle, NP  Cecilie Kicks, NP  Kathyrn Drown, NP    Other Instructions      Signed, [name], MD  03/18/2020 1:08 PM    [place]

## 2020-03-18 NOTE — Patient Instructions (Signed)

## 2020-03-29 ENCOUNTER — Ambulatory Visit: Payer: Medicare Other | Admitting: Interventional Cardiology

## 2020-04-23 ENCOUNTER — Telehealth: Payer: Self-pay | Admitting: Interventional Cardiology

## 2020-04-23 NOTE — Telephone Encounter (Signed)
Pt states he received Rosuvastatin a few days ago and then received Atorvastatin.  Advised we have Rosuvastatin listed and to contact [facility] about what to do in regards to the Atorvastatin they sent.  Pt in agreement with plan.

## 2020-04-23 NOTE — Telephone Encounter (Signed)
     I went in  Pt's chart to see what medicine was called in for pt

## 2020-04-23 NOTE — Telephone Encounter (Signed)
New message:    Patient calling concering some medications. Patient is already on rosuvastatin but Atorvastatin medication was sent to him. Patient thinks that was mistake. Please call patient.

## 2020-08-25 DIAGNOSIS — Z5181 Encounter for therapeutic drug level monitoring: Secondary | ICD-10-CM | POA: Diagnosis not present

## 2020-08-25 DIAGNOSIS — Z79899 Other long term (current) drug therapy: Secondary | ICD-10-CM | POA: Diagnosis not present

## 2020-08-25 DIAGNOSIS — G894 Chronic pain syndrome: Secondary | ICD-10-CM | POA: Diagnosis not present

## 2020-08-25 DIAGNOSIS — M961 Postlaminectomy syndrome, not elsewhere classified: Secondary | ICD-10-CM | POA: Diagnosis not present

## 2020-09-09 DIAGNOSIS — M5416 Radiculopathy, lumbar region: Secondary | ICD-10-CM | POA: Diagnosis not present

## 2020-09-20 DIAGNOSIS — L570 Actinic keratosis: Secondary | ICD-10-CM | POA: Diagnosis not present

## 2020-09-20 DIAGNOSIS — D485 Neoplasm of uncertain behavior of skin: Secondary | ICD-10-CM | POA: Diagnosis not present

## 2020-11-09 DIAGNOSIS — I2510 Atherosclerotic heart disease of native coronary artery without angina pectoris: Secondary | ICD-10-CM | POA: Diagnosis not present

## 2020-11-09 DIAGNOSIS — I209 Angina pectoris, unspecified: Secondary | ICD-10-CM | POA: Diagnosis not present

## 2020-11-09 DIAGNOSIS — I10 Essential (primary) hypertension: Secondary | ICD-10-CM | POA: Diagnosis not present

## 2020-12-22 DIAGNOSIS — M545 Low back pain, unspecified: Secondary | ICD-10-CM | POA: Diagnosis not present

## 2020-12-22 DIAGNOSIS — M25562 Pain in left knee: Secondary | ICD-10-CM | POA: Diagnosis not present

## 2020-12-22 DIAGNOSIS — M25561 Pain in right knee: Secondary | ICD-10-CM | POA: Diagnosis not present

## 2021-02-18 ENCOUNTER — Other Ambulatory Visit: Payer: Self-pay | Admitting: Interventional Cardiology

## 2021-04-11 DIAGNOSIS — M25561 Pain in right knee: Secondary | ICD-10-CM | POA: Diagnosis not present

## 2021-04-11 DIAGNOSIS — M961 Postlaminectomy syndrome, not elsewhere classified: Secondary | ICD-10-CM | POA: Diagnosis not present

## 2021-04-15 DIAGNOSIS — M5416 Radiculopathy, lumbar region: Secondary | ICD-10-CM | POA: Insufficient documentation

## 2021-05-10 ENCOUNTER — Other Ambulatory Visit: Payer: Self-pay | Admitting: Interventional Cardiology

## 2021-05-10 DIAGNOSIS — M5416 Radiculopathy, lumbar region: Secondary | ICD-10-CM | POA: Diagnosis not present

## 2021-05-20 DIAGNOSIS — Z0000 Encounter for general adult medical examination without abnormal findings: Secondary | ICD-10-CM | POA: Diagnosis not present

## 2021-05-20 DIAGNOSIS — K219 Gastro-esophageal reflux disease without esophagitis: Secondary | ICD-10-CM | POA: Diagnosis not present

## 2021-05-20 DIAGNOSIS — Z79899 Other long term (current) drug therapy: Secondary | ICD-10-CM | POA: Diagnosis not present

## 2021-05-20 DIAGNOSIS — I10 Essential (primary) hypertension: Secondary | ICD-10-CM | POA: Diagnosis not present

## 2021-05-20 DIAGNOSIS — Z1389 Encounter for screening for other disorder: Secondary | ICD-10-CM | POA: Diagnosis not present

## 2021-05-20 DIAGNOSIS — I209 Angina pectoris, unspecified: Secondary | ICD-10-CM | POA: Diagnosis not present

## 2021-05-20 DIAGNOSIS — E7800 Pure hypercholesterolemia, unspecified: Secondary | ICD-10-CM | POA: Diagnosis not present

## 2021-05-24 DIAGNOSIS — L814 Other melanin hyperpigmentation: Secondary | ICD-10-CM | POA: Diagnosis not present

## 2021-05-24 DIAGNOSIS — D2261 Melanocytic nevi of right upper limb, including shoulder: Secondary | ICD-10-CM | POA: Diagnosis not present

## 2021-05-24 DIAGNOSIS — D225 Melanocytic nevi of trunk: Secondary | ICD-10-CM | POA: Diagnosis not present

## 2021-05-24 DIAGNOSIS — B351 Tinea unguium: Secondary | ICD-10-CM | POA: Diagnosis not present

## 2021-05-24 DIAGNOSIS — L821 Other seborrheic keratosis: Secondary | ICD-10-CM | POA: Diagnosis not present

## 2021-05-24 DIAGNOSIS — Z85828 Personal history of other malignant neoplasm of skin: Secondary | ICD-10-CM | POA: Diagnosis not present

## 2021-05-24 DIAGNOSIS — D2272 Melanocytic nevi of left lower limb, including hip: Secondary | ICD-10-CM | POA: Diagnosis not present

## 2021-05-24 DIAGNOSIS — L570 Actinic keratosis: Secondary | ICD-10-CM | POA: Diagnosis not present

## 2021-06-22 NOTE — Progress Notes (Signed)
Cardiology Office Note   Date:  06/23/2021   ID:  ARMANY MANO, DOB 07/04/1951, MRN [id number]  PCP:  [name], MD  Cardiologist:  Dr. Tamala Julian    Chief Complaint  Patient presents with   Follow-up      History of Present Illness: Edward [name] is a 70 y.o. male who presents for CAD,   hx of coronary artery disease with last cath 2019 with nonobstructive disease., gastroesophageal reflux, hyperlipidemia, and hypertension.   Edward [name] has hx. angina.  The pattern is been stable.  He has always had angina both at rest and with physical activity with variable threshold.  Over the years the exertional component has become more consistent.  Lipids followed by Dr. Felipa Eth.  Last LDL 72 and HDL 51.  05/2021.   Today he is doing well, though he admits to chest pain usually at rest.    No associated symptoms.  He will take NTG and it does help.  On last cath mild disease including 30% distal LM.  Eccentric 40-50% mRCA EF 55%.  No SOB with exertion.  Does have some extra heart beats at times.  Fast rapid beats but does not last long. .    Past Medical History:  Diagnosis Date   Anginal pain ([place])    last NTG 3-4 months ago   Anxiety    claustrophobic   Arthritis    throughout arms, knees & back & neck   CAD (coronary artery disease)    with high grade obstruction second obtuse marginal and diffuse LAD and circumflex disease by cath 2010   Cancer [place])    skin cancer, on R ear - basal cell    Cold sore    right upper lip, healing well   Colon polyp    Elevated cholesterol    LDL Goal < 70   Erectile dysfunction    GERD (gastroesophageal reflux disease)    has not had problem several months   History of nonmelanoma skin cancer    History of nuclear stress test 5/10   no ischemia   Hypertension     Past Surgical History:  Procedure Laterality Date   ANTERIOR CERVICAL DECOMP/DISCECTOMY FUSION N/A 05/29/2013   Procedure: Cervical five-six Anterior cervical  decompression/diskectomy/fusion with exploration of Cervical six-seven, hardware removal ;  Surgeon: Erline Levine, MD;  Location: [facility];  Service: Neurosurgery;  Laterality: N/A;  Cervical five-six Anterior cervical decompression/diskectomy/fusion with exploration of Cervical six-seven, Hardware removal    BACK SURGERY     lower   CARDIAC CATHETERIZATION     2004 & 2/10, normal LV function   CERVICAL FUSION     COLONOSCOPY WITH PROPOFOL N/A 05/14/2014   Procedure: COLONOSCOPY WITH PROPOFOL;  Surgeon: Garlan Fair, MD;  [facility];  Service: Endoscopy;  Laterality: N/A;   FINGER FRACTURE SURGERY     by Dr. Daylene Katayama   I & D KNEE WITH POLY EXCHANGE Right 05/08/2016   Procedure: POLY EXCHANGE;  Surgeon: Paralee Cancel, MD;  [facility];  Service: Orthopedics;  Laterality: Right;   KNEE ARTHROSCOPY  06/13/2011   Procedure: ARTHROSCOPY KNEE;  Surgeon: Tobi Bastos;  [facility];  Service: Orthopedics;  Laterality: Right;  Right Knee Arthroscopy with Medial Menisectomy   LEFT HEART CATH AND CORONARY ANGIOGRAPHY N/A 10/09/2017   Procedure: LEFT HEART CATH AND CORONARY ANGIOGRAPHY;  Surgeon: Belva Crome, MD;  [facility];  Service: Cardiovascular;  Laterality: N/A;  MAXIMUM ACCESS (MAS)POSTERIOR LUMBAR INTERBODY FUSION (PLIF) 2 LEVEL N/A 01/04/2017   Procedure: Lumbar four- five Lumbar five-Sacral one Maximum access posterior lumbar interbody fusion;  Surgeon: Erline Levine, MD;  Location: [facility];  Service: Neurosurgery;  Laterality: N/A;  Lumbar four- five Lumbar five-Sacral one Maximum access posterior lumbar interbody fusion   NECK SURGERY  05/29/2013   SCAR DEBRIDEMENT OF TOTAL KNEE Right 05/08/2016   Procedure: SCAR DEBRIDEMENT OF TOTAL KNEE, EXCISION OF SAPHENOUS NEUROMA;  Surgeon: Paralee Cancel, MD;  [facility];  Service: Orthopedics;  Laterality: Right;   SHOULDER SURGERY     2 left / x3 right   SKIN CANCER EXCISION     SKIN SURGERY      nose and right ear skin cancer surgery   TOTAL KNEE ARTHROPLASTY Right 03/30/2015   Procedure: RIGHT TOTAL KNEE ARTHROPLASTY;  Surgeon: Latanya Maudlin, MD;  [facility];  Service: Orthopedics;  Laterality: Right;     Current Outpatient Medications  Medication Sig Dispense Refill   ALPRAZolam (XANAX) 0.5 MG tablet Take 0.5 mg by mouth at bedtime as needed for sleep.      aspirin EC 81 MG tablet Take 1 tablet (81 mg total) by mouth daily. 90 tablet 3   HYDROcodone-acetaminophen (NORCO/VICODIN) 5-325 MG tablet Take 1 tablet by mouth every 8 (eight) hours as needed for pain.     LYRICA 75 MG capsule Take 75 mg by mouth at bedtime.     metoprolol tartrate (LOPRESSOR) 50 MG tablet TAKE 1 TABLET BY MOUTH 2 HOURS PRIOR TO THE CARDIAC CT 1 tablet 0   Multiple Vitamin (MULTIVITAMIN) tablet Take 1 tablet by mouth daily.     nitroGLYCERIN (NITROSTAT) 0.4 MG SL tablet DISSOLVE 1 TABLET UNDER THE TONGUE EVERY 5 MINUTES AS  NEEDED FOR CHEST PAIN . MAX 3 TABS IN 15 MINUTES &amp; CALL 911 IF CHEST PAIN PERSIST 25 tablet 3   pantoprazole (PROTONIX) 40 MG tablet Take 40 mg by mouth daily as needed (for acid reflux).      rosuvastatin (CRESTOR) 20 MG tablet TAKE 1 TABLET BY MOUTH  DAILY 90 tablet 0   tamsulosin (FLOMAX) 0.4 MG CAPS capsule Take 0.4 mg by mouth daily.      No current facility-administered medications for this visit.    Allergies:   Nsaids and Percocet [oxycodone-acetaminophen]    Social History:  The patient  reports that he quit smoking about 22 years ago. His smoking use included cigarettes. He has never used smokeless tobacco. He reports that he does not drink alcohol and does not use drugs.   Family History:  The patient's family history includes CAD in his sister and sister; CVA in his sister; Heart attack in his mother and sister.    ROS:  General:no colds or fevers, no weight changes Skin:no rashes or ulcers HEENT:no blurred vision, no congestion CV:see HPI PUL:see HPI GI:no  diarrhea constipation or melena, no indigestion GU:no hematuria, no dysuria MS:no joint pain, no claudication Neuro:no syncope, no lightheadedness Endo:no diabetes, no thyroid disease  Wt Readings from Last 3 Encounters:  06/23/21 193 lb (87.5 kg)  03/18/20 190 lb (86.2 kg)  02/21/19 185 lb (83.9 kg)     PHYSICAL EXAM: VS:  BP 100/60    Pulse 67    Ht 5\' 9"  (1.753 m)    Wt 193 lb (87.5 kg)    SpO2 98%    BMI 28.50 kg/m  , BMI Body mass index is 28.5 kg/m. General:Pleasant affect, NAD  Skin:Warm and dry, brisk capillary refill HEENT:normocephalic, sclera clear, mucus membranes moist Neck:supple, no JVD, no bruits  Heart:S1S2 RRR without murmur, gallup, rub or click Lungs:clear without rales, rhonchi, or wheezes VCB:SWHQ, non tender, + BS, do not palpate liver spleen or masses Ext:no lower ext edema, 2+ pedal pulses, 2+ radial pulses Neuro:alert and oriented, MAE, follows commands, + facial symmetry    EKG:  EKG is ordered today. The ekg ordered today demonstrates SR at 70 no acute changes though low voltage in inf leads.    Recent Labs: No results found for requested labs within last 8760 hours.    Lipid Panel    Component Value Date/Time   CHOL 137 07/09/2015 0856   TRIG 95 07/09/2015 0856   HDL 47 07/09/2015 0856   CHOLHDL 2.9 07/09/2015 0856   VLDL 19 07/09/2015 0856   LDLCALC 71 07/09/2015 0856       Other studies Reviewed: Additional studies/ records that were reviewed today include: . Cardiac cath 10/09/17 Diffuse nonobstructive three-vessel CAD with improvement in LAD diffuse disease compared to prior imaging and new 30% distal left main. Mild three-vessel coronary calcification Distal left main with eccentric 20-30% narrowing. Diffuse proximal to distal LAD irregularities up to 50% narrowing without focal high-grade obstruction.  Generally speaking, the LAD diameter is improved compared to prior imaging. Diffuse irregularities in the circumflex.  Small  first obtuse marginal contains 85% segmental ostial to proximal narrowing unchanged from prior imaging. Eccentric 40-50% mid RCA unchanged from prior.  Diffuse 30-50% distal disease unchanged from prior. Overall normal LV function, possible mid anterior wall hypokinesis, EF 55%.  LVEDP 12 mmHg.   RECOMMENDATIONS:   Overall, diffuse coronary disease appears to be improved compared to 10 years ago. Continue aggressive risk factor modification with LDL less than 70, blood pressure control as needed to keep blood pressure 130/80 mmHg or less, glycemic control, and encourage aerobic activity. Use nitroglycerin as needed.  Chest pain currently occurs at random and could be related to endothelial dysfunction with spasm. Diagnostic Dominance: Right Intervention  ASSESSMENT AND PLAN:  1.  Angina/chest pain improved with NTG occurs at rest but does not increase with activity.  No associated symptoms.  We discussed tests and we agreed cardiac CTA may be most helpful to eval his non obstructive disease.  Will plan and only give 50 lopressor due to BP 100/60.  HR was 70 on EKG.    2.  CAD non obstructive in 2019.- BP on soft side on no BP lowering meds   3.  HLD on crestor 20 mg daily.  Followed by Dr. Felipa Eth.   Last LDL 72 with goal of less than 50.  Depending on cardiac CTA, he may need new goals of LDL < 55.  Review of last OV and labs from Dr. Felipa Eth.   4. Palpitations - short bursts, once cardiac CTA complete will do event monitor unless cath is needed. Then would hold off until after cath.       Current medicines are reviewed with the patient today.  The patient Has no concerns regarding medicines.  The following changes have been made:  See above Labs/ tests ordered today include:see above  Disposition:   FU:  see above  Signed, Cecilie Kicks, NP  06/23/2021 10:24 AM    [place], [place], [place], [place] Phone: [phone]; Fax: [phone]

## 2021-06-23 ENCOUNTER — Encounter: Payer: Self-pay | Admitting: Cardiology

## 2021-06-23 ENCOUNTER — Other Ambulatory Visit: Payer: Self-pay

## 2021-06-23 ENCOUNTER — Ambulatory Visit: Payer: Medicare Other | Admitting: Cardiology

## 2021-06-23 VITALS — BP 100/60 | HR 67 | Ht 69.0 in | Wt 193.0 lb

## 2021-06-23 DIAGNOSIS — E7849 Other hyperlipidemia: Secondary | ICD-10-CM

## 2021-06-23 DIAGNOSIS — R002 Palpitations: Secondary | ICD-10-CM | POA: Diagnosis not present

## 2021-06-23 DIAGNOSIS — I10 Essential (primary) hypertension: Secondary | ICD-10-CM | POA: Diagnosis not present

## 2021-06-23 DIAGNOSIS — I25119 Atherosclerotic heart disease of native coronary artery with unspecified angina pectoris: Secondary | ICD-10-CM

## 2021-06-23 DIAGNOSIS — I200 Unstable angina: Secondary | ICD-10-CM | POA: Diagnosis not present

## 2021-06-23 LAB — BASIC METABOLIC PANEL
BUN/Creatinine Ratio: 13 (ref 10–24)
BUN: 9 mg/dL (ref 8–27)
CO2: 24 mmol/L (ref 20–29)
Calcium: 9 mg/dL (ref 8.6–10.2)
Chloride: 102 mmol/L (ref 96–106)
Creatinine, Ser: 0.72 mg/dL — ABNORMAL LOW (ref 0.76–1.27)
Glucose: 76 mg/dL (ref 70–99)
Potassium: 4.1 mmol/L (ref 3.5–5.2)
Sodium: 140 mmol/L (ref 134–144)
eGFR: 100 mL/min/{1.73_m2} (ref >59)

## 2021-06-23 MED ORDER — METOPROLOL TARTRATE 50 MG PO TABS: ORAL_TABLET | ORAL | 0 refills | Status: DC

## 2021-06-23 NOTE — Patient Instructions (Addendum)
Medication Instructions:  Your physician recommends that you continue on your current medications as directed. Please refer to the Current Medication list given to you today.  *If you need a refill on your cardiac medications before your next appointment, please call your pharmacy*   Lab Work: TODAY:  BMET  If you have labs (blood work) drawn today and your tests are completely normal, you will receive your results only by: [place] (if you have MyChart) OR A paper copy in the mail If you have any lab test that is abnormal or we need to change your treatment, we will call you to review the results.   Testing/Procedures: Your physician has requested that you have cardiac CT. Cardiac computed tomography (CT) is a painless test that uses an x-ray machine to take clear, detailed pictures of your heart. For further information please visit [email or web address]. Please follow instruction sheet as BELOW:     [facility] [address] [place], [place] [address] [phone]   Please arrive at the [facility] main entrance (entrance A) of [facility] 30 minutes prior to test start time. You can use the FREE valet parking offered at the main entrance (encouraged to control the heart rate for the test) Proceed to the [facility] Radiology Department (first floor) to check-in and test prep.   Please follow these instructions carefully (unless otherwise directed):  Hold all erectile dysfunction medications at least 3 days (72 hrs) prior to test.  On the Night Before the Test: Be sure to Drink plenty of water. Do not consume any caffeinated/decaffeinated beverages or chocolate 12 hours prior to your test. Do not take any antihistamines 12 hours prior to your test.   On the Day of the Test: Drink plenty of water until 1 hour prior to the test. Do not eat any food 4 hours prior to the test. You may take your regular medications prior to the test.  Take  metoprolol (Lopressor) 50 mg two hours prior to test. THIS [facility]      After the Test: Drink plenty of water. After receiving IV contrast, you may experience a mild flushed feeling. This is normal. On occasion, you may experience a mild rash up to 24 hours after the test. This is not dangerous. If this occurs, you can take Benadryl 25 mg and increase your fluid intake. If you experience trouble breathing, this can be serious. If it is severe call 911 IMMEDIATELY. If it is mild, please call our office. If you take any of these medications: Glipizide/Metformin, Avandament, Glucavance, please do not take 48 hours after completing test unless otherwise instructed.  Please allow 2-4 weeks for scheduling of routine cardiac CTs. Some insurance companies require a pre-authorization which may delay scheduling of this test.   For non-scheduling related questions, please contact the cardiac imaging nurse navigator should you have any questions/concerns: Marchia Bond, Cardiac Imaging Nurse Navigator Gordy Clement, Cardiac Imaging Nurse Navigator [facility] Direct Office Dial: [phone]   For scheduling needs, including cancellations and rescheduling, please call [place], [phone].      Follow-Up: At [facility], you and your health needs are our priority.  As part of our continuing mission to provide you with exceptional heart care, we have created designated Provider Care Teams.  These Care Teams include your primary Cardiologist (physician) and Advanced Practice Providers (APPs -  Physician Assistants and Nurse Practitioners) who all work together to provide you with  the care you need, when you need it.  We recommend signing up for the patient portal called "MyChart".  Sign up information is provided on this After Visit Summary.  MyChart is used to connect with patients for Virtual Visits (Telemedicine).  Patients are able to view lab/test  results, encounter notes, upcoming appointments, etc.  Non-urgent messages can be sent to your provider as well.   To learn more about what you can do with MyChart, go to [email or web address].    Your next appointment:   12 month(s)  The format for your next appointment:   In Person  Provider:   [name], MD  or Melina Copa, PA-C, Cecilie Kicks, NP, Ermalinda Barrios, PA-C, or Richardson Dopp, PA-C         Other Instructions

## 2021-06-24 NOTE — Progress Notes (Signed)
Pt has been made aware of normal result and verbalized understanding.  jw

## 2021-07-06 ENCOUNTER — Telehealth (HOSPITAL_COMMUNITY): Payer: Self-pay | Admitting: Emergency Medicine

## 2021-07-06 NOTE — Telephone Encounter (Signed)
Reaching out to patient to offer assistance regarding upcoming cardiac imaging study; pt verbalizes understanding of appt date/time, parking situation and where to check in, pre-test NPO status and medications ordered, and verified current allergies; name and call back number provided for further questions should they arise Edward Bond RN Navigator Cardiac Imaging [name] [phone] office [phone] cell  Denies iv issues Arrival 830 50mg  metoprolol tart

## 2021-07-08 ENCOUNTER — Other Ambulatory Visit: Payer: Self-pay

## 2021-07-08 ENCOUNTER — Ambulatory Visit (HOSPITAL_COMMUNITY)
Admission: RE | Admit: 2021-07-08 | Discharge: 2021-07-08 | Disposition: A | Discharge status: Home / Self Care | Payer: Medicare Other | Source: Ambulatory Visit | Attending: Internal Medicine | Admitting: Internal Medicine

## 2021-07-08 ENCOUNTER — Other Ambulatory Visit: Payer: Self-pay | Admitting: Internal Medicine

## 2021-07-08 ENCOUNTER — Ambulatory Visit (HOSPITAL_COMMUNITY)
Admission: RE | Admit: 2021-07-08 | Discharge: 2021-07-08 | Disposition: A | Discharge status: Home / Self Care | Payer: Medicare Other | Source: Ambulatory Visit | Attending: Cardiology | Admitting: Cardiology

## 2021-07-08 DIAGNOSIS — R931 Abnormal findings on diagnostic imaging of heart and coronary circulation: Secondary | ICD-10-CM

## 2021-07-08 DIAGNOSIS — I700 Atherosclerosis of aorta: Secondary | ICD-10-CM | POA: Insufficient documentation

## 2021-07-08 DIAGNOSIS — I517 Cardiomegaly: Secondary | ICD-10-CM | POA: Insufficient documentation

## 2021-07-08 DIAGNOSIS — R002 Palpitations: Secondary | ICD-10-CM | POA: Insufficient documentation

## 2021-07-08 DIAGNOSIS — I2510 Atherosclerotic heart disease of native coronary artery without angina pectoris: Secondary | ICD-10-CM

## 2021-07-08 MED ORDER — IOHEXOL 350 MG/ML SOLN
100.0000 mL | Freq: Once | INTRAVENOUS | Status: AC | PRN
  Administered 2021-07-08: 09:00:00 100 mL via INTRAVENOUS

## 2021-07-08 MED ORDER — NITROGLYCERIN 0.4 MG SL SUBL
SUBLINGUAL_TABLET | SUBLINGUAL | Status: AC
  Filled 2021-07-08: qty 2

## 2021-07-08 MED ORDER — NITROGLYCERIN 0.4 MG SL SUBL
0.8000 mg | SUBLINGUAL_TABLET | Freq: Once | SUBLINGUAL | Status: AC
  Administered 2021-07-08: 09:00:00 0.8 mg via SUBLINGUAL

## 2021-07-08 NOTE — Progress Notes (Signed)
Please send CT for FFR - Dr. Laquon Emel 

## 2021-07-12 DIAGNOSIS — Z79891 Long term (current) use of opiate analgesic: Secondary | ICD-10-CM | POA: Diagnosis not present

## 2021-07-12 DIAGNOSIS — Z5181 Encounter for therapeutic drug level monitoring: Secondary | ICD-10-CM | POA: Diagnosis not present

## 2021-07-12 DIAGNOSIS — G894 Chronic pain syndrome: Secondary | ICD-10-CM | POA: Diagnosis not present

## 2021-07-12 DIAGNOSIS — M25512 Pain in left shoulder: Secondary | ICD-10-CM | POA: Diagnosis not present

## 2021-07-15 ENCOUNTER — Other Ambulatory Visit: Payer: Self-pay | Admitting: *Deleted

## 2021-07-15 MED ORDER — METOPROLOL SUCCINATE ER 25 MG PO TB24: 12.5000 mg | ORAL_TABLET | Freq: Every day | ORAL | 3 refills | Status: DC

## 2021-08-04 ENCOUNTER — Other Ambulatory Visit: Payer: Self-pay | Admitting: Interventional Cardiology

## 2021-08-04 NOTE — Telephone Encounter (Signed)
Ok to fill 

## 2021-08-24 ENCOUNTER — Telehealth: Payer: Self-pay | Admitting: Interventional Cardiology

## 2021-08-24 NOTE — Telephone Encounter (Signed)
Called and spoke to patient who states that since his last visit in December with Cecilie Kicks and being placed on Metoprolol, he has continued to have intermittent CP with need to take NTG. Pt does state that the pain has gotten better, but he dislikes that since beginning medication he often feels tired and cold. Pt requesting to proceed with cardiac cath, instead of medication. Will forward to Dr Tamala Julian for advisement.

## 2021-08-24 NOTE — Telephone Encounter (Signed)
Pt c/o of Chest Pain: STAT if CP now or developed within 24 hours  1. Are you having CP right now? no  2. Are you experiencing any other symptoms (ex. SOB, nausea, vomiting, sweating)? no  3. How long have you been experiencing CP? For a few weeks  4. Is your CP continuous or coming and going? Comes and goes   5. Have you taken Nitroglycerin? Yes  Patient had follow up appt on 08/29/21, he can't make it in this day as he is positive for COVID.     Pt c/o medication issue:  1. Name of Medication: metoprolol succinate (TOPROL XL) 25 MG 24 hr tablet  2. How are you currently taking this medication (dosage and times per day)? Take 0.5 tablets (12.5 mg total) by mouth daily  3. Are you having a reaction (difficulty breathing--STAT)?   4. What is your medication issue? Patient states it's making him feel tired, give him a headache, and he feels cold.    ?

## 2021-08-24 NOTE — Telephone Encounter (Signed)
Called pt to schedule him to see Dr. Tamala Julian to possibly set up cath.  Pt states he tested positive for covid this morning.  Advised we would have to wait at least 5 full days before we bring him in.  Scheduled pt to see Dr. Tamala Julian 2/21.  Pt appreciative for assistance.  He is aware to call if any changes with covid.

## 2021-08-29 ENCOUNTER — Ambulatory Visit: Payer: Medicare Other | Admitting: Interventional Cardiology

## 2021-08-29 NOTE — Progress Notes (Signed)
Cardiology Office Note:    Date:  08/30/2021   ID:  Edward [name], DOB [birth year], MRN [id number]  PCP:  [name], MD  Cardiologist:  [name], MD   Referring MD: [name], MD   Chief Complaint  Patient presents with   Coronary Artery Disease   Chest Pain    History of Present Illness:    Edward [name] is a 71 y.o. male with a hx of coronary artery disease with last cath 2010 with nonobstructive disease., gastroesophageal reflux, hyperlipidemia, and hypertension.  Recent increasing chest pain, and decision made by patient and others that patient should undergo coronary angiography.  Coronary CT is consistent with prior coronary angiography.  "Called and spoke to patient who states that since his last visit in December with Cecilie Kicks and being placed on Metoprolol, he has continued to have intermittent CP with need to take NTG. Pt does state that the pain has gotten better, but he dislikes that since beginning medication he often feels tired and cold. Pt requesting to proceed with cardiac cath, instead of medication. Will forward to Dr Tamala Julian for advisement".  Coronary CTA on December 30 demonstrated high-grade obstruction in the midportion of moderate size first diagonal.  Recently, he has been having spontaneous episodes of chest discomfort that respond to nitroglycerin.  Episodes have been less frequent since starting low-dose metoprolol, however he is having intolerance related to headache, lethargy, and feeling cold.  Nitroglycerin causes headaches.  He is limiting his physical activity.  Coronary CTA with FFR demonstrates high-grade obstruction in the second diagonal which is a relatively small branch when reviewing prior angiography.  The vessel does not appear to be large enough to stent when reviewing prior digital images.  Distal RCA, distal circumflex, and distal LAD are not imaged..  Past Medical History:  Diagnosis Date   Anginal pain ([place])    last NTG  3-4 months ago   Anxiety    claustrophobic   Arthritis    throughout arms, knees & back & neck   CAD (coronary artery disease)    with high grade obstruction second obtuse marginal and diffuse LAD and circumflex disease by cath 2010   Cancer [facility])    skin cancer, on R ear - basal cell    Cold sore    right upper lip, healing well   Colon polyp    Elevated cholesterol    LDL Goal < 70   Erectile dysfunction    GERD (gastroesophageal reflux disease)    has not had problem several months   History of nonmelanoma skin cancer    History of nuclear stress test 5/10   no ischemia   Hypertension     Past Surgical History:  Procedure Laterality Date   ANTERIOR CERVICAL DECOMP/DISCECTOMY FUSION N/A 05/29/2013   Procedure: Cervical five-six Anterior cervical decompression/diskectomy/fusion with exploration of Cervical six-seven, hardware removal ;  Surgeon: Erline Levine, MD;  Location: [facility];  Service: Neurosurgery;  Laterality: N/A;  Cervical five-six Anterior cervical decompression/diskectomy/fusion with exploration of Cervical six-seven, Hardware removal    BACK SURGERY     lower   CARDIAC CATHETERIZATION     2004 & 2/10, normal LV function   CERVICAL FUSION     COLONOSCOPY WITH PROPOFOL N/A 05/14/2014   Procedure: COLONOSCOPY WITH PROPOFOL;  Surgeon: Garlan Fair, MD;  Location: [facility];  Service: Endoscopy;  Laterality: N/A;   FINGER FRACTURE SURGERY     by Dr. Daylene Katayama  I & D KNEE WITH POLY EXCHANGE Right 05/08/2016   Procedure: POLY EXCHANGE;  Surgeon: Paralee Cancel, MD;  Location: [facility];  Service: Orthopedics;  Laterality: Right;   KNEE ARTHROSCOPY  06/13/2011   Procedure: ARTHROSCOPY KNEE;  Surgeon: Tobi Bastos;  Location: [facility];  Service: Orthopedics;  Laterality: Right;  Right Knee Arthroscopy with Medial Menisectomy   LEFT HEART CATH AND CORONARY ANGIOGRAPHY N/A 10/09/2017   Procedure: LEFT HEART CATH AND CORONARY ANGIOGRAPHY;  Surgeon: Belva Crome, MD;   Location: [place] [facility];  Service: Cardiovascular;  Laterality: N/A;   MAXIMUM ACCESS (MAS)POSTERIOR LUMBAR INTERBODY FUSION (PLIF) 2 LEVEL N/A 01/04/2017   Procedure: Lumbar four- five Lumbar five-Sacral one Maximum access posterior lumbar interbody fusion;  Surgeon: Erline Levine, MD;  Location: [place];  Service: Neurosurgery;  Laterality: N/A;  Lumbar four- five Lumbar five-Sacral one Maximum access posterior lumbar interbody fusion   NECK SURGERY  05/29/2013   SCAR DEBRIDEMENT OF TOTAL KNEE Right 05/08/2016   Procedure: SCAR DEBRIDEMENT OF TOTAL KNEE, EXCISION OF SAPHENOUS NEUROMA;  Surgeon: Paralee Cancel, MD;  Location: [facility];  Service: Orthopedics;  Laterality: Right;   SHOULDER SURGERY     2 left / x3 right   SKIN CANCER EXCISION     SKIN SURGERY     nose and right ear skin cancer surgery   TOTAL KNEE ARTHROPLASTY Right 03/30/2015   Procedure: RIGHT TOTAL KNEE ARTHROPLASTY;  Surgeon: Latanya Maudlin, MD;  Location: [facility];  Service: Orthopedics;  Laterality: Right;    Current Medications: Current Meds  Medication Sig   ALPRAZolam (XANAX) 0.5 MG tablet Take 0.5 mg by mouth at bedtime as needed for sleep.    aspirin EC 81 MG tablet Take 1 tablet (81 mg total) by mouth daily.   HYDROcodone-acetaminophen (NORCO/VICODIN) 5-325 MG tablet Take 1 tablet by mouth every 8 (eight) hours as needed for pain.   LYRICA 75 MG capsule Take 75 mg by mouth at bedtime.   metoprolol succinate (TOPROL XL) 25 MG 24 hr tablet Take 0.5 tablets (12.5 mg total) by mouth daily.   Multiple Vitamin (MULTIVITAMIN) tablet Take 1 tablet by mouth daily.   nitroGLYCERIN (NITROSTAT) 0.4 MG SL tablet DISSOLVE 1 TABLET UNDER THE TONGUE EVERY 5 MINUTES AS  NEEDED FOR CHEST PAIN . MAX 3 TABS IN 15 MINUTES &amp; CALL 911 IF CHEST PAIN PERSIST   pantoprazole (PROTONIX) 40 MG tablet Take 40 mg by mouth daily as needed (for acid reflux).    rosuvastatin (CRESTOR) 20 MG tablet TAKE 1 TABLET BY MOUTH DAILY   tamsulosin  (FLOMAX) 0.4 MG CAPS capsule Take 0.4 mg by mouth daily.    VITAMIN D PO Take 5,000 Units by mouth daily.     Allergies:   Nsaids and Percocet [oxycodone-acetaminophen]   Social History   Socioeconomic History   Marital status: Divorced    Spouse name: Not on file   Number of children: Not on file   Years of education: Not on file   Highest education level: Not on file  Occupational History   Not on file  Tobacco Use   Smoking status: Former    Types: Cigarettes    Quit date: 07/10/1998    Years since quitting: 23.1   Smokeless tobacco: Never  Vaping Use   Vaping Use: Never used  Substance and Sexual Activity   Alcohol use: No   Drug use: No   Sexual activity: Not on file  Other Topics Concern  Not on file  Social History Narrative   Not on file   Social Determinants of Health   Financial Resource Strain: Not on file  Food Insecurity: Not on file  Transportation Needs: Not on file  Physical Activity: Not on file  Stress: Not on file  Social Connections: Not on file     Family History: The patient's family history includes CAD in his sister and sister; CVA in his sister; Heart attack in his mother and sister.  ROS:   Please see the history of present illness.    Right knee pain is can concerning for the patient.  He has had replacement x2.  All other systems reviewed and are negative.  EKGs/Labs/Other Studies Reviewed:    The following studies were reviewed today:  Coronary CTA 07/08/2021: IMPRESSION: 1. Mild to moderate CAD, possibly more significant, CADRADS = 3. CT FFR will be performed and reported separately given high calcium score.   2. Coronary calcium score of 755. This was 83rd percentile for age and sex matched control.   3. Normal coronary origin with right dominance.   4. Dilated main pulmonary artery at 31 mm, suggestive of pulmonary hypertension.   5. Aortic atherosclerosis.  Coronary CT FFR 07/08/2021:   1. Left Main:  No  significant stenosis. FFR = 0.99   2. LAD: No significant stenosis. Proximal FFR = 0.93, Mid FFR = 0.84, Distal FFR = 0.75 3. Second diagonal branch: Proximal FFR = 0.90, Distal FFR = 0.60 4. LCX: No significant stenosis. Proximal FFR = 0.94, Distal FFR = 0.83 5. RCA: No significant stenosis. Proximal FFR = 0.99, Mid FFR = 0.89, Distal FFR = 0.84 6. R-PLB branch: Significant stenosis - Mid FFR 0.79   IMPRESSION: 1. CT FFR does show significant stenosis of the second diagonal branch, mid to distal LAD and R-PLB branches.   2.  Definitive cardiac catheterization is recommended.   EKG:  EKG sinus rhythm, nonspecific T wave flattening, relatively short PR interval.   Compared to prior tracing from 06/23/2021, no changes noted  Recent Labs: 06/23/2021: BUN 9; Creatinine, Ser 0.72; Potassium 4.1; Sodium 140  Recent Lipid Panel    Component Value Date/Time   CHOL 137 07/09/2015 0856   TRIG 95 07/09/2015 0856   HDL 47 07/09/2015 0856   CHOLHDL 2.9 07/09/2015 0856   VLDL 19 07/09/2015 0856   LDLCALC 71 07/09/2015 0856    Physical Exam:    VS:  BP 118/70    Pulse 67    Ht 5\' 9"  (1.753 m)    Wt 193 lb 6.4 oz (87.7 kg)    SpO2 98%    BMI 28.56 kg/m     Wt Readings from Last 3 Encounters:  08/30/21 193 lb 6.4 oz (87.7 kg)  06/23/21 193 lb (87.5 kg)  03/18/20 190 lb (86.2 kg)     GEN: Appears healthy. No acute distress HEENT: Normal NECK: No JVD. LYMPHATICS: No lymphadenopathy CARDIAC: No murmur. RRR no gallop, or edema. VASCULAR:  Normal Pulses. No bruits. RESPIRATORY:  Clear to auscultation without rales, wheezing or rhonchi  ABDOMEN: Soft, non-tender, non-distended, No pulsatile mass, MUSCULOSKELETAL: No deformity  SKIN: Warm and dry NEUROLOGIC:  Alert and oriented x 3 PSYCHIATRIC:  Normal affect   ASSESSMENT:    1. Coronary artery disease involving native coronary artery of native heart with angina pectoris ([place])   2. Essential hypertension   3. Other hyperlipidemia    4. Palpitations    PLAN:    In  order of problems listed above:  Angina pectoris with impact on patient's quality of life despite medical therapy.  Medication intolerance as noted above.  Plan to proceed with coronary angiography to define anatomy and determine if there is an interventional approach that might help decrease the burden of angina.  Prior angio and recent CT FFR images suggest diffuse three-vessel coronary disease.  He may need to have a microvascular dysfunction study.  The patient was counseled to undergo left heart catheterization, coronary angiography, and possible percutaneous coronary intervention with stent implantation. The procedural risks and benefits were discussed in detail. The risks discussed included death, stroke, myocardial infarction, life-threatening bleeding, limb ischemia, kidney injury, allergy, and possible emergency cardiac surgery. The risk of these significant complications were estimated to occur less than 1% of the time. After discussion, the patient has agreed to proceed.  Consider microvascular dysfunction study in addition.    Medication Adjustments/Labs and Tests Ordered: Current medicines are reviewed at length with the patient today.  Concerns regarding medicines are outlined above.  No orders of the defined types were placed in this encounter.  No orders of the defined types were placed in this encounter.   There are no Patient Instructions on file for this visit.   Signed, [name], MD  08/30/2021 10:17 AM    [place]

## 2021-08-29 NOTE — H&P (View-Only) (Signed)
Cardiology Office Note:    Date:  08/30/2021   ID:  Edward [name], DOB 04-28-1951, MRN [id number]  PCP:  [name], MD  Cardiologist:  [name], MD   Referring MD: [name], MD   Chief Complaint  Patient presents with   Coronary Artery Disease   Chest Pain    History of Present Illness:    Edward [name] is a 71 y.o. male with a hx of coronary artery disease with last cath 2010 with nonobstructive disease., gastroesophageal reflux, hyperlipidemia, and hypertension.  Recent increasing chest pain, and decision made by patient and others that patient should undergo coronary angiography.  Coronary CT is consistent with prior coronary angiography.  "Called and spoke to patient who states that since his last visit in December with Cecilie Kicks and being placed on Metoprolol, he has continued to have intermittent CP with need to take NTG. Pt does state that the pain has gotten better, but he dislikes that since beginning medication he often feels tired and cold. Pt requesting to proceed with cardiac cath, instead of medication. Will forward to Dr Tamala Julian for advisement".  Coronary CTA on December 30 demonstrated high-grade obstruction in the midportion of moderate size first diagonal.  Recently, he has been having spontaneous episodes of chest discomfort that respond to nitroglycerin.  Episodes have been less frequent since starting low-dose metoprolol, however he is having intolerance related to headache, lethargy, and feeling cold.  Nitroglycerin causes headaches.  He is limiting his physical activity.  Coronary CTA with FFR demonstrates high-grade obstruction in the second diagonal which is a relatively small branch when reviewing prior angiography.  The vessel does not appear to be large enough to stent when reviewing prior digital images.  Distal RCA, distal circumflex, and distal LAD are not imaged..  Past Medical History:  Diagnosis Date   Anginal pain ([place])    last NTG  3-4 months ago   Anxiety    claustrophobic   Arthritis    throughout arms, knees & back & neck   CAD (coronary artery disease)    with high grade obstruction second obtuse marginal and diffuse LAD and circumflex disease by cath 2010   Cancer [facility])    skin cancer, on R ear - basal cell    Cold sore    right upper lip, healing well   Colon polyp    Elevated cholesterol    LDL Goal < 70   Erectile dysfunction    GERD (gastroesophageal reflux disease)    has not had problem several months   History of nonmelanoma skin cancer    History of nuclear stress test 5/10   no ischemia   Hypertension     Past Surgical History:  Procedure Laterality Date   ANTERIOR CERVICAL DECOMP/DISCECTOMY FUSION N/A 05/29/2013   Procedure: Cervical five-six Anterior cervical decompression/diskectomy/fusion with exploration of Cervical six-seven, hardware removal ;  Surgeon: Erline Levine, MD;  Location: [facility];  Service: Neurosurgery;  Laterality: N/A;  Cervical five-six Anterior cervical decompression/diskectomy/fusion with exploration of Cervical six-seven, Hardware removal    BACK SURGERY     lower   CARDIAC CATHETERIZATION     2004 & 2/10, normal LV function   CERVICAL FUSION     COLONOSCOPY WITH PROPOFOL N/A 05/14/2014   Procedure: COLONOSCOPY WITH PROPOFOL;  Surgeon: Garlan Fair, MD;  Location: [facility];  Service: Endoscopy;  Laterality: N/A;   FINGER FRACTURE SURGERY     by Dr. Daylene Katayama  I & D KNEE WITH POLY EXCHANGE Right 05/08/2016   Procedure: POLY EXCHANGE;  Surgeon: Paralee Cancel, MD;  Location: [facility];  Service: Orthopedics;  Laterality: Right;   KNEE ARTHROSCOPY  06/13/2011   Procedure: ARTHROSCOPY KNEE;  Surgeon: Tobi Bastos;  Location: [facility];  Service: Orthopedics;  Laterality: Right;  Right Knee Arthroscopy with Medial Menisectomy   LEFT HEART CATH AND CORONARY ANGIOGRAPHY N/A 10/09/2017   Procedure: LEFT HEART CATH AND CORONARY ANGIOGRAPHY;  Surgeon: Belva Crome, MD;   Location: [facility];  Service: Cardiovascular;  Laterality: N/A;   MAXIMUM ACCESS (MAS)POSTERIOR LUMBAR INTERBODY FUSION (PLIF) 2 LEVEL N/A 01/04/2017   Procedure: Lumbar four- five Lumbar five-Sacral one Maximum access posterior lumbar interbody fusion;  Surgeon: Erline Levine, MD;  Location: [facility];  Service: Neurosurgery;  Laterality: N/A;  Lumbar four- five Lumbar five-Sacral one Maximum access posterior lumbar interbody fusion   NECK SURGERY  05/29/2013   SCAR DEBRIDEMENT OF TOTAL KNEE Right 05/08/2016   Procedure: SCAR DEBRIDEMENT OF TOTAL KNEE, EXCISION OF SAPHENOUS NEUROMA;  Surgeon: Paralee Cancel, MD;  Location: [facility];  Service: Orthopedics;  Laterality: Right;   SHOULDER SURGERY     2 left / x3 right   SKIN CANCER EXCISION     SKIN SURGERY     nose and right ear skin cancer surgery   TOTAL KNEE ARTHROPLASTY Right 03/30/2015   Procedure: RIGHT TOTAL KNEE ARTHROPLASTY;  Surgeon: Latanya Maudlin, MD;  Location: [facility];  Service: Orthopedics;  Laterality: Right;    Current Medications: Current Meds  Medication Sig   ALPRAZolam (XANAX) 0.5 MG tablet Take 0.5 mg by mouth at bedtime as needed for sleep.    aspirin EC 81 MG tablet Take 1 tablet (81 mg total) by mouth daily.   HYDROcodone-acetaminophen (NORCO/VICODIN) 5-325 MG tablet Take 1 tablet by mouth every 8 (eight) hours as needed for pain.   LYRICA 75 MG capsule Take 75 mg by mouth at bedtime.   metoprolol succinate (TOPROL XL) 25 MG 24 hr tablet Take 0.5 tablets (12.5 mg total) by mouth daily.   Multiple Vitamin (MULTIVITAMIN) tablet Take 1 tablet by mouth daily.   nitroGLYCERIN (NITROSTAT) 0.4 MG SL tablet DISSOLVE 1 TABLET UNDER THE TONGUE EVERY 5 MINUTES AS  NEEDED FOR CHEST PAIN . MAX 3 TABS IN 15 MINUTES &amp; CALL 911 IF CHEST PAIN PERSIST   pantoprazole (PROTONIX) 40 MG tablet Take 40 mg by mouth daily as needed (for acid reflux).    rosuvastatin (CRESTOR) 20 MG tablet TAKE 1 TABLET BY MOUTH DAILY   tamsulosin  (FLOMAX) 0.4 MG CAPS capsule Take 0.4 mg by mouth daily.    VITAMIN D PO Take 5,000 Units by mouth daily.     Allergies:   Nsaids and Percocet [oxycodone-acetaminophen]   Social History   Socioeconomic History   Marital status: Divorced    Spouse name: Not on file   Number of children: Not on file   Years of education: Not on file   Highest education level: Not on file  Occupational History   Not on file  Tobacco Use   Smoking status: Former    Types: Cigarettes    Quit date: 07/10/1998    Years since quitting: 23.1   Smokeless tobacco: Never  Vaping Use   Vaping Use: Never used  Substance and Sexual Activity   Alcohol use: No   Drug use: No   Sexual activity: Not on file  Other Topics Concern  Not on file  Social History Narrative   Not on file   Social Determinants of Health   Financial Resource Strain: Not on file  Food Insecurity: Not on file  Transportation Needs: Not on file  Physical Activity: Not on file  Stress: Not on file  Social Connections: Not on file     Family History: The patient's family history includes CAD in his sister and sister; CVA in his sister; Heart attack in his mother and sister.  ROS:   Please see the history of present illness.    Right knee pain is can concerning for the patient.  He has had replacement x2.  All other systems reviewed and are negative.  EKGs/Labs/Other Studies Reviewed:    The following studies were reviewed today:  Coronary CTA 07/08/2021: IMPRESSION: 1. Mild to moderate CAD, possibly more significant, CADRADS = 3. CT FFR will be performed and reported separately given high calcium score.   2. Coronary calcium score of 755. This was 83rd percentile for age and sex matched control.   3. Normal coronary origin with right dominance.   4. Dilated main pulmonary artery at 31 mm, suggestive of pulmonary hypertension.   5. Aortic atherosclerosis.  Coronary CT FFR 07/08/2021:   1. Left Main:  No  significant stenosis. FFR = 0.99   2. LAD: No significant stenosis. Proximal FFR = 0.93, Mid FFR = 0.84, Distal FFR = 0.75 3. Second diagonal branch: Proximal FFR = 0.90, Distal FFR = 0.60 4. LCX: No significant stenosis. Proximal FFR = 0.94, Distal FFR = 0.83 5. RCA: No significant stenosis. Proximal FFR = 0.99, Mid FFR = 0.89, Distal FFR = 0.84 6. R-PLB branch: Significant stenosis - Mid FFR 0.79   IMPRESSION: 1. CT FFR does show significant stenosis of the second diagonal branch, mid to distal LAD and R-PLB branches.   2.  Definitive cardiac catheterization is recommended.   EKG:  EKG sinus rhythm, nonspecific T wave flattening, relatively short PR interval.   Compared to prior tracing from 06/23/2021, no changes noted  Recent Labs: 06/23/2021: BUN 9; Creatinine, Ser 0.72; Potassium 4.1; Sodium 140  Recent Lipid Panel    Component Value Date/Time   CHOL 137 07/09/2015 0856   TRIG 95 07/09/2015 0856   HDL 47 07/09/2015 0856   CHOLHDL 2.9 07/09/2015 0856   VLDL 19 07/09/2015 0856   LDLCALC 71 07/09/2015 0856    Physical Exam:    VS:  BP 118/70    Pulse 67    Ht 5\' 9"  (1.753 m)    Wt 193 lb 6.4 oz (87.7 kg)    SpO2 98%    BMI 28.56 kg/m     Wt Readings from Last 3 Encounters:  08/30/21 193 lb 6.4 oz (87.7 kg)  06/23/21 193 lb (87.5 kg)  03/18/20 190 lb (86.2 kg)     GEN: Appears healthy. No acute distress HEENT: Normal NECK: No JVD. LYMPHATICS: No lymphadenopathy CARDIAC: No murmur. RRR no gallop, or edema. VASCULAR:  Normal Pulses. No bruits. RESPIRATORY:  Clear to auscultation without rales, wheezing or rhonchi  ABDOMEN: Soft, non-tender, non-distended, No pulsatile mass, MUSCULOSKELETAL: No deformity  SKIN: Warm and dry NEUROLOGIC:  Alert and oriented x 3 PSYCHIATRIC:  Normal affect   ASSESSMENT:    1. Coronary artery disease involving native coronary artery of native heart with angina pectoris ([place])   2. Essential hypertension   3. Other hyperlipidemia    4. Palpitations    PLAN:    In  order of problems listed above:  Angina pectoris with impact on patient's quality of life despite medical therapy.  Medication intolerance as noted above.  Plan to proceed with coronary angiography to define anatomy and determine if there is an interventional approach that might help decrease the burden of angina.  Prior angio and recent CT FFR images suggest diffuse three-vessel coronary disease.  He may need to have a microvascular dysfunction study.  The patient was counseled to undergo left heart catheterization, coronary angiography, and possible percutaneous coronary intervention with stent implantation. The procedural risks and benefits were discussed in detail. The risks discussed included death, stroke, myocardial infarction, life-threatening bleeding, limb ischemia, kidney injury, allergy, and possible emergency cardiac surgery. The risk of these significant complications were estimated to occur less than 1% of the time. After discussion, the patient has agreed to proceed.  Consider microvascular dysfunction study in addition.    Medication Adjustments/Labs and Tests Ordered: Current medicines are reviewed at length with the patient today.  Concerns regarding medicines are outlined above.  No orders of the defined types were placed in this encounter.  No orders of the defined types were placed in this encounter.   There are no Patient Instructions on file for this visit.   Signed, [name], MD  08/30/2021 10:17 AM    [place]

## 2021-08-30 ENCOUNTER — Other Ambulatory Visit: Payer: Self-pay

## 2021-08-30 ENCOUNTER — Encounter: Payer: Self-pay | Admitting: Interventional Cardiology

## 2021-08-30 ENCOUNTER — Ambulatory Visit: Payer: Medicare Other | Admitting: Interventional Cardiology

## 2021-08-30 VITALS — BP 118/70 | HR 67 | Ht 69.0 in | Wt 193.4 lb

## 2021-08-30 DIAGNOSIS — R002 Palpitations: Secondary | ICD-10-CM | POA: Diagnosis not present

## 2021-08-30 DIAGNOSIS — E7849 Other hyperlipidemia: Secondary | ICD-10-CM | POA: Diagnosis not present

## 2021-08-30 DIAGNOSIS — I10 Essential (primary) hypertension: Secondary | ICD-10-CM | POA: Diagnosis not present

## 2021-08-30 DIAGNOSIS — I25119 Atherosclerotic heart disease of native coronary artery with unspecified angina pectoris: Secondary | ICD-10-CM

## 2021-08-30 LAB — BASIC METABOLIC PANEL
BUN/Creatinine Ratio: 15 (ref 10–24)
BUN: 11 mg/dL (ref 8–27)
CO2: 27 mmol/L (ref 20–29)
Calcium: 9.1 mg/dL (ref 8.6–10.2)
Chloride: 103 mmol/L (ref 96–106)
Creatinine, Ser: 0.75 mg/dL — ABNORMAL LOW (ref 0.76–1.27)
Glucose: 111 mg/dL — ABNORMAL HIGH (ref 70–99)
Potassium: 4.6 mmol/L (ref 3.5–5.2)
Sodium: 140 mmol/L (ref 134–144)
eGFR: 98 mL/min/{1.73_m2} (ref >59)

## 2021-08-30 LAB — CBC
Hematocrit: 42.2 % (ref 37.5–51.0)
Hemoglobin: 14.2 g/dL (ref 13.0–17.7)
MCH: 30.3 pg (ref 26.6–33.0)
MCHC: 33.6 g/dL (ref 31.5–35.7)
MCV: 90 fL (ref 79–97)
Platelets: 183 10*3/uL (ref 150–450)
RBC: 4.68 x10E6/uL (ref 4.14–5.80)
RDW: 12.9 % (ref 11.6–15.4)
WBC: 5 10*3/uL (ref 3.4–10.8)

## 2021-08-30 NOTE — Patient Instructions (Signed)
Medication Instructions:  Your physician recommends that you continue on your current medications as directed. Please refer to the Current Medication list given to you today.  *If you need a refill on your cardiac medications before your next appointment, please call your pharmacy*   Lab Work: TODAY: BMET, CBC If you have labs (blood work) drawn today and your tests are completely normal, you will receive your results only by: [place] (if you have MyChart) OR A paper copy in the mail If you have any lab test that is abnormal or we need to change your treatment, we will call you to review the results.   Testing/Procedures: Your physician has requested that you have a cardiac catheterization. Cardiac catheterization is used to diagnose and/or treat various heart conditions. Doctors may recommend this procedure for a number of different reasons. The most common reason is to evaluate chest pain. Chest pain can be a symptom of coronary artery disease (CAD), and cardiac catheterization can show whether plaque is narrowing or blocking your hearts arteries. This procedure is also used to evaluate the valves, as well as measure the blood flow and oxygen levels in different parts of your heart. For further information please visit [email or web address]. Please follow instruction sheet, as given.    Follow-Up: At [facility], you and your health needs are our priority.  As part of our continuing mission to provide you with exceptional heart care, we have created designated Provider Care Teams.  These Care Teams include your primary Cardiologist (physician) and Advanced Practice Providers (APPs -  Physician Assistants and Nurse Practitioners) who all work together to provide you with the care you need, when you need it.  We recommend signing up for the patient portal called "MyChart".  Sign up information is provided on this After Visit Summary.  MyChart is used to connect with patients for  Virtual Visits (Telemedicine).  Patients are able to view lab/test results, encounter notes, upcoming appointments, etc.  Non-urgent messages can be sent to your provider as well.   To learn more about what you can do with MyChart, go to [email or web address].    Your next appointment:   2-3 week(s) after cath  The format for your next appointment:   In Person  Provider:   [name], MD     Other Instructions  [place] OFFICE [place], [place] [address] Dept: [phone] Loc: [place]  08/30/2021  You are scheduled for a Cardiac Catheterization on Tuesday, February 28 with Dr. Daneen Schick.  1. Please arrive at the [facility] (Main Entrance A) at [facility]: [address] [place],  [id number] at 5:30 AM (This time is two hours before your procedure to ensure your preparation). Free valet parking service is available.   Special note: Every effort is made to have your procedure done on time. Please understand that emergencies sometimes delay scheduled procedures.  2. Diet: Do not eat solid foods after midnight.  The patient may have clear liquids until 5am upon the day of the procedure.  3. Labs: You will have labs drawn today.  4. Medication instructions in preparation for your procedure:   Contrast Allergy: No  On the morning of your procedure, take your Aspirin and any morning medicines NOT listed above.  You may use sips of water.  5. Plan for one night stay--bring personal belongings. 6. Bring a current list  of your medications and current insurance cards. 7. You MUST have a responsible person to drive you home. 8. Someone MUST be with you the first 24 hours after you arrive home or your discharge will be delayed. 9. Please wear clothes that are easy to get on and off and wear slip-on shoes.  Thank you for allowing [place] to care  for you!   -- [facility]

## 2021-09-05 ENCOUNTER — Telehealth: Payer: Self-pay | Admitting: *Deleted

## 2021-09-05 NOTE — Telephone Encounter (Signed)
Cardiac catheterization scheduled at [facility] for: Tuesday September 06, 2021 7:30 [facility] Main Entrance A [facility]) at: 5:30 AM   Diet-no solid food after midnight prior to cath, clear liquids until 5 AM day of procedure.  Medication instructions for procedure: -Usual morning medications can be taken pre-cath with sips of water including aspirin 81 mg.    Must have responsible adult to drive home post procedure and be with patient first 24 hours after arriving home.  [facility] does allow one visitor to wait in the waiting room during the time you are there.   Patient reports does not currently have any new symptoms concerning for COVID-19 and no household members with COVID-19 like illness.    Reviewed procedure instructions with patient.

## 2021-09-05 NOTE — H&P (Signed)
Has exertional and nonexertional angina.  Coronary CTA model based upon FFR demonstrates tight mid diffusely diseased first diagonal.  No other focal high-grade stenosis noted.

## 2021-09-06 ENCOUNTER — Encounter (HOSPITAL_COMMUNITY): Payer: Self-pay | Admitting: Interventional Cardiology

## 2021-09-06 ENCOUNTER — Other Ambulatory Visit: Payer: Self-pay

## 2021-09-06 ENCOUNTER — Encounter (HOSPITAL_COMMUNITY)
Admission: RE | Disposition: A | Discharge status: Home / Self Care | Payer: Self-pay | Source: Home / Self Care | Attending: Interventional Cardiology

## 2021-09-06 ENCOUNTER — Ambulatory Visit (HOSPITAL_COMMUNITY)
Admission: RE | Admit: 2021-09-06 | Discharge: 2021-09-06 | Disposition: A | Discharge status: Home / Self Care | Payer: Medicare Other | Attending: Interventional Cardiology | Admitting: Interventional Cardiology

## 2021-09-06 DIAGNOSIS — E7849 Other hyperlipidemia: Secondary | ICD-10-CM | POA: Insufficient documentation

## 2021-09-06 DIAGNOSIS — I25119 Atherosclerotic heart disease of native coronary artery with unspecified angina pectoris: Secondary | ICD-10-CM | POA: Insufficient documentation

## 2021-09-06 DIAGNOSIS — I10 Essential (primary) hypertension: Secondary | ICD-10-CM | POA: Insufficient documentation

## 2021-09-06 DIAGNOSIS — Z79899 Other long term (current) drug therapy: Secondary | ICD-10-CM | POA: Insufficient documentation

## 2021-09-06 DIAGNOSIS — R002 Palpitations: Secondary | ICD-10-CM | POA: Insufficient documentation

## 2021-09-06 DIAGNOSIS — Z87891 Personal history of nicotine dependence: Secondary | ICD-10-CM | POA: Diagnosis not present

## 2021-09-06 DIAGNOSIS — K219 Gastro-esophageal reflux disease without esophagitis: Secondary | ICD-10-CM | POA: Insufficient documentation

## 2021-09-06 HISTORY — PX: LEFT HEART CATH AND CORONARY ANGIOGRAPHY: CATH118249

## 2021-09-06 SURGERY — LEFT HEART CATH AND CORONARY ANGIOGRAPHY
Anesthesia: LOCAL

## 2021-09-06 MED ORDER — SODIUM CHLORIDE 0.9 % WEIGHT BASED INFUSION
3.0000 mL/kg/h | INTRAVENOUS | Status: AC
  Administered 2021-09-06: 3 mL/kg/h via INTRAVENOUS

## 2021-09-06 MED ORDER — HEPARIN (PORCINE) IN NACL 2-0.9 UNITS/ML
INTRAMUSCULAR | Status: DC | PRN
  Administered 2021-09-06: 10 mL via INTRA_ARTERIAL

## 2021-09-06 MED ORDER — ROSUVASTATIN CALCIUM 20 MG PO TABS: 40.0000 mg | ORAL_TABLET | Freq: Every day | ORAL | 3 refills | Status: DC

## 2021-09-06 MED ORDER — OXYCODONE HCL 5 MG PO TABS: 5.0000 mg | ORAL_TABLET | ORAL | Status: DC | PRN

## 2021-09-06 MED ORDER — SODIUM CHLORIDE 0.9 % IV SOLN: INTRAVENOUS | Status: DC

## 2021-09-06 MED ORDER — SODIUM CHLORIDE 0.9 % IV SOLN: 250.0000 mL | INTRAVENOUS | Status: DC | PRN

## 2021-09-06 MED ORDER — FENTANYL CITRATE (PF) 100 MCG/2ML IJ SOLN
INTRAMUSCULAR | Status: AC
  Filled 2021-09-06: qty 2

## 2021-09-06 MED ORDER — ASPIRIN 81 MG PO CHEW: 81.0000 mg | CHEWABLE_TABLET | ORAL | Status: AC

## 2021-09-06 MED ORDER — MIDAZOLAM HCL 2 MG/2ML IJ SOLN
INTRAMUSCULAR | Status: DC | PRN
  Administered 2021-09-06: 1 mg via INTRAVENOUS
  Administered 2021-09-06: .5 mg via INTRAVENOUS

## 2021-09-06 MED ORDER — RANOLAZINE ER 500 MG PO TB12: 500.0000 mg | ORAL_TABLET | Freq: Two times a day (BID) | ORAL | 6 refills | Status: DC

## 2021-09-06 MED ORDER — LIDOCAINE HCL (PF) 1 % IJ SOLN
INTRAMUSCULAR | Status: AC
  Filled 2021-09-06: qty 30

## 2021-09-06 MED ORDER — HYDRALAZINE HCL 20 MG/ML IJ SOLN: 10.0000 mg | INTRAMUSCULAR | Status: DC | PRN

## 2021-09-06 MED ORDER — IOHEXOL 350 MG/ML SOLN
INTRAVENOUS | Status: DC | PRN
  Administered 2021-09-06: 80 mL

## 2021-09-06 MED ORDER — SODIUM CHLORIDE 0.9% FLUSH: 3.0000 mL | INTRAVENOUS | Status: DC | PRN

## 2021-09-06 MED ORDER — VERAPAMIL HCL 2.5 MG/ML IV SOLN
INTRAVENOUS | Status: AC
  Filled 2021-09-06: qty 2

## 2021-09-06 MED ORDER — HEPARIN SODIUM (PORCINE) 1000 UNIT/ML IJ SOLN
INTRAMUSCULAR | Status: AC
  Filled 2021-09-06: qty 10

## 2021-09-06 MED ORDER — NITROGLYCERIN 1 MG/10 ML FOR IR/CATH LAB
INTRA_ARTERIAL | Status: AC
  Filled 2021-09-06: qty 10

## 2021-09-06 MED ORDER — SODIUM CHLORIDE 0.9 % WEIGHT BASED INFUSION
1.0000 mL/kg/h | INTRAVENOUS | Status: DC
  Administered 2021-09-06: 300 mL via INTRAVENOUS

## 2021-09-06 MED ORDER — NITROGLYCERIN 1 MG/10 ML FOR IR/CATH LAB
INTRA_ARTERIAL | Status: DC | PRN
Start: 2021-09-06 — End: 2021-09-06
  Administered 2021-09-06: 200 ug via INTRACORONARY

## 2021-09-06 MED ORDER — SODIUM CHLORIDE 0.9% FLUSH: 3.0000 mL | Freq: Two times a day (BID) | INTRAVENOUS | Status: DC

## 2021-09-06 MED ORDER — ASPIRIN 81 MG PO CHEW: 81.0000 mg | CHEWABLE_TABLET | Freq: Every day | ORAL | Status: DC

## 2021-09-06 MED ORDER — LIDOCAINE HCL (PF) 1 % IJ SOLN
INTRAMUSCULAR | Status: DC | PRN
  Administered 2021-09-06: 2 mL

## 2021-09-06 MED ORDER — ONDANSETRON HCL 4 MG/2ML IJ SOLN
4.0000 mg | Freq: Four times a day (QID) | INTRAMUSCULAR | Status: DC | PRN
Start: 2021-09-06 — End: 2021-09-06

## 2021-09-06 MED ORDER — LABETALOL HCL 5 MG/ML IV SOLN: 10.0000 mg | INTRAVENOUS | Status: DC | PRN

## 2021-09-06 MED ORDER — HEPARIN (PORCINE) IN NACL 1000-0.9 UT/500ML-% IV SOLN
INTRAVENOUS | Status: DC | PRN
  Administered 2021-09-06 (×2): 500 mL

## 2021-09-06 MED ORDER — SODIUM CHLORIDE 0.9% FLUSH
3.0000 mL | INTRAVENOUS | Status: DC | PRN
Start: 2021-09-06 — End: 2021-09-06

## 2021-09-06 MED ORDER — HEPARIN (PORCINE) IN NACL 1000-0.9 UT/500ML-% IV SOLN
INTRAVENOUS | Status: AC
  Filled 2021-09-06: qty 1000

## 2021-09-06 MED ORDER — HEPARIN SODIUM (PORCINE) 1000 UNIT/ML IJ SOLN
INTRAMUSCULAR | Status: DC | PRN
  Administered 2021-09-06: 4500 [IU] via INTRAVENOUS

## 2021-09-06 MED ORDER — MIDAZOLAM HCL 2 MG/2ML IJ SOLN
INTRAMUSCULAR | Status: AC
  Filled 2021-09-06: qty 2

## 2021-09-06 MED ORDER — ACETAMINOPHEN 325 MG PO TABS: 650.0000 mg | ORAL_TABLET | ORAL | Status: DC | PRN

## 2021-09-06 MED ORDER — FENTANYL CITRATE (PF) 100 MCG/2ML IJ SOLN
INTRAMUSCULAR | Status: DC | PRN
  Administered 2021-09-06 (×2): 25 ug via INTRAVENOUS

## 2021-09-06 SURGICAL SUPPLY — 10 items
CATH 5FR JL3.5 JR4 ANG PIG MP (CATHETERS) ×1 IMPLANT
DEVICE RAD COMP TR BAND LRG (VASCULAR PRODUCTS) ×1 IMPLANT
GLIDESHEATH SLEND A-KIT 6F 22G (SHEATH) ×1 IMPLANT
GUIDEWIRE INQWIRE 1.5J.035X260 (WIRE) IMPLANT
INQWIRE 1.5J .035X260CM (WIRE) ×2
KIT HEART LEFT (KITS) ×2 IMPLANT
PACK CARDIAC CATHETERIZATION (CUSTOM PROCEDURE TRAY) ×2 IMPLANT
SHEATH PROBE COVER 6X72 (BAG) ×1 IMPLANT
TRANSDUCER W/STOPCOCK (MISCELLANEOUS) ×2 IMPLANT
TUBING CIL FLEX 10 FLL-RA (TUBING) ×2 IMPLANT

## 2021-09-06 NOTE — Interval H&P Note (Signed)
Cath Lab Visit (complete for each Cath Lab visit)  Clinical Evaluation Leading to the Procedure:   ACS: No.  Non-ACS:    Anginal Classification: CCS [name]  Anti-ischemic medical therapy: Minimal Therapy (1 class of medications)  Non-Invasive Test Results: High-risk stress test findings: cardiac mortality >3%/year  Prior CABG: No previous CABG      History and Physical Interval Note:  09/06/2021 7:32 AM  Edward [name]  has presented today for surgery, with the diagnosis of chest pain and CAD.  The various methods of treatment have been discussed with the patient and family. After consideration of risks, benefits and other options for treatment, the patient has consented to  Procedure(s): LEFT HEART CATH AND CORONARY ANGIOGRAPHY (N/A) as a surgical intervention.  The patient's history has been reviewed, patient examined, no change in status, stable for surgery.  I have reviewed the patient's chart and labs.  Questions were answered to the patient's satisfaction.     Edward [name]

## 2021-09-06 NOTE — CV Procedure (Signed)
Possible mild mid anterior wall hypokinesis.  EF 60%.  EDP 12 mmHg 40-50% mid RCA unchanged from prior.  Diffuse distal vessel disease without focal obstruction. 20 to 30% distal left main 30 to 40% proximal LAD.  First diagonal ostial 60 to 70% stenosis.  Second diagonal eccentric 80% stenosis.  Vessel is relatively small.  First diagonal is large and contains segmental 40 to 50% mid vessel stenosis that was modeled as being greater than 90% on CTA. 90% second obtuse marginal.  Small territory.  Second and third obtuse marginals are widely patent.  Mid circumflex 30 to 50% mid vessel narrowing  Plan to DC metoprolol since he is having side effects.  Start Ranexa.  Use sublingual nitroglycerin as needed.  Very aggressive lipid-lowering shooting for LDL cholesterol less than 55.  Close follow-up.

## 2021-09-08 ENCOUNTER — Telehealth: Payer: Self-pay | Admitting: Interventional Cardiology

## 2021-09-08 NOTE — Telephone Encounter (Signed)
Pt is wanting to know what the effects of grapefruit products will have... please advise ?

## 2021-09-08 NOTE — Telephone Encounter (Signed)
Spoke with the patient and advised that he should avoid grapefruits and grapefruit juice while on Ranexa due to it causing increased concentration of ranexa. Patient verbalized understanding.  ?

## 2021-09-27 NOTE — Progress Notes (Signed)
?Cardiology Office Note:   ? ?Date:  09/29/2021  ? ?ID:  Edward [name], DOB 07/07/1951, MRN [id number] ? ?PCP:  [name], MD  ?Cardiologist:  [name], MD  ? ?Referring MD: [name], MD  ? ?Chief Complaint  ?Patient presents with  ? Coronary Artery Disease  ? Hyperlipidemia  ? Hypertension  ? ? ?History of Present Illness:   ? ?Edward [name] is a 71 y.o. male with a hx of coronary artery disease with last cath 2010 with nonobstructive disease., gastroesophageal reflux, hyperlipidemia, and hypertension. ? ?He is doing okay.  Minimal angina.  Cath was done about 3 weeks ago.  No cath site complications. ? ?We increased statin intensity to 40 mg of rosuvastatin daily.  Target now is 55 LDL. ? ?He restated his family history of coronary disease as being strong.  I take care of 2 of his sisters, Juliann Pulse has had a stent in her 37s, Floria Raveling does have bypass surgery a week ago, his mom died of a myocardial infarction at age 49. ? ?Past Medical History:  ?Diagnosis Date  ? Anginal pain ([place])   ? last NTG 3-4 months ago  ? Anxiety   ? claustrophobic  ? Arthritis   ? throughout arms, knees & back & neck  ? CAD (coronary artery disease)   ? with high grade obstruction second obtuse marginal and diffuse LAD and circumflex disease by cath 2010  ? Cancer [facility])   ? skin cancer, on R ear - basal cell   ? Cold sore   ? right upper lip, healing well  ? Colon polyp   ? Elevated cholesterol   ? LDL Goal < 70  ? Erectile dysfunction   ? GERD (gastroesophageal reflux disease)   ? has not had problem several months  ? History of nonmelanoma skin cancer   ? History of nuclear stress test 5/10  ? no ischemia  ? Hypertension   ? ? ?Past Surgical History:  ?Procedure Laterality Date  ? ANTERIOR CERVICAL DECOMP/DISCECTOMY FUSION N/A 05/29/2013  ? Procedure: Cervical five-six Anterior cervical decompression/diskectomy/fusion with exploration of Cervical six-seven, hardware removal ;  Surgeon: Erline Levine, MD;  Location: [facility];  Service: Neurosurgery;  Laterality: N/A;  Cervical five-six Anterior cervical decompression/diskectomy/fusion with exploration of Cervical six-seven, Hardware removal ?  ? BACK SURGERY    ? lower  ? CARDIAC CATHETERIZATION    ? 2004 & 2/10, normal LV function  ? CERVICAL FUSION    ? COLONOSCOPY WITH PROPOFOL N/A 05/14/2014  ? Procedure: COLONOSCOPY WITH PROPOFOL;  Surgeon: Garlan Fair, MD;  [facility]: [facility];  Service: Endoscopy;  Laterality: N/A;  ? FINGER FRACTURE SURGERY    ? by Dr. Daylene Katayama  ? I & D KNEE WITH POLY EXCHANGE Right 05/08/2016  ? Procedure: POLY EXCHANGE;  Surgeon: Paralee Cancel, MD;  [facility]: [facility];  Service: Orthopedics;  Laterality: Right;  ? KNEE ARTHROSCOPY  06/13/2011  ? Procedure: ARTHROSCOPY KNEE;  Surgeon: Tobi Bastos;  [facility];  Service: Orthopedics;  Laterality: Right;  Right Knee Arthroscopy with Medial Menisectomy  ? LEFT HEART CATH AND CORONARY ANGIOGRAPHY N/A 10/09/2017  ? Procedure: LEFT HEART CATH AND CORONARY ANGIOGRAPHY;  Surgeon: Belva Crome, MD;  Location: [facility] CV [facility];  Service: Cardiovascular;  Laterality: N/A;  ? LEFT HEART CATH AND CORONARY ANGIOGRAPHY N/A 09/06/2021  ? Procedure: LEFT HEART CATH AND CORONARY ANGIOGRAPHY;  Surgeon: Belva Crome, MD;  Location: [facility]  INVASIVE CV LAB;  Service: Cardiovascular;  Laterality: N/A;  ? MAXIMUM ACCESS (MAS)POSTERIOR LUMBAR INTERBODY FUSION (PLIF) 2 LEVEL N/A 01/04/2017  ? Procedure: Lumbar four- five Lumbar five-Sacral one Maximum access posterior lumbar interbody fusion;  Surgeon: Erline Levine, MD;  Location: [facility];  Service: Neurosurgery;  Laterality: N/A;  Lumbar four- five Lumbar five-Sacral one Maximum access posterior lumbar interbody fusion  ? NECK SURGERY  05/29/2013  ? SCAR DEBRIDEMENT OF TOTAL KNEE Right 05/08/2016  ? Procedure: SCAR DEBRIDEMENT OF TOTAL KNEE, EXCISION OF SAPHENOUS NEUROMA;  Surgeon: Paralee Cancel, MD;  [facility];  Service: Orthopedics;  Laterality: Right;  ?  SHOULDER SURGERY    ? 2 left / x3 right  ? SKIN CANCER EXCISION    ? SKIN SURGERY    ? nose and right ear skin cancer surgery  ? TOTAL KNEE ARTHROPLASTY Right 03/30/2015  ? Procedure: RIGHT TOTAL KNEE ARTHROPLASTY;  Surgeon: Latanya Maudlin, MD;  [facility];  Service: Orthopedics;  Laterality: Right;  ? ? ?Current Medications: ?Current Meds  ?Medication Sig  ? ALPRAZolam (XANAX) 0.5 MG tablet Take 0.25 mg by mouth at bedtime.  ? aspirin EC 81 MG tablet Take 1 tablet (81 mg total) by mouth daily. (Patient taking differently: Take 81 mg by mouth at bedtime.)  ? Cholecalciferol (VITAMIN D3 PO) Take 1 tablet by mouth daily.  ? HYDROcodone-acetaminophen (NORCO) 10-325 MG tablet Take 1 tablet by mouth 2 (two) times daily.  ? LYRICA 75 MG capsule Take 75 mg by mouth at bedtime.  ? Multiple Vitamin (MULTIVITAMIN) tablet Take 1 tablet by mouth daily.  ? nitroGLYCERIN (NITROSTAT) 0.4 MG SL tablet DISSOLVE 1 TABLET UNDER THE TONGUE EVERY 5 MINUTES AS  NEEDED FOR CHEST PAIN . MAX 3 TABS IN 15 MINUTES &amp; CALL 911 IF CHEST PAIN PERSIST  ? pantoprazole (PROTONIX) 40 MG tablet Take 40 mg by mouth daily as needed (for acid reflux).   ? ranolazine (RANEXA) 500 MG 12 hr tablet Take 1 tablet (500 mg total) by mouth 2 (two) times daily.  ? rosuvastatin (CRESTOR) 40 MG tablet Take 1 tablet (40 mg total) by mouth daily.  ? tamsulosin (FLOMAX) 0.4 MG CAPS capsule Take 0.4 mg by mouth at bedtime.  ? [DISCONTINUED] rosuvastatin (CRESTOR) 20 MG tablet Take 2 tablets (40 mg total) by mouth daily.  ?  ? ?Allergies:   Nsaids and Percocet [oxycodone-acetaminophen]  ? ?Social History  ? ?Socioeconomic History  ? Marital status: Divorced  ?  Spouse name: Not on file  ? Number of children: Not on file  ? Years of education: Not on file  ? Highest education level: Not on file  ?Occupational History  ? Not on file  ?Tobacco Use  ? Smoking status: Former  ?  Types: Cigarettes  ?  Quit date: 07/10/1998  ?  Years since quitting: 23.2  ? Smokeless  tobacco: Never  ?Vaping Use  ? Vaping Use: Never used  ?Substance and Sexual Activity  ? Alcohol use: No  ? Drug use: No  ? Sexual activity: Not on file  ?Other Topics Concern  ? Not on file  ?Social History Narrative  ? Not on file  ? ?Social Determinants of Health  ? ?Financial Resource Strain: Not on file  ?Food Insecurity: Not on file  ?Transportation Needs: Not on file  ?Physical Activity: Not on file  ?Stress: Not on file  ?Social Connections: Not on file  ?  ? ?Family History: ?The patient's family history includes CAD  in his sister and sister; CVA in his sister; Heart attack in his mother and sister. ? ?ROS:   ?Please see the history of present illness.    ?Right knee is given trouble.  He wonders if he could be cleared to have repeat surgery on his right knee.  He is reassured that his coronary disease would not prevent that.  All other systems reviewed and are negative. ? ?EKGs/Labs/Other Studies Reviewed:   ? ?The following studies were reviewed today: ?CORONARY ANGIOGRAPHY 2023: ?Diagnostic ?Dominance: Right ? ? ?EKG:  EKG not repeated ? ?Recent Labs: ?08/30/2021: BUN 11; Creatinine, Ser 0.75; Hemoglobin 14.2; Platelets 183; Potassium 4.6; Sodium 140  ?Recent Lipid Panel ?   ?Component Value Date/Time  ? CHOL 137 07/09/2015 0856  ? TRIG 95 07/09/2015 0856  ? HDL 47 07/09/2015 0856  ? CHOLHDL 2.9 07/09/2015 0856  ? VLDL 19 07/09/2015 0856  ? [place] 71 07/09/2015 0856  ? ? ?Physical Exam:   ? ?VS:  BP (!) 118/56   Pulse (!) 53   Ht '5\' 9"'$  (1.753 m)   Wt 194 lb 12.8 oz (88.4 kg)   SpO2 97%   BMI 28.77 kg/m?    ? ?Wt Readings from Last 3 Encounters:  ?09/29/21 194 lb 12.8 oz (88.4 kg)  ?09/06/21 193 lb (87.5 kg)  ?08/30/21 193 lb 6.4 oz (87.7 kg)  ?  ? ?GEN: Healthy appearing. No acute distress ?HEENT: Normal ?NECK: No JVD. ?LYMPHATICS: No lymphadenopathy ?CARDIAC: No murmur. RRR no gallop, or edema. ?VASCULAR:  Normal Pulses. No bruits. ?RESPIRATORY:  Clear to auscultation without rales, wheezing or  rhonchi  ?ABDOMEN: Soft, non-tender, non-distended, No pulsatile mass, ?MUSCULOSKELETAL: No deformity  ?SKIN: Warm and dry ?NEUROLOGIC:  Alert and oriented x 3 ?PSYCHIATRIC:  Normal affect  ? ?ASSESSMENT:   ? ?1.

## 2021-09-29 ENCOUNTER — Other Ambulatory Visit: Payer: Self-pay

## 2021-09-29 ENCOUNTER — Ambulatory Visit (INDEPENDENT_AMBULATORY_CARE_PROVIDER_SITE_OTHER): Payer: Medicare Other | Admitting: Interventional Cardiology

## 2021-09-29 ENCOUNTER — Encounter: Payer: Self-pay | Admitting: Interventional Cardiology

## 2021-09-29 VITALS — BP 118/56 | HR 53 | Ht 69.0 in | Wt 194.8 lb

## 2021-09-29 DIAGNOSIS — I10 Essential (primary) hypertension: Secondary | ICD-10-CM | POA: Diagnosis not present

## 2021-09-29 DIAGNOSIS — E7849 Other hyperlipidemia: Secondary | ICD-10-CM | POA: Diagnosis not present

## 2021-09-29 DIAGNOSIS — M1711 Unilateral primary osteoarthritis, right knee: Secondary | ICD-10-CM | POA: Diagnosis not present

## 2021-09-29 DIAGNOSIS — I25119 Atherosclerotic heart disease of native coronary artery with unspecified angina pectoris: Secondary | ICD-10-CM

## 2021-09-29 MED ORDER — ROSUVASTATIN CALCIUM 20 MG PO TABS: 40.0000 mg | ORAL_TABLET | Freq: Every day | ORAL | 3 refills | Status: DC

## 2021-09-29 MED ORDER — ROSUVASTATIN CALCIUM 40 MG PO TABS: 40.0000 mg | ORAL_TABLET | Freq: Every day | ORAL | 3 refills | Status: DC

## 2021-09-29 NOTE — Patient Instructions (Signed)
Medication Instructions:  ?Your physician recommends that you continue on your current medications as directed. Please refer to the Current Medication list given to you today. ? ?*If you need a refill on your cardiac medications before your next appointment, please call your pharmacy* ? ? ?Lab Work: ?Liver and Lipid in mid April. You will need to be fasting for these labs (nothing to eat or drink after midnight except water and black coffee). ? ? ?If you have labs (blood work) drawn today and your tests are completely normal, you will receive your results only by: ?MyChart Message (if you have MyChart) OR ?A paper copy in the mail ?If you have any lab test that is abnormal or we need to change your treatment, we will call you to review the results. ? ? ?Testing/Procedures: ?None ? ? ?Follow-Up: ?At [facility], you and your health needs are our priority.  As part of our continuing mission to provide you with exceptional heart care, we have created designated Provider Care Teams.  These Care Teams include your primary Cardiologist (physician) and Advanced Practice Providers (APPs -  Physician Assistants and Nurse Practitioners) who all work together to provide you with the care you need, when you need it. ? ?We recommend signing up for the patient portal called "MyChart".  Sign up information is provided on this After Visit Summary.  MyChart is used to connect with patients for Virtual Visits (Telemedicine).  Patients are able to view lab/test results, encounter notes, upcoming appointments, etc.  Non-urgent messages can be sent to your provider as well.   ?To learn more about what you can do with MyChart, go to [email or web address].   ? ?Your next appointment:   ?6-8 month(s) ? ?The format for your next appointment:   ?In Person ? ?Provider:   ?[name], MD  ? ? ?Other Instructions ?  ?

## 2021-10-17 ENCOUNTER — Other Ambulatory Visit: Payer: Self-pay | Admitting: *Deleted

## 2021-10-17 MED ORDER — NITROGLYCERIN 0.4 MG SL SUBL: SUBLINGUAL_TABLET | SUBLINGUAL | 3 refills | Status: DC

## 2021-10-20 ENCOUNTER — Other Ambulatory Visit: Payer: Medicare Other

## 2021-10-20 DIAGNOSIS — E7849 Other hyperlipidemia: Secondary | ICD-10-CM

## 2021-10-20 LAB — LIPID PANEL
Chol/HDL Ratio: 2.7 ratio (ref 0.0–5.0)
Cholesterol, Total: 130 mg/dL (ref 100–199)
HDL: 48 mg/dL (ref >39)
LDL Chol Calc (NIH): 63 mg/dL (ref 0–99)
Triglycerides: 104 mg/dL (ref 0–149)
VLDL Cholesterol Cal: 19 mg/dL (ref 5–40)

## 2021-10-20 LAB — HEPATIC FUNCTION PANEL
ALT: 11 IU/L (ref 0–44)
AST: 18 IU/L (ref 0–40)
Albumin: 4.3 g/dL (ref 3.8–4.8)
Alkaline Phosphatase: 56 IU/L (ref 44–121)
Bilirubin Total: 0.6 mg/dL (ref 0.0–1.2)
Bilirubin, Direct: 0.19 mg/dL (ref 0.00–0.40)
Total Protein: 6.2 g/dL (ref 6.0–8.5)

## 2021-10-24 DIAGNOSIS — M5459 Other low back pain: Secondary | ICD-10-CM | POA: Diagnosis not present

## 2021-10-24 DIAGNOSIS — M5416 Radiculopathy, lumbar region: Secondary | ICD-10-CM | POA: Diagnosis not present

## 2021-10-24 DIAGNOSIS — M5136 Other intervertebral disc degeneration, lumbar region: Secondary | ICD-10-CM | POA: Diagnosis not present

## 2021-10-24 DIAGNOSIS — G894 Chronic pain syndrome: Secondary | ICD-10-CM | POA: Diagnosis not present

## 2021-11-02 DIAGNOSIS — M25561 Pain in right knee: Secondary | ICD-10-CM | POA: Diagnosis not present

## 2021-11-02 DIAGNOSIS — Z96651 Presence of right artificial knee joint: Secondary | ICD-10-CM | POA: Diagnosis not present

## 2021-11-08 ENCOUNTER — Other Ambulatory Visit (HOSPITAL_COMMUNITY): Payer: Self-pay | Admitting: Orthopedic Surgery

## 2021-11-08 ENCOUNTER — Other Ambulatory Visit: Payer: Self-pay | Admitting: Orthopedic Surgery

## 2021-11-08 DIAGNOSIS — Z96651 Presence of right artificial knee joint: Secondary | ICD-10-CM

## 2021-11-21 ENCOUNTER — Encounter (HOSPITAL_COMMUNITY)
Admission: RE | Admit: 2021-11-21 | Discharge: 2021-11-21 | Disposition: A | Discharge status: Home / Self Care | Payer: Medicare Other | Source: Ambulatory Visit | Attending: Orthopedic Surgery | Admitting: Orthopedic Surgery

## 2021-11-21 DIAGNOSIS — Z96651 Presence of right artificial knee joint: Secondary | ICD-10-CM

## 2021-11-21 DIAGNOSIS — M25561 Pain in right knee: Secondary | ICD-10-CM | POA: Diagnosis not present

## 2021-11-21 DIAGNOSIS — M1712 Unilateral primary osteoarthritis, left knee: Secondary | ICD-10-CM | POA: Diagnosis not present

## 2021-11-21 MED ORDER — TECHNETIUM TC 99M MEDRONATE IV KIT
20.0000 | PACK | Freq: Once | INTRAVENOUS | Status: AC | PRN
  Administered 2021-11-21: 21.7 via INTRAVENOUS

## 2021-11-23 DIAGNOSIS — I209 Angina pectoris, unspecified: Secondary | ICD-10-CM | POA: Diagnosis not present

## 2021-11-23 DIAGNOSIS — I700 Atherosclerosis of aorta: Secondary | ICD-10-CM | POA: Diagnosis not present

## 2021-11-23 DIAGNOSIS — I10 Essential (primary) hypertension: Secondary | ICD-10-CM | POA: Diagnosis not present

## 2021-11-25 ENCOUNTER — Other Ambulatory Visit: Payer: Self-pay

## 2021-11-25 MED ORDER — RANOLAZINE ER 500 MG PO TB12: 500.0000 mg | ORAL_TABLET | Freq: Two times a day (BID) | ORAL | 2 refills | Status: DC

## 2021-12-02 DIAGNOSIS — M25561 Pain in right knee: Secondary | ICD-10-CM | POA: Diagnosis not present

## 2021-12-02 DIAGNOSIS — Z96651 Presence of right artificial knee joint: Secondary | ICD-10-CM | POA: Diagnosis not present

## 2021-12-02 DIAGNOSIS — G5781 Other specified mononeuropathies of right lower limb: Secondary | ICD-10-CM | POA: Diagnosis not present

## 2021-12-15 ENCOUNTER — Other Ambulatory Visit: Payer: Self-pay | Admitting: Interventional Cardiology

## 2021-12-18 ENCOUNTER — Other Ambulatory Visit: Payer: Self-pay | Admitting: Interventional Cardiology

## 2021-12-19 ENCOUNTER — Ambulatory Visit: Payer: Medicare Other | Admitting: Interventional Cardiology

## 2022-01-06 DIAGNOSIS — Z96651 Presence of right artificial knee joint: Secondary | ICD-10-CM | POA: Diagnosis not present

## 2022-01-06 DIAGNOSIS — M79632 Pain in left forearm: Secondary | ICD-10-CM | POA: Diagnosis not present

## 2022-01-06 DIAGNOSIS — S5012XA Contusion of left forearm, initial encounter: Secondary | ICD-10-CM | POA: Insufficient documentation

## 2022-01-06 DIAGNOSIS — M25561 Pain in right knee: Secondary | ICD-10-CM | POA: Diagnosis not present

## 2022-02-20 DIAGNOSIS — M5136 Other intervertebral disc degeneration, lumbar region: Secondary | ICD-10-CM | POA: Diagnosis not present

## 2022-02-20 DIAGNOSIS — G894 Chronic pain syndrome: Secondary | ICD-10-CM | POA: Diagnosis not present

## 2022-02-20 DIAGNOSIS — M542 Cervicalgia: Secondary | ICD-10-CM | POA: Diagnosis not present

## 2022-02-20 DIAGNOSIS — M5459 Other low back pain: Secondary | ICD-10-CM | POA: Diagnosis not present

## 2022-02-20 DIAGNOSIS — M5416 Radiculopathy, lumbar region: Secondary | ICD-10-CM | POA: Diagnosis not present

## 2022-03-21 DIAGNOSIS — M79631 Pain in right forearm: Secondary | ICD-10-CM | POA: Diagnosis not present

## 2022-03-21 DIAGNOSIS — M25521 Pain in right elbow: Secondary | ICD-10-CM | POA: Diagnosis not present

## 2022-05-04 NOTE — Progress Notes (Signed)
Office Visit    Patient Name: Edward [name] Date of Encounter: 05/05/2022  PCP:  Kathalene Frames, MD   [place]  Cardiologist:  [name], MD  Advanced Practice Provider:  No care team member to display Electrophysiologist:  None   HPI    Edward [name] is a 71 y.o. male with a past medical history significant for coronary artery disease (last cath 2010 with nonobstructive disease), GERD, hyperlipidemia, and hypertension presents today for 7-monthfollow-up visit.  He was last seen March 2023 and was doing okay at that time.  Cardiac catheterization was 3 weeks prior and there were no cath site complications.  Increased statin to 40 mg and now LDL was at target, 55.  The patient restated that his family history of coronary disease was strong.  Dr. STamala Juliantakes care of his 2 sisters as well who have coronary artery disease.  His mother passed away of myocardial infarction at age 69  Today, he tells me that he was recently started on Ranexa 500 mg twice daily back in February when he saw Dr. STamala Julianand he is still requiring nitroglycerin a few times a month usually 1-2 tabs but he does not enjoy how this medication makes him feel.  He usually sits down and rests for a few minutes to see if the chest pain would go away.  He also suffers from vertigo and he had an episode where he went to the bathroom and fell on his way there.  I have encouraged him to discuss this with his primary for further work-up and potentially get involved with physical therapy for his vertigo symptoms.  Otherwise, doing well from a cardiac standpoint.  Reports no shortness of breath nor dyspnea on exertion.No edema, orthopnea, PND. Reports no palpitations.    Past Medical History    Past Medical History:  Diagnosis Date   Anginal pain ([place]    last NTG 3-4 months ago   Anxiety    claustrophobic   Arthritis    throughout arms, knees & back & neck   CAD (coronary artery  disease)    with high grade obstruction second obtuse marginal and diffuse LAD and circumflex disease by cath 2010   Cancer ([facility]    skin cancer, on R ear - basal cell    Cold sore    right upper lip, healing well   Colon polyp    Elevated cholesterol    LDL Goal < 70   Erectile dysfunction    GERD (gastroesophageal reflux disease)    has not had problem several months   History of nonmelanoma skin cancer    History of nuclear stress test 5/10   no ischemia   Hypertension    Past Surgical History:  Procedure Laterality Date   ANTERIOR CERVICAL DECOMP/DISCECTOMY FUSION N/A 05/29/2013   Procedure: Cervical five-six Anterior cervical decompression/diskectomy/fusion with exploration of Cervical six-seven, hardware removal ;  Surgeon: JErline Levine MD;  Location: [facility];  Service: Neurosurgery;  Laterality: N/A;  Cervical five-six Anterior cervical decompression/diskectomy/fusion with exploration of Cervical six-seven, Hardware removal    BACK SURGERY     lower   CARDIAC CATHETERIZATION     2004 & 2/10, normal LV function   CERVICAL FUSION     COLONOSCOPY WITH PROPOFOL N/A 05/14/2014   Procedure: COLONOSCOPY WITH PROPOFOL;  Surgeon: MGarlan Fair MD;  [facility];  Service: Endoscopy;  Laterality: N/A;   FINGER FRACTURE  SURGERY     by Dr. Daylene Katayama   I & D KNEE WITH POLY EXCHANGE Right 05/08/2016   Procedure: POLY EXCHANGE;  Surgeon: Paralee Cancel, MD;  [facility];  Service: Orthopedics;  Laterality: Right;   KNEE ARTHROSCOPY  06/13/2011   Procedure: ARTHROSCOPY KNEE;  Surgeon: Tobi Bastos;  [facility];  Service: Orthopedics;  Laterality: Right;  Right Knee Arthroscopy with Medial Menisectomy   LEFT HEART CATH AND CORONARY ANGIOGRAPHY N/A 10/09/2017   Procedure: LEFT HEART CATH AND CORONARY ANGIOGRAPHY;  Surgeon: Belva Crome, MD;  Location: [place] CV LAB;  Service: Cardiovascular;  Laterality: N/A;   LEFT HEART CATH AND CORONARY ANGIOGRAPHY N/A  09/06/2021   Procedure: LEFT HEART CATH AND CORONARY ANGIOGRAPHY;  Surgeon: Belva Crome, MD;  Location: [facility];  Service: Cardiovascular;  Laterality: N/A;   MAXIMUM ACCESS (MAS)POSTERIOR LUMBAR INTERBODY FUSION (PLIF) 2 LEVEL N/A 01/04/2017   Procedure: Lumbar four- five Lumbar five-Sacral one Maximum access posterior lumbar interbody fusion;  Surgeon: Erline Levine, MD;  Location: [facility];  Service: Neurosurgery;  Laterality: N/A;  Lumbar four- five Lumbar five-Sacral one Maximum access posterior lumbar interbody fusion   NECK SURGERY  05/29/2013   SCAR DEBRIDEMENT OF TOTAL KNEE Right 05/08/2016   Procedure: SCAR DEBRIDEMENT OF TOTAL KNEE, EXCISION OF SAPHENOUS NEUROMA;  Surgeon: Paralee Cancel, MD;  [facility];  Service: Orthopedics;  Laterality: Right;   SHOULDER SURGERY     2 left / x3 right   SKIN CANCER EXCISION     SKIN SURGERY     nose and right ear skin cancer surgery   TOTAL KNEE ARTHROPLASTY Right 03/30/2015   Procedure: RIGHT TOTAL KNEE ARTHROPLASTY;  Surgeon: Latanya Maudlin, MD;  [facility];  Service: Orthopedics;  Laterality: Right;    Allergies  Allergies  Allergen Reactions   Nsaids Other (See Comments)    UNSPECIFIED REACTION. Heart troubles - MD suggested stopping   Percocet [Oxycodone-Acetaminophen] Other (See Comments)    "feel funny"     EKGs/Labs/Other Studies Reviewed:   The following studies were reviewed today: CORONARY ANGIOGRAPHY 2023: Diagnostic Dominance: Right     EKG:  EKG is not ordered today.   Recent Labs: 08/30/2021: BUN 11; Creatinine, Ser 0.75; Hemoglobin 14.2; Platelets 183; Potassium 4.6; Sodium 140 10/20/2021: ALT 11  Recent Lipid Panel    Component Value Date/Time   CHOL 130 10/20/2021 0805   TRIG 104 10/20/2021 0805   HDL 48 10/20/2021 0805   CHOLHDL 2.7 10/20/2021 0805   CHOLHDL 2.9 07/09/2015 0856   VLDL 19 07/09/2015 0856   LDLCALC 63 10/20/2021 0805    Home Medications   Current Meds  Medication  Sig   ALPRAZolam (XANAX) 0.5 MG tablet Take 0.25 mg by mouth at bedtime.   aspirin EC 81 MG tablet Take 1 tablet (81 mg total) by mouth daily.   Cholecalciferol (VITAMIN D3 PO) Take 1 tablet by mouth daily.   HYDROcodone-acetaminophen (NORCO) 10-325 MG tablet Take 1 tablet by mouth 2 (two) times daily.   LYRICA 75 MG capsule Take 75 mg by mouth at bedtime.   Multiple Vitamin (MULTIVITAMIN) tablet Take 1 tablet by mouth daily.   nitroGLYCERIN (NITROSTAT) 0.4 MG SL tablet DISSOLVE 1 TABLET UNDER THE  TONGUE EVERY 5 MINUTES AS NEEDED FOR CHEST PAIN. MAX OF 3 TABLETS IN 15 MINUTES. CALL 911 IF PAIN  PERSISTS.   pantoprazole (PROTONIX) 40 MG tablet Take 40 mg by mouth daily as needed (for  acid reflux).    ranolazine (RANEXA) 500 MG 12 hr tablet Take 1 tablet (500 mg total) by mouth 2 (two) times daily.   rosuvastatin (CRESTOR) 40 MG tablet Take 1 tablet (40 mg total) by mouth daily.   tamsulosin (FLOMAX) 0.4 MG CAPS capsule Take 0.4 mg by mouth at bedtime.     Review of Systems      All other systems reviewed and are otherwise negative except as noted above.  Physical Exam    VS:  BP 104/64   Pulse 66   Ht '5\' 9"'$  (1.753 m)   Wt 184 lb 6.4 oz (83.6 kg)   SpO2 94%   BMI 27.23 kg/m  , BMI Body mass index is 27.23 kg/m.  Wt Readings from Last 3 Encounters:  05/05/22 184 lb 6.4 oz (83.6 kg)  09/29/21 194 lb 12.8 oz (88.4 kg)  09/06/21 193 lb (87.5 kg)     GEN: Well nourished, well developed, in no acute distress. HEENT: normal. Neck: Supple, no JVD, carotid bruits, or masses. Cardiac: RRR, no murmurs, rubs, or gallops. No clubbing, cyanosis, edema.  Radials/PT 2+ and equal bilaterally.  Respiratory:  Respirations regular and unlabored, clear to auscultation bilaterally. GI: Soft, nontender, nondistended. MS: No deformity or atrophy. Skin: Warm and dry, no rash. Neuro:  Strength and sensation are intact. Psych: Normal affect.  Assessment & Plan    Coronary artery disease Recent  cardiac catheterization 2/23 showed diffuse coronary artery disease in the smaller vessels, recommended medical therapy -Continue ASA 81 mg, nitro as needed, Ranexa 500 mg twice a day, and Crestor 40 mg daily  Hyperlipidemia -lipid panel 10/2022 -Most recent lipid panel showed LDL 63, HDL 48, total cholesterol 130, triglycerides 104, at goal  Hypertension -Not currently on any blood pressure medicines and BP borderline low -Its been difficult to add Imdur to his medication regimen due to his borderline low blood pressure he also suffers from vertigo and has issues with his right knee which causes it to buckle and feel like he is going to fall in this instance would not add more medicines that would potentially decrease blood pressure         Disposition: Follow up 6 months with [name], MD or APP.  Signed, Elgie Collard, PA-C 05/05/2022, 9:29 AM [facility]

## 2022-05-05 ENCOUNTER — Encounter: Payer: Self-pay | Admitting: Physician Assistant

## 2022-05-05 ENCOUNTER — Ambulatory Visit: Payer: Medicare Other | Attending: Physician Assistant | Admitting: Physician Assistant

## 2022-05-05 VITALS — BP 104/64 | HR 66 | Ht 69.0 in | Wt 184.4 lb

## 2022-05-05 DIAGNOSIS — I2510 Atherosclerotic heart disease of native coronary artery without angina pectoris: Secondary | ICD-10-CM | POA: Diagnosis not present

## 2022-05-05 DIAGNOSIS — I10 Essential (primary) hypertension: Secondary | ICD-10-CM

## 2022-05-05 DIAGNOSIS — E785 Hyperlipidemia, unspecified: Secondary | ICD-10-CM

## 2022-05-05 MED ORDER — RANOLAZINE ER 500 MG PO TB12: 500.0000 mg | ORAL_TABLET | Freq: Two times a day (BID) | ORAL | 3 refills | Status: DC

## 2022-05-05 MED ORDER — ROSUVASTATIN CALCIUM 40 MG PO TABS: 40.0000 mg | ORAL_TABLET | Freq: Every day | ORAL | 3 refills | Status: DC

## 2022-05-05 NOTE — Patient Instructions (Signed)
Medication Instructions:   Your physician recommends that you continue on your current medications as directed. Please refer to the Current Medication list given to you today.   *If you need a refill on your cardiac medications before your next appointment, please call your pharmacy*   Lab Work: RETURN FOR LABS IN 10-2022  FASTING LIPIDS    If you have labs (blood work) drawn today and your tests are completely normal, you will receive your results only by: [facility] (if you have MyChart) OR A paper copy in the mail If you have any lab test that is abnormal or we need to change your treatment, we will call you to review the results.   Testing/Procedures: NONE ORDERED  TODAY    Follow-Up: At [facility], you and your health needs are our priority.  As part of our continuing mission to provide you with exceptional heart care, we have created designated Provider Care Teams.  These Care Teams include your primary Cardiologist (physician) and Advanced Practice Providers (APPs -  Physician Assistants and Nurse Practitioners) who all work together to provide you with the care you need, when you need it.  We recommend signing up for the patient portal called "MyChart".  Sign up information is provided on this After Visit Summary.  MyChart is used to connect with patients for Virtual Visits (Telemedicine).  Patients are able to view lab/test results, encounter notes, upcoming appointments, etc.  Non-urgent messages can be sent to your provider as well.   To learn more about what you can do with MyChart, go to [email or web address].    Your next appointment:    6 month(s)  The format for your next appointment:    In Person  Provider:    [name], MD     Other Instructions    Important Information About Sugar

## 2022-05-15 ENCOUNTER — Other Ambulatory Visit: Payer: Self-pay | Admitting: *Deleted

## 2022-05-15 MED ORDER — METOPROLOL SUCCINATE ER 25 MG PO TB24: 12.5000 mg | ORAL_TABLET | Freq: Every day | ORAL | 3 refills | Status: DC

## 2022-05-15 NOTE — Progress Notes (Signed)
Spoke with patient and provided him with recommendation to start metop xl 12.5 mg daily. He agrees with this plan and is aware that he will need to cut the 25 mg tablet in half to equal prescribed dose of 12.5 mg daily. Sent to local pharmacy per his request and he will let us know how he tolerates it and request that we send it to his mail order pharmacy at that time if needed.

## 2022-05-22 DIAGNOSIS — M25521 Pain in right elbow: Secondary | ICD-10-CM | POA: Insufficient documentation

## 2022-05-24 DIAGNOSIS — M7711 Lateral epicondylitis, right elbow: Secondary | ICD-10-CM | POA: Insufficient documentation

## 2022-05-24 DIAGNOSIS — M25521 Pain in right elbow: Secondary | ICD-10-CM | POA: Diagnosis not present

## 2022-05-24 DIAGNOSIS — M79631 Pain in right forearm: Secondary | ICD-10-CM | POA: Diagnosis not present

## 2022-06-08 DIAGNOSIS — L821 Other seborrheic keratosis: Secondary | ICD-10-CM | POA: Diagnosis not present

## 2022-06-08 DIAGNOSIS — L218 Other seborrheic dermatitis: Secondary | ICD-10-CM | POA: Diagnosis not present

## 2022-06-08 DIAGNOSIS — B351 Tinea unguium: Secondary | ICD-10-CM | POA: Diagnosis not present

## 2022-06-08 DIAGNOSIS — D1801 Hemangioma of skin and subcutaneous tissue: Secondary | ICD-10-CM | POA: Diagnosis not present

## 2022-06-08 DIAGNOSIS — H61001 Unspecified perichondritis of right external ear: Secondary | ICD-10-CM | POA: Diagnosis not present

## 2022-06-08 DIAGNOSIS — Z85828 Personal history of other malignant neoplasm of skin: Secondary | ICD-10-CM | POA: Diagnosis not present

## 2022-06-08 DIAGNOSIS — L814 Other melanin hyperpigmentation: Secondary | ICD-10-CM | POA: Diagnosis not present

## 2022-06-08 DIAGNOSIS — D225 Melanocytic nevi of trunk: Secondary | ICD-10-CM | POA: Diagnosis not present

## 2022-06-08 DIAGNOSIS — D2261 Melanocytic nevi of right upper limb, including shoulder: Secondary | ICD-10-CM | POA: Diagnosis not present

## 2022-06-08 DIAGNOSIS — L570 Actinic keratosis: Secondary | ICD-10-CM | POA: Diagnosis not present

## 2022-06-23 DIAGNOSIS — K219 Gastro-esophageal reflux disease without esophagitis: Secondary | ICD-10-CM | POA: Diagnosis not present

## 2022-06-23 DIAGNOSIS — I10 Essential (primary) hypertension: Secondary | ICD-10-CM | POA: Diagnosis not present

## 2022-06-23 DIAGNOSIS — R42 Dizziness and giddiness: Secondary | ICD-10-CM | POA: Diagnosis not present

## 2022-06-23 DIAGNOSIS — I209 Angina pectoris, unspecified: Secondary | ICD-10-CM | POA: Diagnosis not present

## 2022-06-23 DIAGNOSIS — Z0000 Encounter for general adult medical examination without abnormal findings: Secondary | ICD-10-CM | POA: Diagnosis not present

## 2022-06-23 DIAGNOSIS — E7800 Pure hypercholesterolemia, unspecified: Secondary | ICD-10-CM | POA: Diagnosis not present

## 2022-06-23 DIAGNOSIS — Z79899 Other long term (current) drug therapy: Secondary | ICD-10-CM | POA: Diagnosis not present

## 2022-06-23 DIAGNOSIS — R739 Hyperglycemia, unspecified: Secondary | ICD-10-CM | POA: Diagnosis not present

## 2022-06-26 DIAGNOSIS — G894 Chronic pain syndrome: Secondary | ICD-10-CM | POA: Diagnosis not present

## 2022-06-26 DIAGNOSIS — M542 Cervicalgia: Secondary | ICD-10-CM | POA: Diagnosis not present

## 2022-06-26 DIAGNOSIS — M5416 Radiculopathy, lumbar region: Secondary | ICD-10-CM | POA: Diagnosis not present

## 2022-06-26 DIAGNOSIS — M5459 Other low back pain: Secondary | ICD-10-CM | POA: Diagnosis not present

## 2022-07-06 DIAGNOSIS — M5416 Radiculopathy, lumbar region: Secondary | ICD-10-CM | POA: Diagnosis not present

## 2022-07-19 ENCOUNTER — Other Ambulatory Visit: Payer: Self-pay | Admitting: Physician Assistant

## 2022-08-25 IMAGING — NM NM BONE 3 PHASE
10 series · 20 of 20 positions shown · non-contrast
Comparison: Bone scan 11/08/2017

CLINICAL DATA: Right knee replacement 6682 with revision 2760,
medial right-sided knee pain, left knee pain

EXAM:
NUCLEAR MEDICINE 3-PHASE BONE SCAN
TECHNIQUE: Radionuclide angiographic images, immediate static blood pool
images, and 3-hour delayed static images were obtained of the
bilateral knees after intravenous injection of radiopharmaceutical.
RADIOPHARMACEUTICALS:  21.7 mCi Tc-YYm MDP IV

[Series 1: flow · 2.07mm/px · 6 of 48 frames shown (1 of 2)]
[frame 5/48]
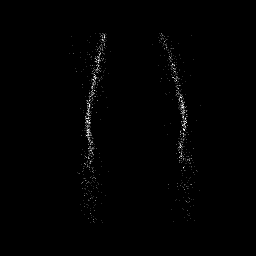
[frame 13/48  full-range]
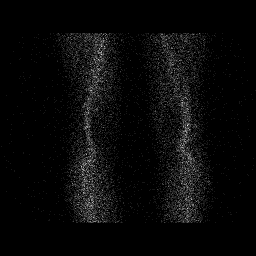
[frame 21/48  full-range]
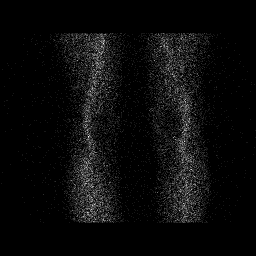
[frame 29/48  full-range]
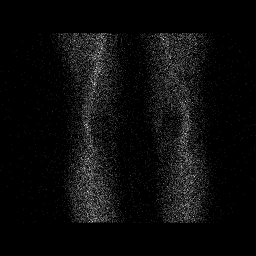
[frame 37/48  full-range]
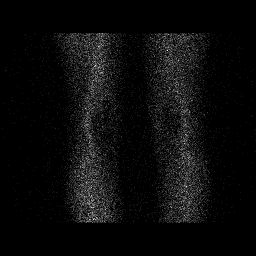
[frame 45/48  full-range]
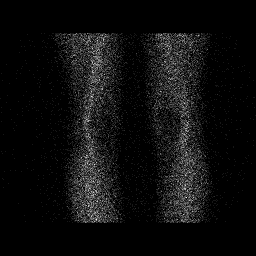

[Series 1: flow · 2.07mm/px · 6 of 48 frames shown (2 of 2)]
[frame 5/48]
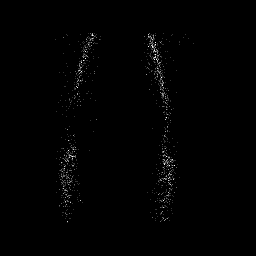
[frame 13/48  full-range]
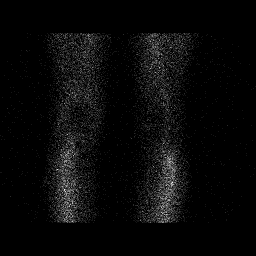
[frame 21/48  full-range]
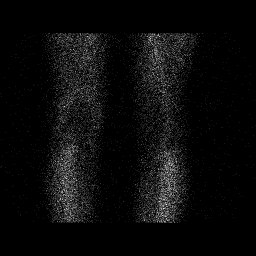
[frame 29/48  full-range]
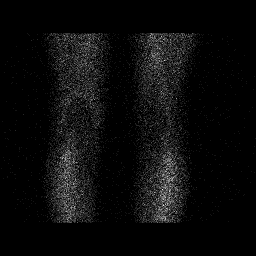
[frame 37/48  full-range]
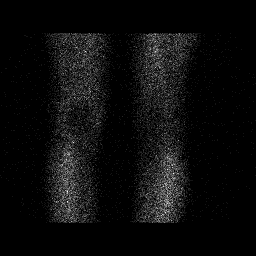
[frame 45/48  full-range]
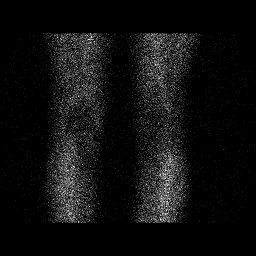

[Series 2: blood pool · 2.07mm/px · 1 of 1 slices shown (1 of 2)]
[im 1/1  full-range]
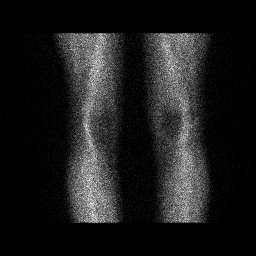

[Series 2: blood pool · 2.07mm/px · 1 of 1 slices shown (2 of 2)]
[im 1/1  full-range]
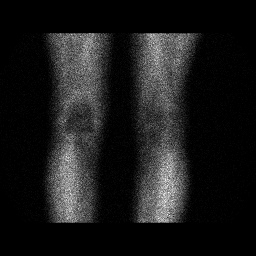

[Series 3: lat bp · 2.07mm/px · 1 of 1 slices shown (1 of 2)]
[im 1/1  full-range]
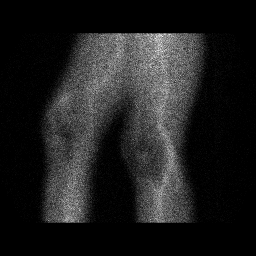

[Series 3: lat bp · 2.07mm/px · 1 of 1 slices shown (2 of 2)]
[im 1/1  full-range]
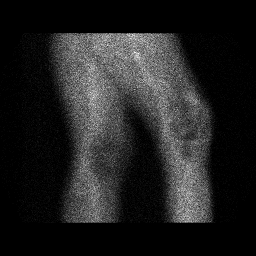

[Series 4: delay · delayed · 2.07mm/px · 1 of 1 slices shown (1 of 4)]
[im 1/1]
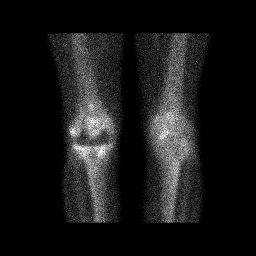

[Series 4: delay · delayed · 2.07mm/px · 1 of 1 slices shown (2 of 4)]
[im 1/1  full-range]
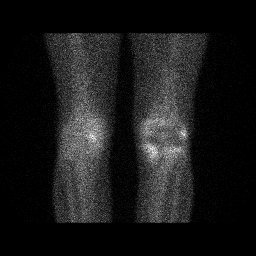

[Series 5: delay · delayed · 2.07mm/px · 1 of 1 slices shown (3 of 4)]
[im 1/1]
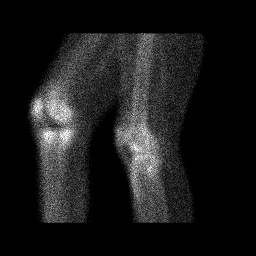

[Series 5: delay · delayed · 2.07mm/px · 1 of 1 slices shown (4 of 4)]
[im 1/1]
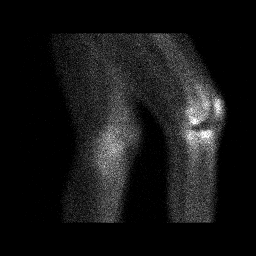

[20 of 20 positions shown; findings below may reference images not displayed]

FINDINGS: Vascular phase: Normal physiologic distribution of radiotracer.

Blood pool phase: Photopenia related to right knee arthroplasty. No
abnormal radiotracer deposition.

Delayed phase: Photopenia related to right knee arthroplasty. Low
level activity surrounding the 3 component right knee arthroplasty
consistent with postsurgical change. Low level activity within the
medial compartment of the left knee consistent with degenerative
change.
IMPRESSION: 1. No scintigraphic evidence of right knee prosthesis loosening or
infection. Low level activity surrounding the prosthesis on delayed
imaging is consistent with normal postsurgical uptake.
2. Mild degenerative type uptake within the medial compartment of
the left knee.

## 2022-09-30 DIAGNOSIS — J0100 Acute maxillary sinusitis, unspecified: Secondary | ICD-10-CM | POA: Diagnosis not present

## 2022-10-24 ENCOUNTER — Other Ambulatory Visit: Payer: Self-pay | Admitting: *Deleted

## 2022-10-26 ENCOUNTER — Other Ambulatory Visit: Payer: Self-pay

## 2022-10-26 DIAGNOSIS — Z79899 Other long term (current) drug therapy: Secondary | ICD-10-CM | POA: Diagnosis not present

## 2022-10-26 DIAGNOSIS — G894 Chronic pain syndrome: Secondary | ICD-10-CM | POA: Diagnosis not present

## 2022-10-26 DIAGNOSIS — M5136 Other intervertebral disc degeneration, lumbar region: Secondary | ICD-10-CM | POA: Diagnosis not present

## 2022-10-26 DIAGNOSIS — T8484XA Pain due to internal orthopedic prosthetic devices, implants and grafts, initial encounter: Secondary | ICD-10-CM | POA: Diagnosis not present

## 2022-10-26 DIAGNOSIS — Z5181 Encounter for therapeutic drug level monitoring: Secondary | ICD-10-CM | POA: Diagnosis not present

## 2022-10-26 DIAGNOSIS — G8929 Other chronic pain: Secondary | ICD-10-CM | POA: Insufficient documentation

## 2022-10-26 DIAGNOSIS — M5459 Other low back pain: Secondary | ICD-10-CM | POA: Diagnosis not present

## 2022-10-26 DIAGNOSIS — M542 Cervicalgia: Secondary | ICD-10-CM | POA: Diagnosis not present

## 2022-10-26 MED ORDER — RANOLAZINE ER 500 MG PO TB12: 500.0000 mg | ORAL_TABLET | Freq: Two times a day (BID) | ORAL | 1 refills | Status: DC

## 2022-11-10 ENCOUNTER — Other Ambulatory Visit: Payer: Self-pay

## 2022-11-10 MED ORDER — NITROGLYCERIN 0.4 MG SL SUBL: 0.4000 mg | SUBLINGUAL_TABLET | SUBLINGUAL | 1 refills | Status: DC | PRN

## 2022-11-13 DIAGNOSIS — T8484XA Pain due to internal orthopedic prosthetic devices, implants and grafts, initial encounter: Secondary | ICD-10-CM | POA: Diagnosis not present

## 2022-11-13 DIAGNOSIS — G5781 Other specified mononeuropathies of right lower limb: Secondary | ICD-10-CM | POA: Diagnosis not present

## 2022-11-15 DIAGNOSIS — T8484XA Pain due to internal orthopedic prosthetic devices, implants and grafts, initial encounter: Secondary | ICD-10-CM | POA: Diagnosis not present

## 2022-12-25 DIAGNOSIS — M25561 Pain in right knee: Secondary | ICD-10-CM | POA: Diagnosis not present

## 2022-12-25 DIAGNOSIS — Z79899 Other long term (current) drug therapy: Secondary | ICD-10-CM | POA: Diagnosis not present

## 2022-12-25 DIAGNOSIS — E7800 Pure hypercholesterolemia, unspecified: Secondary | ICD-10-CM | POA: Diagnosis not present

## 2022-12-25 DIAGNOSIS — I700 Atherosclerosis of aorta: Secondary | ICD-10-CM | POA: Diagnosis not present

## 2022-12-25 DIAGNOSIS — I10 Essential (primary) hypertension: Secondary | ICD-10-CM | POA: Diagnosis not present

## 2022-12-25 DIAGNOSIS — G8929 Other chronic pain: Secondary | ICD-10-CM | POA: Diagnosis not present

## 2022-12-25 DIAGNOSIS — I25119 Atherosclerotic heart disease of native coronary artery with unspecified angina pectoris: Secondary | ICD-10-CM | POA: Diagnosis not present

## 2022-12-25 DIAGNOSIS — R42 Dizziness and giddiness: Secondary | ICD-10-CM | POA: Diagnosis not present

## 2023-02-10 ENCOUNTER — Other Ambulatory Visit: Payer: Self-pay | Admitting: Physician Assistant

## 2023-02-12 NOTE — Telephone Encounter (Signed)
Former pt of Dr. Katrinka Blazing. Now scheduled to see Dr. Anne Fu. Please review for refill. Thank you!

## 2023-02-26 DIAGNOSIS — M542 Cervicalgia: Secondary | ICD-10-CM | POA: Diagnosis not present

## 2023-02-26 DIAGNOSIS — G894 Chronic pain syndrome: Secondary | ICD-10-CM | POA: Diagnosis not present

## 2023-02-26 DIAGNOSIS — M5136 Other intervertebral disc degeneration, lumbar region: Secondary | ICD-10-CM | POA: Diagnosis not present

## 2023-02-26 DIAGNOSIS — M5459 Other low back pain: Secondary | ICD-10-CM | POA: Diagnosis not present

## 2023-02-26 DIAGNOSIS — T8484XA Pain due to internal orthopedic prosthetic devices, implants and grafts, initial encounter: Secondary | ICD-10-CM | POA: Diagnosis not present

## 2023-03-14 DIAGNOSIS — L570 Actinic keratosis: Secondary | ICD-10-CM | POA: Diagnosis not present

## 2023-03-14 DIAGNOSIS — L578 Other skin changes due to chronic exposure to nonionizing radiation: Secondary | ICD-10-CM | POA: Diagnosis not present

## 2023-03-14 DIAGNOSIS — Z85828 Personal history of other malignant neoplasm of skin: Secondary | ICD-10-CM | POA: Diagnosis not present

## 2023-04-23 ENCOUNTER — Encounter: Payer: Self-pay | Admitting: Cardiology

## 2023-04-23 ENCOUNTER — Ambulatory Visit: Payer: Medicare Other | Attending: Cardiology | Admitting: Cardiology

## 2023-04-23 VITALS — BP 110/68 | HR 57 | Ht 70.0 in | Wt 190.0 lb

## 2023-04-23 DIAGNOSIS — I10 Essential (primary) hypertension: Secondary | ICD-10-CM

## 2023-04-23 DIAGNOSIS — I2510 Atherosclerotic heart disease of native coronary artery without angina pectoris: Secondary | ICD-10-CM | POA: Diagnosis not present

## 2023-04-23 DIAGNOSIS — E785 Hyperlipidemia, unspecified: Secondary | ICD-10-CM | POA: Diagnosis not present

## 2023-04-23 NOTE — Progress Notes (Signed)
  Cardiology Office Note:  .   Date:  04/23/2023  ID:  Edward [name], DOB 1950-08-26, MRN [id number] PCP: Emilio Aspen, MD  [facility] Providers Cardiologist:  Donato Schultz, MD     History of Present Illness: .   Edward [name] is a 72 y.o. male Discussed with the use of AI scribe   History of Present Illness   The patient, a 72 year old with a history of nonobstructive multivessel coronary artery disease, managed medically with aspirin, nitroglycerin as needed, Ranexa, and Crestor, presents for follow-up. The patient's most recent LDL was 63 in 2024. He is not currently on any blood pressure medications due to low blood pressure. In the past, the addition of isosorbide has been challenging due to side effects, including a sensation of knee buckling and worsening vertigo.  The patient reports feeling generally well, with occasional fatigue. He describes episodes of energy depletion, sometimes after minimal exertion such as walking from the bedroom to the living room. However, at other times, he can engage in activities without issue.  The patient had previously been on metoprolol but discontinued it due to significant side effects, including lethargy and a lack of motivation to engage in daily activities. Since discontinuing metoprolol, the patient reports feeling back to normal and able to do what he wants.  Despite his medical conditions, the patient maintains an active lifestyle, attending his grandchildren's ball games, going to church, and doing yard work. He reports no new symptoms or changes in his condition.       Studies Reviewed: Marland Kitchen   EKG Interpretation Date/Time:  Monday April 23 2023 08:57:31 EDT Ventricular Rate:  57 PR Interval:  136 QRS Duration:  84 QT Interval:  426 QTC Calculation: 414 R Axis:   22  Text Interpretation: Sinus bradycardia When compared with ECG of 18-Jul-2017 02:24, No significant change was found Confirmed by Donato Schultz ([id number]) on  04/23/2023 9:11:50 AM    Results LABS LDL: 63 (2024)  DIAGNOSTIC Cardiac catheterization: Nonobstructive multivessel disease, diffuse, treated medically (2023) EKG: Normal  Diagnostic Dominance: Right  Risk Assessment/Calculations:            Physical Exam:   VS:  BP 110/68   Pulse (!) 57   Ht 5\' 10"  (1.778 m)   Wt 190 lb (86.2 kg)   SpO2 95%   BMI 27.26 kg/m    Wt Readings from Last 3 Encounters:  04/23/23 190 lb (86.2 kg)  05/05/22 184 lb 6.4 oz (83.6 kg)  09/29/21 194 lb 12.8 oz (88.4 kg)    GEN: Well nourished, well developed in no acute distress NECK: No JVD; No carotid bruits CARDIAC: RRR, no murmurs, no rubs, no gallops RESPIRATORY:  Clear to auscultation without rales, wheezing or rhonchi  ABDOMEN: Soft, non-tender, non-distended EXTREMITIES:  No edema; No deformity   ASSESSMENT AND PLAN: .    Assessment and Plan    Coronary Artery Disease Hyperlipidemia Nonobstructive multivessel disease, managed medically. Patient reports occasional fatigue but remains active. LDL well controlled on Crestor 40mg  daily (LDL 63 in 2024). -Continue Aspirin 81mg  daily, Nitroglycerin as needed, Ranexa 500mg  twice daily, and Crestor 40mg  daily. -Discontinued Metoprolol due to patient-reported side effects and low blood pressure.  Follow-up in 1 year or sooner if symptoms change.               Signed, Donato Schultz, MD

## 2023-04-23 NOTE — Patient Instructions (Signed)
Medication Instructions:  The current medical regimen is effective;  continue present plan and medications.  *If you need a refill on your cardiac medications before your next appointment, please call your pharmacy*   Follow-Up: At [facility], you and your health needs are our priority.  As part of our continuing mission to provide you with exceptional heart care, we have created designated Provider Care Teams.  These Care Teams include your primary Cardiologist (physician) and Advanced Practice Providers (APPs -  Physician Assistants and Nurse Practitioners) who all work together to provide you with the care you need, when you need it.  We recommend signing up for the patient portal called "MyChart".  Sign up information is provided on this After Visit Summary.  MyChart is used to connect with patients for Virtual Visits (Telemedicine).  Patients are able to view lab/test results, encounter notes, upcoming appointments, etc.  Non-urgent messages can be sent to your provider as well.   To learn more about what you can do with MyChart, go to [email or web address].    Your next appointment:   1 year(s)  Provider:   Dr Donato Schultz

## 2023-05-16 ENCOUNTER — Other Ambulatory Visit: Payer: Self-pay | Admitting: Cardiology

## 2023-05-23 ENCOUNTER — Other Ambulatory Visit: Payer: Self-pay | Admitting: Physician Assistant

## 2023-05-31 ENCOUNTER — Other Ambulatory Visit: Payer: Self-pay | Admitting: Physician Assistant

## 2023-06-27 DIAGNOSIS — M7542 Impingement syndrome of left shoulder: Secondary | ICD-10-CM | POA: Insufficient documentation

## 2023-06-27 DIAGNOSIS — M542 Cervicalgia: Secondary | ICD-10-CM | POA: Diagnosis not present

## 2023-06-27 DIAGNOSIS — G894 Chronic pain syndrome: Secondary | ICD-10-CM | POA: Diagnosis not present

## 2023-06-27 DIAGNOSIS — M5459 Other low back pain: Secondary | ICD-10-CM | POA: Diagnosis not present

## 2023-06-27 DIAGNOSIS — M25511 Pain in right shoulder: Secondary | ICD-10-CM | POA: Diagnosis not present

## 2023-06-27 DIAGNOSIS — T8484XA Pain due to internal orthopedic prosthetic devices, implants and grafts, initial encounter: Secondary | ICD-10-CM | POA: Diagnosis not present

## 2023-06-27 DIAGNOSIS — M51362 Other intervertebral disc degeneration, lumbar region with discogenic back pain and lower extremity pain: Secondary | ICD-10-CM | POA: Diagnosis not present

## 2023-06-27 DIAGNOSIS — M25562 Pain in left knee: Secondary | ICD-10-CM | POA: Insufficient documentation

## 2023-06-29 DIAGNOSIS — M25561 Pain in right knee: Secondary | ICD-10-CM | POA: Diagnosis not present

## 2023-06-29 DIAGNOSIS — I10 Essential (primary) hypertension: Secondary | ICD-10-CM | POA: Diagnosis not present

## 2023-06-29 DIAGNOSIS — I700 Atherosclerosis of aorta: Secondary | ICD-10-CM | POA: Diagnosis not present

## 2023-06-29 DIAGNOSIS — K219 Gastro-esophageal reflux disease without esophagitis: Secondary | ICD-10-CM | POA: Diagnosis not present

## 2023-06-29 DIAGNOSIS — G8929 Other chronic pain: Secondary | ICD-10-CM | POA: Diagnosis not present

## 2023-06-29 DIAGNOSIS — Z0000 Encounter for general adult medical examination without abnormal findings: Secondary | ICD-10-CM | POA: Diagnosis not present

## 2023-06-29 DIAGNOSIS — R7303 Prediabetes: Secondary | ICD-10-CM | POA: Diagnosis not present

## 2023-06-29 DIAGNOSIS — I25119 Atherosclerotic heart disease of native coronary artery with unspecified angina pectoris: Secondary | ICD-10-CM | POA: Diagnosis not present

## 2023-06-29 DIAGNOSIS — E7800 Pure hypercholesterolemia, unspecified: Secondary | ICD-10-CM | POA: Diagnosis not present

## 2023-06-29 DIAGNOSIS — Z79899 Other long term (current) drug therapy: Secondary | ICD-10-CM | POA: Diagnosis not present

## 2023-07-02 DIAGNOSIS — H524 Presbyopia: Secondary | ICD-10-CM | POA: Diagnosis not present

## 2023-07-02 DIAGNOSIS — H43391 Other vitreous opacities, right eye: Secondary | ICD-10-CM | POA: Diagnosis not present

## 2023-07-02 DIAGNOSIS — H25013 Cortical age-related cataract, bilateral: Secondary | ICD-10-CM | POA: Diagnosis not present

## 2023-07-02 DIAGNOSIS — H52223 Regular astigmatism, bilateral: Secondary | ICD-10-CM | POA: Diagnosis not present

## 2023-07-02 DIAGNOSIS — H35362 Drusen (degenerative) of macula, left eye: Secondary | ICD-10-CM | POA: Diagnosis not present

## 2023-07-02 DIAGNOSIS — H5203 Hypermetropia, bilateral: Secondary | ICD-10-CM | POA: Diagnosis not present

## 2023-08-14 DIAGNOSIS — L720 Epidermal cyst: Secondary | ICD-10-CM | POA: Diagnosis not present

## 2023-08-14 DIAGNOSIS — D225 Melanocytic nevi of trunk: Secondary | ICD-10-CM | POA: Diagnosis not present

## 2023-08-14 DIAGNOSIS — L821 Other seborrheic keratosis: Secondary | ICD-10-CM | POA: Diagnosis not present

## 2023-08-14 DIAGNOSIS — D485 Neoplasm of uncertain behavior of skin: Secondary | ICD-10-CM | POA: Diagnosis not present

## 2023-08-14 DIAGNOSIS — L814 Other melanin hyperpigmentation: Secondary | ICD-10-CM | POA: Diagnosis not present

## 2023-08-14 DIAGNOSIS — Z85828 Personal history of other malignant neoplasm of skin: Secondary | ICD-10-CM | POA: Diagnosis not present

## 2023-08-14 DIAGNOSIS — H61001 Unspecified perichondritis of right external ear: Secondary | ICD-10-CM | POA: Diagnosis not present

## 2023-08-14 DIAGNOSIS — D2261 Melanocytic nevi of right upper limb, including shoulder: Secondary | ICD-10-CM | POA: Diagnosis not present

## 2023-08-14 DIAGNOSIS — L570 Actinic keratosis: Secondary | ICD-10-CM | POA: Diagnosis not present

## 2023-08-14 DIAGNOSIS — B351 Tinea unguium: Secondary | ICD-10-CM | POA: Diagnosis not present

## 2023-08-21 DIAGNOSIS — M25511 Pain in right shoulder: Secondary | ICD-10-CM | POA: Diagnosis not present

## 2023-08-21 DIAGNOSIS — M25512 Pain in left shoulder: Secondary | ICD-10-CM | POA: Diagnosis not present

## 2023-08-24 DIAGNOSIS — M25562 Pain in left knee: Secondary | ICD-10-CM | POA: Diagnosis not present

## 2023-09-28 DIAGNOSIS — I10 Essential (primary) hypertension: Secondary | ICD-10-CM | POA: Diagnosis not present

## 2023-09-28 DIAGNOSIS — L03818 Cellulitis of other sites: Secondary | ICD-10-CM | POA: Diagnosis not present

## 2023-11-12 DIAGNOSIS — G894 Chronic pain syndrome: Secondary | ICD-10-CM | POA: Diagnosis not present

## 2023-11-12 DIAGNOSIS — G8929 Other chronic pain: Secondary | ICD-10-CM | POA: Diagnosis not present

## 2023-11-12 DIAGNOSIS — M5416 Radiculopathy, lumbar region: Secondary | ICD-10-CM | POA: Diagnosis not present

## 2023-11-12 DIAGNOSIS — Z79899 Other long term (current) drug therapy: Secondary | ICD-10-CM | POA: Diagnosis not present

## 2023-11-27 DIAGNOSIS — M5416 Radiculopathy, lumbar region: Secondary | ICD-10-CM | POA: Diagnosis not present

## 2023-12-14 ENCOUNTER — Other Ambulatory Visit: Payer: Self-pay | Admitting: Physician Assistant

## 2024-01-30 DIAGNOSIS — E7800 Pure hypercholesterolemia, unspecified: Secondary | ICD-10-CM | POA: Diagnosis not present

## 2024-01-30 DIAGNOSIS — B351 Tinea unguium: Secondary | ICD-10-CM | POA: Diagnosis not present

## 2024-01-30 DIAGNOSIS — R7303 Prediabetes: Secondary | ICD-10-CM | POA: Diagnosis not present

## 2024-01-30 DIAGNOSIS — I209 Angina pectoris, unspecified: Secondary | ICD-10-CM | POA: Diagnosis not present

## 2024-01-30 DIAGNOSIS — K219 Gastro-esophageal reflux disease without esophagitis: Secondary | ICD-10-CM | POA: Diagnosis not present

## 2024-01-30 DIAGNOSIS — I10 Essential (primary) hypertension: Secondary | ICD-10-CM | POA: Diagnosis not present

## 2024-01-30 DIAGNOSIS — G8929 Other chronic pain: Secondary | ICD-10-CM | POA: Diagnosis not present

## 2024-01-30 DIAGNOSIS — M25561 Pain in right knee: Secondary | ICD-10-CM | POA: Diagnosis not present

## 2024-01-31 ENCOUNTER — Other Ambulatory Visit: Payer: Self-pay | Admitting: Cardiology

## 2024-02-07 DIAGNOSIS — E7800 Pure hypercholesterolemia, unspecified: Secondary | ICD-10-CM | POA: Diagnosis not present

## 2024-02-07 DIAGNOSIS — I10 Essential (primary) hypertension: Secondary | ICD-10-CM | POA: Diagnosis not present

## 2024-02-12 ENCOUNTER — Ambulatory Visit: Admitting: Podiatry

## 2024-02-12 ENCOUNTER — Other Ambulatory Visit: Payer: Self-pay | Admitting: Physician Assistant

## 2024-02-15 ENCOUNTER — Other Ambulatory Visit: Payer: Self-pay

## 2024-02-15 ENCOUNTER — Telehealth: Payer: Self-pay | Admitting: Cardiology

## 2024-02-15 MED ORDER — RANOLAZINE ER 500 MG PO TB12: 500.0000 mg | ORAL_TABLET | Freq: Two times a day (BID) | ORAL | 0 refills | Status: AC

## 2024-02-15 NOTE — Telephone Encounter (Signed)
*  STAT* If patient is at the pharmacy, call can be transferred to refill team.   1. Which medications need to be refilled? (please list name of each medication and dose if known) ranolazine  (RANEXA ) 500 MG 12 hr tablet    2. Would you like to learn more about the convenience, safety, & potential cost savings by using the [facility]? No   3. Are you open to using the [facility] (Type [facility] No   4. Which pharmacy/location (including street and city if local pharmacy) is medication to be sent to? [facility] - [place], [place] - [address]    5. Do they need a 30 day or 90 day supply? 90 day

## 2024-02-18 ENCOUNTER — Encounter: Payer: Self-pay | Admitting: Podiatry

## 2024-02-18 ENCOUNTER — Ambulatory Visit: Admitting: Podiatry

## 2024-02-18 DIAGNOSIS — B351 Tinea unguium: Secondary | ICD-10-CM | POA: Diagnosis not present

## 2024-02-18 NOTE — Progress Notes (Signed)
 Subjective:  Patient ID: Edward [name], male    DOB: 1950-12-06,  MRN: [id number]  Edward [name] presents to clinic today for concern of thick fungal nails.  States that they are uncomfortable in his shoes.  He would like to start coming regularly to have his nails trimmed.  He states that they are painful.  The nails that are most concerning are the bilateral hallux nails due to the thickening and discoloration.  PCP is Edward Dorn LABOR, MD.  Past Medical History:  Diagnosis Date   Anginal pain (HCC)    last NTG 3-4 months ago   Anxiety    claustrophobic   Arthritis    throughout arms, knees & back & neck   CAD (coronary artery disease)    with high grade obstruction second obtuse marginal and diffuse LAD and circumflex disease by cath 2010   Cancer [facility])    skin cancer, on R ear - basal cell    Cold sore    right upper lip, healing well   Colon polyp    Elevated cholesterol    LDL Goal < 70   Erectile dysfunction    GERD (gastroesophageal reflux disease)    has not had problem several months   History of nonmelanoma skin cancer    History of nuclear stress test 5/10   no ischemia   Hypertension    Past Surgical History:  Procedure Laterality Date   ANTERIOR CERVICAL DECOMP/DISCECTOMY FUSION N/A 05/29/2013   Procedure: Cervical five-six Anterior cervical decompression/diskectomy/fusion with exploration of Cervical six-seven, hardware removal ;  Surgeon: Fairy Levels, MD;  [facility]: [facility];  Service: Neurosurgery;  Laterality: N/A;  Cervical five-six Anterior cervical decompression/diskectomy/fusion with exploration of Cervical six-seven, Hardware removal    BACK SURGERY     lower   CARDIAC CATHETERIZATION     2004 & 2/10, normal LV function   CERVICAL FUSION     COLONOSCOPY WITH PROPOFOL  N/A 05/14/2014   Procedure: COLONOSCOPY WITH PROPOFOL ;  Surgeon: Gladis MARLA Louder, MD;  Location: [facility];  Service: Endoscopy;  Laterality: N/A;   FINGER FRACTURE  SURGERY     by Dr. Leonor   I & D KNEE WITH POLY EXCHANGE Right 05/08/2016   Procedure: POLY EXCHANGE;  Surgeon: Donnice Car, MD;  Location: [facility];  Service: Orthopedics;  Laterality: Right;   KNEE ARTHROSCOPY  06/13/2011   Procedure: ARTHROSCOPY KNEE;  Surgeon: Tanda [name] Heading;  Location: [facility];  Service: Orthopedics;  Laterality: Right;  Right Knee Arthroscopy with Medial Menisectomy   LEFT HEART CATH AND CORONARY ANGIOGRAPHY N/A 10/09/2017   Procedure: LEFT HEART CATH AND CORONARY ANGIOGRAPHY;  Surgeon: Claudene Victory ORN, MD;  [facility]: [facility];  Service: Cardiovascular;  Laterality: N/A;   LEFT HEART CATH AND CORONARY ANGIOGRAPHY N/A 09/06/2021   Procedure: LEFT HEART CATH AND CORONARY ANGIOGRAPHY;  Surgeon: Claudene Victory ORN, MD;  [facility]: [facility];  Service: Cardiovascular;  Laterality: N/A;   MAXIMUM ACCESS (MAS)POSTERIOR LUMBAR INTERBODY FUSION (PLIF) 2 LEVEL N/A 01/04/2017   Procedure: Lumbar four- five Lumbar five-Sacral one Maximum access posterior lumbar interbody fusion;  Surgeon: Levels Fairy, MD;  Location: [facility] OR;  Service: Neurosurgery;  Laterality: N/A;  Lumbar four- five Lumbar five-Sacral one Maximum access posterior lumbar interbody fusion   NECK SURGERY  05/29/2013   SCAR DEBRIDEMENT OF TOTAL KNEE Right 05/08/2016   Procedure: SCAR DEBRIDEMENT OF TOTAL KNEE, EXCISION OF SAPHENOUS NEUROMA;  Surgeon: Donnice Car,  MD;  Location: [facility];  Service: Orthopedics;  Laterality: Right;   SHOULDER SURGERY     2 left / x3 right   SKIN CANCER EXCISION     SKIN SURGERY     nose and right ear skin cancer surgery   TOTAL KNEE ARTHROPLASTY Right 03/30/2015   Procedure: RIGHT TOTAL KNEE ARTHROPLASTY;  Surgeon: Tanda Heading, MD;  [facility]: [facility];  Service: Orthopedics;  Laterality: Right;   Allergies  Allergen Reactions   Nsaids Other (See Comments)    UNSPECIFIED REACTION. Heart troubles - MD suggested stopping   Percocet [Oxycodone -Acetaminophen ] Other (See Comments)     feel funny    Review of Systems: Negative except as noted in the HPI.  Objective:  Vascular Examination: Capillary refill time is 3-5 seconds to toes bilateral. Palpable pedal pulses b/l LE. Digital hair present b/l.    Dermatological Examination: Pedal skin with normal turgor, texture and tone b/l. No open wounds. No interdigital macerations b/l.  The bilateral hallux toenails are 3mm thick, discolored, dystrophic with subungual debris. There is pain with compression of the nail plates.    Assessment/Plan: 1. Fungal nail infection     The mycotic toenails were sharply debrided x 2 with sterile nail nippers and a power debriding burr to decrease bulk/thickness and length.    Clippings of the affected toenails were obtained and [facility] nail culture.  Informed patient it may take up to 3 weeks to receive the final report.  Will contact the patient to review the results.  We will discuss treatment plan at that time and follow-up.  Discussed topical, oral, and laser nail treatment options with the patient today.  Discussed risks involved as well.    Edward [name], DPM, FACFAS [facility]     [address] [place], [place] [address]                Office [phone]  Fax [phone]

## 2024-02-29 ENCOUNTER — Ambulatory Visit: Payer: Self-pay | Admitting: Podiatry

## 2024-02-29 ENCOUNTER — Telehealth: Payer: Self-pay | Admitting: Cardiology

## 2024-02-29 MED ORDER — ROSUVASTATIN CALCIUM 40 MG PO TABS: 40.0000 mg | ORAL_TABLET | Freq: Every day | ORAL | 0 refills | Status: DC

## 2024-02-29 NOTE — Telephone Encounter (Signed)
*  STAT* If patient is at the pharmacy, call can be transferred to refill team.   1. Which medications need to be refilled? (please list name of each medication and dose if known)   rosuvastatin  (CRESTOR ) 40 MG tablet    2. Which pharmacy/location (including street and city if local pharmacy) is medication to be sent to? [facility] Delivery - [place], [place] - [address]   3. Do they need a 30 day or 90 day supply? 90

## 2024-02-29 NOTE — Telephone Encounter (Signed)
 Pt's medication was sent to pt's pharmacy as requested. Confirmation received.

## 2024-03-06 NOTE — Progress Notes (Signed)
 Spoke with patient and he will discuss treatment options with Dr. Awanda on his Nov apt. He is using a laser machine at home that he purchased.

## 2024-03-13 DIAGNOSIS — M25512 Pain in left shoulder: Secondary | ICD-10-CM | POA: Diagnosis not present

## 2024-03-13 DIAGNOSIS — M25562 Pain in left knee: Secondary | ICD-10-CM | POA: Diagnosis not present

## 2024-03-13 DIAGNOSIS — M51362 Other intervertebral disc degeneration, lumbar region with discogenic back pain and lower extremity pain: Secondary | ICD-10-CM | POA: Diagnosis not present

## 2024-03-13 DIAGNOSIS — M25511 Pain in right shoulder: Secondary | ICD-10-CM | POA: Diagnosis not present

## 2024-03-17 ENCOUNTER — Telehealth: Payer: Self-pay | Admitting: Lab

## 2024-03-17 MED ORDER — CICLOPIROX 8 % EX SOLN: Freq: Every day | CUTANEOUS | 11 refills | Status: AC

## 2024-03-17 NOTE — Telephone Encounter (Signed)
Thanks Cindy

## 2024-03-17 NOTE — Addendum Note (Signed)
 Addended byBETHA LOEL LAIS D on: 03/17/2024 02:16 PM   Modules accepted: Orders

## 2024-03-17 NOTE — Telephone Encounter (Signed)
 Patient is requesting topical medication to be called in no medication was provided in regards to last result note.

## 2024-03-27 DIAGNOSIS — M5416 Radiculopathy, lumbar region: Secondary | ICD-10-CM | POA: Diagnosis not present

## 2024-04-17 ENCOUNTER — Other Ambulatory Visit: Payer: Self-pay | Admitting: Physician Assistant

## 2024-05-13 ENCOUNTER — Ambulatory Visit: Attending: Cardiology | Admitting: Cardiology

## 2024-05-13 ENCOUNTER — Other Ambulatory Visit: Payer: Self-pay | Admitting: Cardiology

## 2024-05-13 ENCOUNTER — Encounter: Payer: Self-pay | Admitting: Cardiology

## 2024-05-13 VITALS — BP 112/60 | HR 65 | Ht 70.0 in | Wt 189.8 lb

## 2024-05-13 DIAGNOSIS — E785 Hyperlipidemia, unspecified: Secondary | ICD-10-CM | POA: Diagnosis not present

## 2024-05-13 DIAGNOSIS — M79604 Pain in right leg: Secondary | ICD-10-CM | POA: Diagnosis not present

## 2024-05-13 DIAGNOSIS — I2510 Atherosclerotic heart disease of native coronary artery without angina pectoris: Secondary | ICD-10-CM

## 2024-05-13 DIAGNOSIS — I10 Essential (primary) hypertension: Secondary | ICD-10-CM

## 2024-05-13 DIAGNOSIS — M79605 Pain in left leg: Secondary | ICD-10-CM

## 2024-05-13 LAB — LIPID PANEL

## 2024-05-13 NOTE — Patient Instructions (Addendum)
   Medication Instructions:  NO CHANGES  *If you need a refill on your cardiac medications before your next appointment, please call your pharmacy*  Lab Work: LIPID PANEL - 1st floor  If you have labs (blood work) drawn today and your tests are completely normal, you will receive your results only by: MyChart Message (if you have MyChart) OR A paper copy in the mail If you have any lab test that is abnormal or we need to change your treatment, we will call you to review the results.  Testing/Procedures: Lower Extremity Arterial Doppler  Follow-Up: At [facility], you and your health needs are our priority.  As part of our continuing mission to provide you with exceptional heart care, our providers are all part of one team.  This team includes your primary Cardiologist (physician) and Advanced Practice Providers or APPs (Physician Assistants and Nurse Practitioners) who all work together to provide you with the care you need, when you need it.  Your next appointment:   12 months with Dr. Jeffrie  We recommend signing up for the patient portal called MyChart.  Sign up information is provided on this After Visit Summary.  MyChart is used to connect with patients for Virtual Visits (Telemedicine).  Patients are able to view lab/test results, encounter notes, upcoming appointments, etc.  Non-urgent messages can be sent to your provider as well.   To learn more about what you can do with MyChart, go to [email or web address].   Other Instructions

## 2024-05-13 NOTE — Progress Notes (Signed)
 Cardiology Office Note:  .   Date:  05/13/2024  ID:  BELVIN GAUSS, DOB [birth year], MRN [id number] PCP: Charlott Dorn LABOR, MD  [facility] Providers Cardiologist:  Oneil Parchment, MD    History of Present Illness: .   Edward [name] is a 73 y.o. male Discussed the use of AI scribe software   History of Present Illness Edward [name] is a 73 year old male with coronary artery disease who presents for follow-up after cardiac catheterization.  He underwent cardiac catheterization on September 06, 2021, which revealed diffuse coronary artery disease with an ejection fraction of 55% and an end-diastolic pressure of 12 mmHg. The procedure showed nonobstructive multivessel coronary artery disease, and no stents were placed.  Following the catheterization, metoprolol  was discontinued due to side effects, and Ranexa  (500 mg twice a day) was initiated. He is also on Crestor  (40 mg daily) and reports adherence, although he missed taking it for about four to five days recently. His LDL cholesterol was 63 in 7976 but increased to 106 in December 2024.  No recent chest pain. He experiences pain in his right leg, which he describes as different from the pain associated with his previous knee replacements. He has undergone knee replacement surgery twice and anticipates another due to ongoing issues. He is concerned that the leg pain might be related to vascular issues.       Studies Reviewed: SABRA   EKG Interpretation Date/Time:  Tuesday May 13 2024 08:39:43 EST Ventricular Rate:  65 PR Interval:  140 QRS Duration:  80 QT Interval:  414 QTC Calculation: 430 R Axis:   10  Text Interpretation: Sinus rhythm with occasional Premature ventricular complexes When compared with ECG of 23-Apr-2023 08:57, Premature ventricular complexes are now Present Confirmed by Parchment Oneil ([id number]) on 05/13/2024 9:07:55 AM    Results LABS LDL: 106 (2024) LDL: 63 (2023)  DIAGNOSTIC Cardiac catheterization:  Diffuse coronary artery disease (CAD), ejection fraction (EF) 55%, end-diastolic pressure (EDP) 12 mmHg (09/06/2021) Electrocardiogram (EKG): Normal Risk Assessment/Calculations:            Physical Exam:   VS:  BP 112/60   Pulse 65   Ht 5' 10 (1.778 m)   Wt 189 lb 12.8 oz (86.1 kg)   SpO2 98%   BMI 27.23 kg/m    Wt Readings from Last 3 Encounters:  05/13/24 189 lb 12.8 oz (86.1 kg)  04/23/23 190 lb (86.2 kg)  05/05/22 184 lb 6.4 oz (83.6 kg)    GEN: Well nourished, well developed in no acute distress NECK: No JVD; No carotid bruits CARDIAC: RRR, no murmurs, no rubs, no gallops RESPIRATORY:  Clear to auscultation without rales, wheezing or rhonchi  ABDOMEN: Soft, non-tender, non-distended EXTREMITIES:  No edema; No deformity   ASSESSMENT AND PLAN: .    Assessment and Plan Assessment & Plan Coronary artery disease, nonobstructive, status post cardiac catheterization Nonobstructive multivessel coronary artery disease confirmed by cardiac catheterization on February 28th, 2023. Ejection fraction is 55% with an end-diastolic pressure of 12 mmHg. No stents were required post-catheterization. Currently managed with Ranexa  and Crestor . No recent chest pain reported. - Continue Ranexa  500 mg oral twice daily - Continue Crestor  40 mg oral daily  Hyperlipidemia on statin therapy LDL cholesterol was 63 mg/dL in 7976 but increased to 106 mg/dL in December 2024 due to a temporary discontinuation of Crestor  for 4-5 days. He has resumed Crestor  without issues. - Checked cholesterol levels today with lab work - Continue  Crestor  40 mg oral daily  Right leg pain, evaluation for peripheral vascular disease Reports right leg pain, possibly related to peripheral vascular disease. No swelling observed. Differential includes peripheral vascular disease due to potential coronary artery disease involvement. - Ordered ultrasound of both legs to evaluate blood flow - Scheduled ultrasound  appointment         Dispo: 1 yr  Signed, Oneil Parchment, MD

## 2024-05-14 LAB — LIPID PANEL
Cholesterol, Total: 156 mg/dL (ref 100–199)
HDL: 51 mg/dL (ref >39)
LDL CALC COMMENT:: 3.1 ratio (ref 0.0–5.0)
LDL Chol Calc (NIH): 88 mg/dL (ref 0–99)
Triglycerides: 89 mg/dL (ref 0–149)
VLDL Cholesterol Cal: 17 mg/dL (ref 5–40)

## 2024-05-16 ENCOUNTER — Ambulatory Visit: Payer: Self-pay | Admitting: Cardiology

## 2024-05-16 DIAGNOSIS — E785 Hyperlipidemia, unspecified: Secondary | ICD-10-CM

## 2024-05-16 DIAGNOSIS — Z79899 Other long term (current) drug therapy: Secondary | ICD-10-CM

## 2024-05-19 ENCOUNTER — Encounter: Payer: Self-pay | Admitting: Podiatry

## 2024-05-19 ENCOUNTER — Ambulatory Visit: Admitting: Podiatry

## 2024-05-19 DIAGNOSIS — M79675 Pain in left toe(s): Secondary | ICD-10-CM

## 2024-05-19 DIAGNOSIS — M79674 Pain in right toe(s): Secondary | ICD-10-CM | POA: Diagnosis not present

## 2024-05-19 DIAGNOSIS — B351 Tinea unguium: Secondary | ICD-10-CM

## 2024-05-19 NOTE — Progress Notes (Signed)
 Subjective:  Patient ID: Edward [name], male    DOB: 06/20/1951,  MRN: [id number]  Edward [name] Agent presents to clinic today for:  Chief Complaint  Patient presents with   Nail Problem    F/U nail fungus. Using penac solution and ultraviolet light. Non diabetic   Patient notes nails are thick, discolored, elongated and painful in shoegear when trying to ambulate.  He is using the prescription ciclopirox  solution as well as an over-the-counter ultraviolet light treatment.  He does feel that he has seen improvement since last seen.  PCP is Charlott Dorn LABOR, MD.  Past Medical History:  Diagnosis Date   Anginal pain    last NTG 3-4 months ago   Anxiety    claustrophobic   Arthritis    throughout arms, knees & back & neck   CAD (coronary artery disease)    with high grade obstruction second obtuse marginal and diffuse LAD and circumflex disease by cath 2010   Cancer [facility])    skin cancer, on R ear - basal cell    Cold sore    right upper lip, healing well   Colon polyp    Elevated cholesterol    LDL Goal < 70   Erectile dysfunction    GERD (gastroesophageal reflux disease)    has not had problem several months   History of nonmelanoma skin cancer    History of nuclear stress test 5/10   no ischemia   Hypertension    Past Surgical History:  Procedure Laterality Date   ANTERIOR CERVICAL DECOMP/DISCECTOMY FUSION N/A 05/29/2013   Procedure: Cervical five-six Anterior cervical decompression/diskectomy/fusion with exploration of Cervical six-seven, hardware removal ;  Surgeon: Fairy Levels, MD;  [facility]: [facility];  Service: Neurosurgery;  Laterality: N/A;  Cervical five-six Anterior cervical decompression/diskectomy/fusion with exploration of Cervical six-seven, Hardware removal    BACK SURGERY     lower   CARDIAC CATHETERIZATION     2004 & 2/10, normal LV function   CERVICAL FUSION     COLONOSCOPY WITH PROPOFOL  N/A 05/14/2014   Procedure: COLONOSCOPY WITH PROPOFOL ;   Surgeon: Gladis MARLA Louder, MD;  [facility];  Service: Endoscopy;  Laterality: N/A;   FINGER FRACTURE SURGERY     by Dr. Leonor   I & D KNEE WITH POLY EXCHANGE Right 05/08/2016   Procedure: POLY EXCHANGE;  Surgeon: Donnice Car, MD;  Location: [facility];  Service: Orthopedics;  Laterality: Right;   KNEE ARTHROSCOPY  06/13/2011   Procedure: ARTHROSCOPY KNEE;  Surgeon: Tanda LABOR Heading;  Location: [facility];  Service: Orthopedics;  Laterality: Right;  Right Knee Arthroscopy with Medial Menisectomy   LEFT HEART CATH AND CORONARY ANGIOGRAPHY N/A 10/09/2017   Procedure: LEFT HEART CATH AND CORONARY ANGIOGRAPHY;  Surgeon: Claudene Victory ORN, MD;  [facility];  Service: Cardiovascular;  Laterality: N/A;   LEFT HEART CATH AND CORONARY ANGIOGRAPHY N/A 09/06/2021   Procedure: LEFT HEART CATH AND CORONARY ANGIOGRAPHY;  Surgeon: Claudene Victory ORN, MD;  [facility]: [facility];  Service: Cardiovascular;  Laterality: N/A;   MAXIMUM ACCESS (MAS)POSTERIOR LUMBAR INTERBODY FUSION (PLIF) 2 LEVEL N/A 01/04/2017   Procedure: Lumbar four- five Lumbar five-Sacral one Maximum access posterior lumbar interbody fusion;  Surgeon: Levels Fairy, MD;  Location: [facility];  Service: Neurosurgery;  Laterality: N/A;  Lumbar four- five Lumbar five-Sacral one Maximum access posterior lumbar interbody fusion   NECK SURGERY  05/29/2013   SCAR DEBRIDEMENT OF TOTAL KNEE Right 05/08/2016  Procedure: SCAR DEBRIDEMENT OF TOTAL KNEE, EXCISION OF SAPHENOUS NEUROMA;  Surgeon: Donnice Car, MD;  Location: [facility];  Service: Orthopedics;  Laterality: Right;   SHOULDER SURGERY     2 left / x3 right   SKIN CANCER EXCISION     SKIN SURGERY     nose and right ear skin cancer surgery   TOTAL KNEE ARTHROPLASTY Right 03/30/2015   Procedure: RIGHT TOTAL KNEE ARTHROPLASTY;  Surgeon: Tanda Heading, MD;  [facility];  Service: Orthopedics;  Laterality: Right;   Allergies  Allergen Reactions   Nsaids Other (See Comments)     UNSPECIFIED REACTION. Heart troubles - MD suggested stopping   Oxycodone  Other (See Comments)   Percocet [Oxycodone -Acetaminophen ] Other (See Comments)    feel funny    Review of Systems: Negative except as noted in the HPI.  Objective:  Edward [name] is a pleasant 73 y.o. male in NAD. AAO x 3.  Vascular Examination: Capillary refill time is 3-5 seconds to toes bilateral. Palpable pedal pulses b/l LE. Digital hair present b/l.  Skin temperature gradient WNL b/l. No varicosities b/l. No cyanosis noted b/l.   Dermatological Examination: Pedal skin with normal turgor, texture and tone b/l. No open wounds. No interdigital macerations b/l. Toenails x10 are 3mm thick, discolored, dystrophic with subungual debris. There is pain with compression of the nail plates.  They are elongated x10.  There is proximal clearing of the hallux toenails for about 20%.  Assessment/Plan: 1. Pain due to onychomycosis of toenails of both feet    The mycotic toenails were sharply debrided x10 with sterile nail nippers and a power debriding burr to decrease bulk/thickness and length.    Return in about 3 months (around 08/19/2024) for fungal nail recheck (using topical and UV light).   Awanda CHARM [place], DPM, FACFAS [facility]     [address] [place], [place] [address]                Office [phone]  Fax [phone]

## 2024-05-27 ENCOUNTER — Other Ambulatory Visit: Payer: Self-pay | Admitting: *Deleted

## 2024-05-27 DIAGNOSIS — Z79899 Other long term (current) drug therapy: Secondary | ICD-10-CM

## 2024-05-27 DIAGNOSIS — E785 Hyperlipidemia, unspecified: Secondary | ICD-10-CM

## 2024-05-27 MED ORDER — EZETIMIBE 10 MG PO TABS: 10.0000 mg | ORAL_TABLET | Freq: Every day | ORAL | 3 refills | Status: AC

## 2024-05-27 NOTE — Telephone Encounter (Signed)
 Pt is aware of results and to start zetia 10 mg daily.  He will repeat lipid panel mid-Feb 2026.  Orders placed and released. RX sent into [facility] [address] as requested.

## 2024-05-29 ENCOUNTER — Ambulatory Visit (HOSPITAL_COMMUNITY): Admission: RE | Admit: 2024-05-29 | Discharge: 2024-05-29 | Attending: Cardiology | Admitting: Cardiology

## 2024-05-29 ENCOUNTER — Encounter (HOSPITAL_COMMUNITY): Payer: Self-pay

## 2024-05-29 ENCOUNTER — Ambulatory Visit (HOSPITAL_COMMUNITY)
Admission: RE | Admit: 2024-05-29 | Discharge: 2024-05-29 | Disposition: A | Discharge status: Home / Self Care | Source: Ambulatory Visit | Attending: Cardiology | Admitting: Cardiology

## 2024-05-29 DIAGNOSIS — M79604 Pain in right leg: Secondary | ICD-10-CM

## 2024-05-29 DIAGNOSIS — M79605 Pain in left leg: Secondary | ICD-10-CM | POA: Insufficient documentation

## 2024-05-29 LAB — VAS US ABI WITH/WO TBI
Left ABI: 1.17
Right ABI: 1.21

## 2024-08-25 ENCOUNTER — Ambulatory Visit: Admitting: Podiatry

## 2024-09-01 ENCOUNTER — Ambulatory Visit: Admitting: Podiatry
